# Patient Record
Sex: Male | Born: 1950 | Race: White | Hispanic: No | Marital: Married | Smoking: Former smoker
Health system: Southern US, Community
[De-identification: ages and names within clinical notes are randomized; demographics above are authoritative.]

## PROBLEM LIST (undated history)

## (undated) DIAGNOSIS — N281 Cyst of kidney, acquired: Secondary | ICD-10-CM

## (undated) DIAGNOSIS — G20A1 Parkinson's disease without dyskinesia, without mention of fluctuations: Secondary | ICD-10-CM

## (undated) DIAGNOSIS — D696 Thrombocytopenia, unspecified: Secondary | ICD-10-CM

## (undated) DIAGNOSIS — I499 Cardiac arrhythmia, unspecified: Secondary | ICD-10-CM

## (undated) DIAGNOSIS — I4891 Unspecified atrial fibrillation: Secondary | ICD-10-CM

## (undated) DIAGNOSIS — I82409 Acute embolism and thrombosis of unspecified deep veins of unspecified lower extremity: Secondary | ICD-10-CM

## (undated) DIAGNOSIS — R600 Localized edema: Secondary | ICD-10-CM

## (undated) DIAGNOSIS — I10 Essential (primary) hypertension: Secondary | ICD-10-CM

## (undated) DIAGNOSIS — R0600 Dyspnea, unspecified: Secondary | ICD-10-CM

## (undated) HISTORY — PX: HERNIA REPAIR: SHX51

## (undated) HISTORY — DX: Unspecified atrial fibrillation: I48.91

## (undated) HISTORY — PX: TONSILLECTOMY: SUR1361

## (undated) HISTORY — PX: COLONOSCOPY: SHX174

## (undated) HISTORY — PX: EYE SURGERY: SHX253

---

## 2009-08-03 ENCOUNTER — Ambulatory Visit: Payer: Self-pay | Admitting: Gastroenterology

## 2013-10-17 DIAGNOSIS — Z86718 Personal history of other venous thrombosis and embolism: Secondary | ICD-10-CM | POA: Insufficient documentation

## 2014-12-22 ENCOUNTER — Other Ambulatory Visit: Payer: Self-pay

## 2014-12-22 ENCOUNTER — Encounter: Payer: Self-pay | Admitting: General Surgery

## 2014-12-22 ENCOUNTER — Ambulatory Visit (INDEPENDENT_AMBULATORY_CARE_PROVIDER_SITE_OTHER): Payer: 59 | Admitting: General Surgery

## 2014-12-22 VITALS — BP 136/78 | HR 82 | Resp 14 | Ht 69.0 in | Wt 289.0 lb

## 2014-12-22 DIAGNOSIS — I482 Chronic atrial fibrillation, unspecified: Secondary | ICD-10-CM

## 2014-12-22 DIAGNOSIS — K4090 Unilateral inguinal hernia, without obstruction or gangrene, not specified as recurrent: Secondary | ICD-10-CM

## 2014-12-22 NOTE — Patient Instructions (Signed)

## 2014-12-22 NOTE — Progress Notes (Signed)
Patient ID: Neil Bailey, male   DOB: [birth year], 64 y.o.   MRN: [id number]  Chief Complaint  Patient presents with  . Other    right inguinal hernia    HPI Neil Bailey is a 64 y.o. male for evaluation of possible right inguinal hernia. He first noticed this about 2 months ago. He denies any incidents that may have caused this.  He just noticed that it has felt different in that area. He does report an ache in the area after sitting for a long period of time. He reports no problems with using the bathroom. He has recently gained weight. Last blood work was about 3 months ago.  HPI  Past Medical History  Diagnosis Date  . Atrial fibrillation     Past Surgical History  Procedure Laterality Date  . Eye surgery  05/2014, 06/2014    cataracts  . Colonoscopy  7-8 years ago    [facility]  . Tonsillectomy      History reviewed. No pertinent family history.  Social History History  Substance Use Topics  . Smoking status: Former Smoker -- 28 years    Quit date: 07/11/1986  . Smokeless tobacco: Not on file  . Alcohol Use: 0.0 oz/week    0 Standard drinks or equivalent per week    No Known Allergies  Current Outpatient Prescriptions  Medication Sig Dispense Refill  . aspirin EC 81 MG tablet Take 81 mg by mouth daily.     No current facility-administered medications for this visit.    Review of Systems Review of Systems  Constitutional: Negative.   Respiratory: Negative.   Cardiovascular: Negative.   Gastrointestinal: Negative.     Blood pressure 136/78, pulse 82, resp. rate 14, height 5\' 9"  (1.753 m), weight 289 lb (131.09 kg).  Physical Exam Physical Exam  Constitutional: He is oriented to person, place, and time. He appears well-developed and well-nourished.  Eyes: No scleral icterus.  Neck: Neck supple.  Cardiovascular: Normal rate and normal heart sounds.  An irregularly irregular rhythm present.  Pulmonary/Chest: Effort normal and breath sounds normal.   Abdominal: Soft. Bowel sounds are normal. He exhibits no distension and no mass. There is no hepatomegaly. There is no tenderness. There is no rebound and no guarding.  Can see fullness in right inguinal area, but no hernia palpated  Lymphadenopathy:    He has no cervical adenopathy.  Neurological: He is alert and oriented to person, place, and time.  Skin: Skin is warm and dry.    Data Reviewed Cardiology notes from 48yr ago.  Assessment    History suggestive of a hernia in right inguinal area, but hard to detect on exam, possibly due to patient's body habitus.  Rate controlled atrial fibrillation, not on anticoagulation.      Plan    CT scan of abdomen and pelvis without contrast to assess presence of hernia.   Hernia precautions and incarceration were discussed with the patient. If they develop symptoms of an incarcerated hernia, they were encouraged to seek prompt medical attention.Discussed repair of the hernia using mesh on an outpatient basis depending on CT results. The risk of infection was reviewed. The role of prosthetic mesh to minimize the risk of recurrence was reviewed. Patient instruction given.  Follow up TBA after CT scan    PCP: Dr Ivor Messier 12/22/2014, 2:30 PM

## 2014-12-26 ENCOUNTER — Ambulatory Visit
Admission: RE | Admit: 2014-12-26 | Discharge: 2014-12-26 | Disposition: A | Discharge status: Home / Self Care | Payer: 59 | Source: Ambulatory Visit | Attending: General Surgery | Admitting: General Surgery

## 2014-12-26 DIAGNOSIS — K4090 Unilateral inguinal hernia, without obstruction or gangrene, not specified as recurrent: Secondary | ICD-10-CM | POA: Insufficient documentation

## 2014-12-26 IMAGING — CT CT ABD-PELV W/O CM
2 of 4 series · 17 of 46 positions shown, 19 images · non-contrast
Comparison: None.

CLINICAL DATA: Right groin pain and pelvic pain. Right inguinal
hernia.

EXAM:
CT ABDOMEN AND PELVIS WITHOUT CONTRAST
TECHNIQUE: Multidetector CT imaging of the abdomen and pelvis was performed
following the standard protocol without IV contrast.

[Series 2: routine without · axial · non-contrast · 0.91mm/px · z∈[-1116,-626]mm · 14 of 108 slices shown, 16 images]
[im 5/108  soft-tissue]
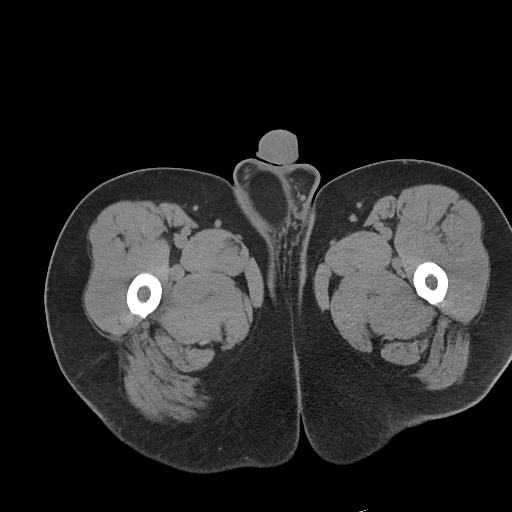
[im 5/108  bone]
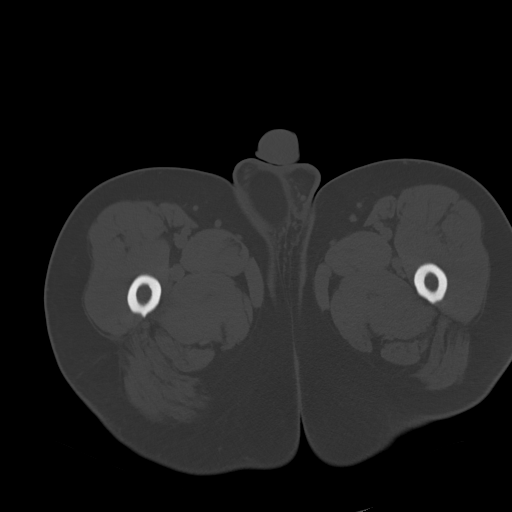
[im 14/108  soft-tissue]
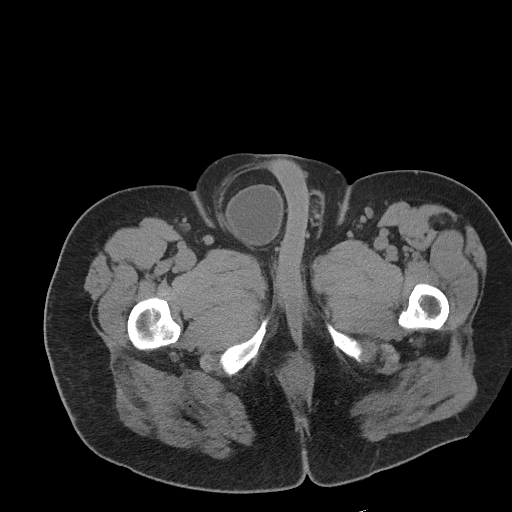
[im 23/108  soft-tissue]
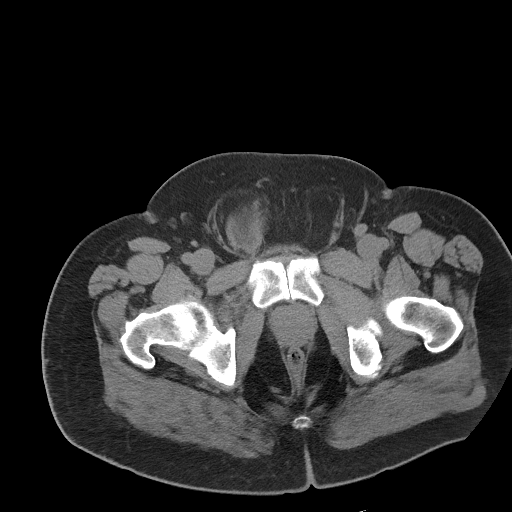
[im 27/108  soft-tissue]
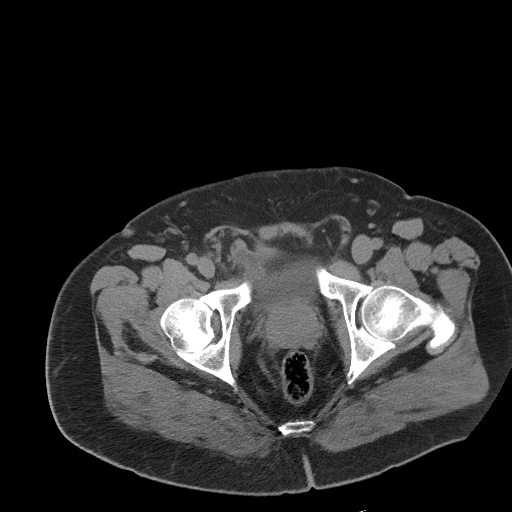
[im 36/108  soft-tissue]
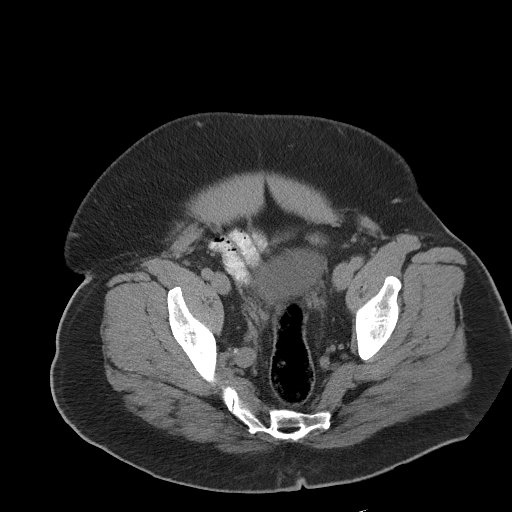
[im 45/108  soft-tissue]
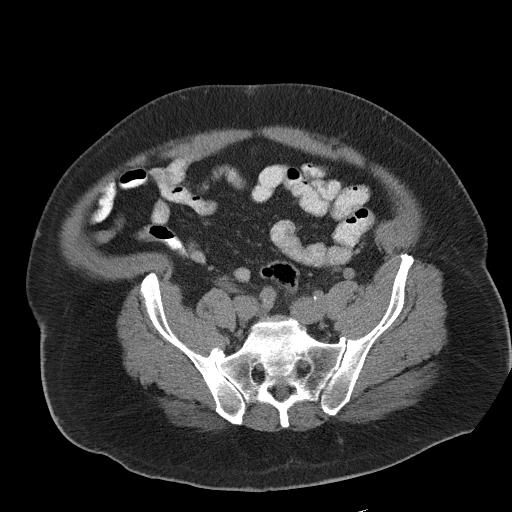
[im 50/108  soft-tissue]
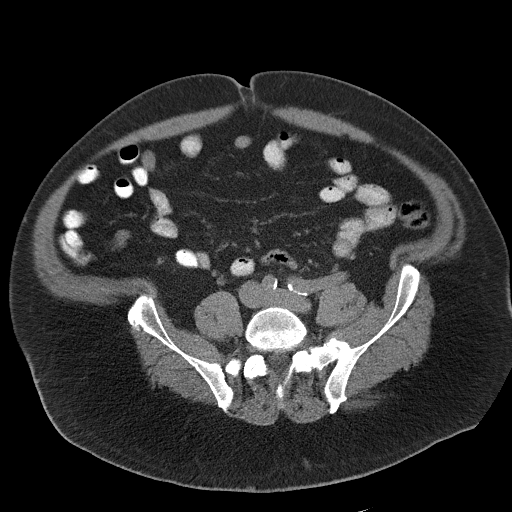
[im 58/108  soft-tissue]
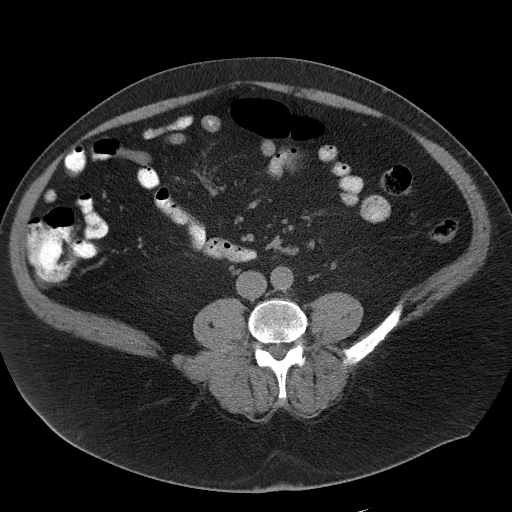
[im 63/108  soft-tissue]
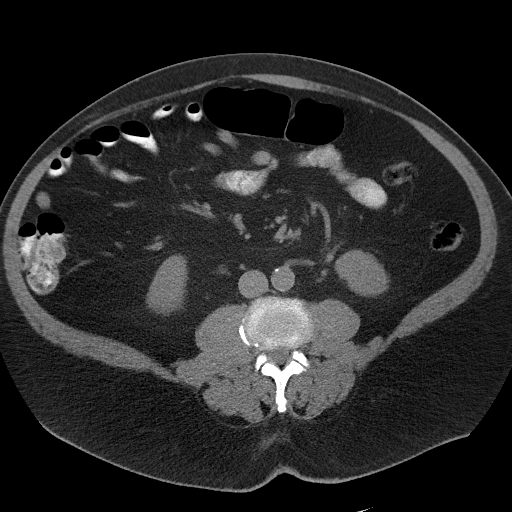
[im 63/108  bone]
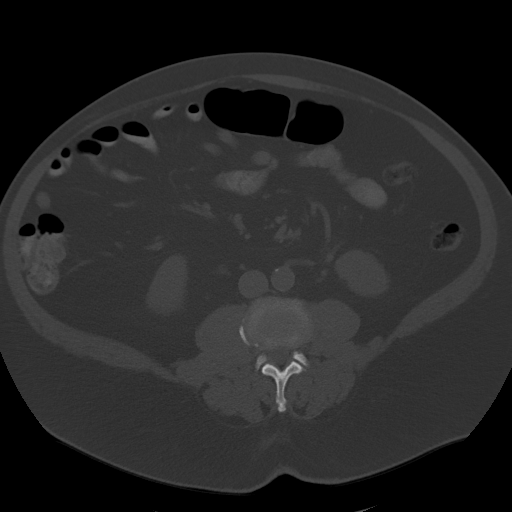
[im 72/108  soft-tissue]
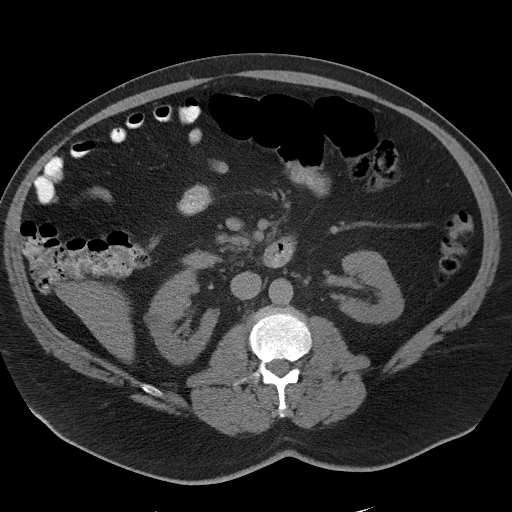
[im 81/108  soft-tissue]
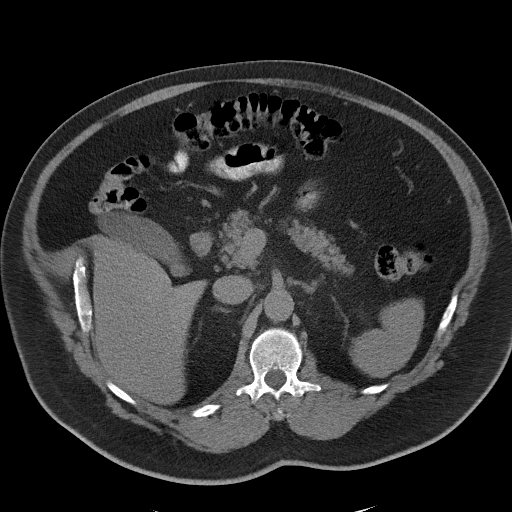
[im 85/108  soft-tissue]
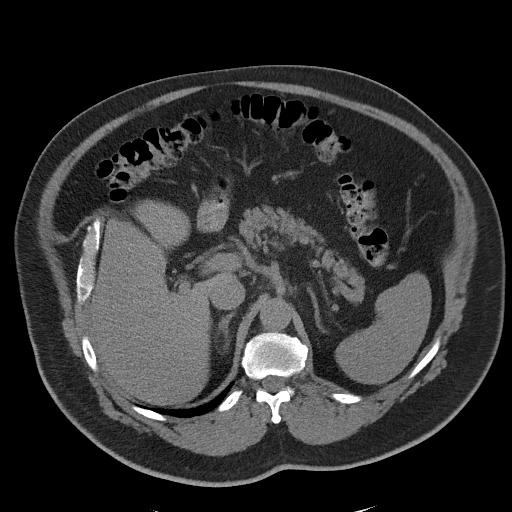
[im 94/108  soft-tissue]
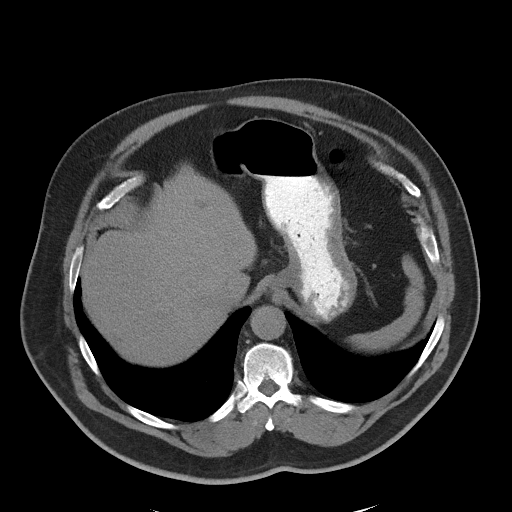
[im 103/108  soft-tissue]
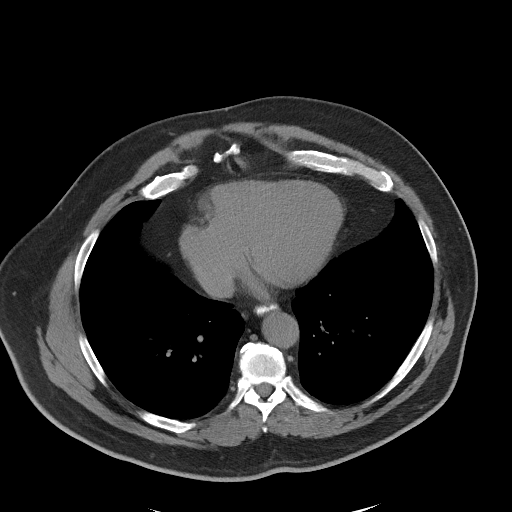

[Series 5: cor routine without · coronal · non-contrast · 0.97mm/px · 3 of 183 slices shown]
[im 61/183  soft-tissue]
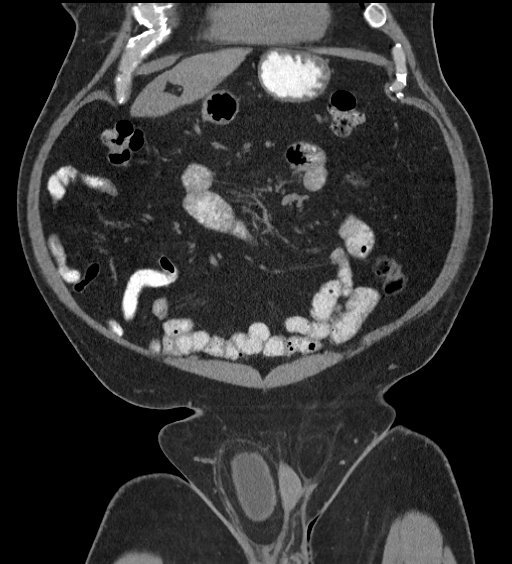
[im 81/183  soft-tissue]
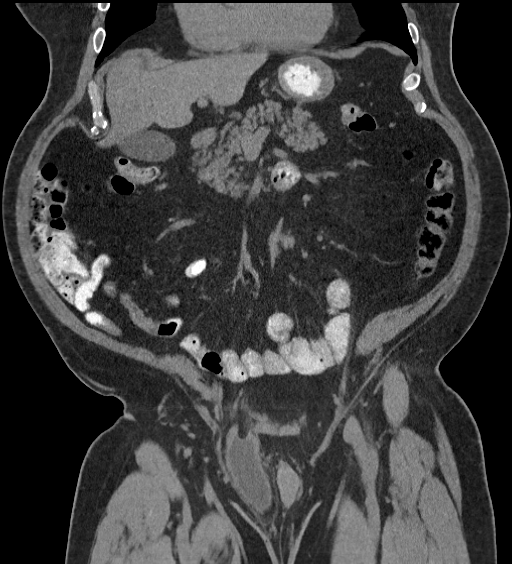
[im 102/183  soft-tissue]
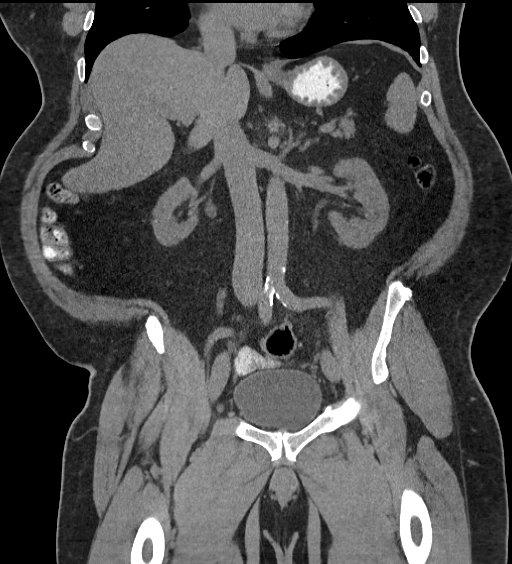

[17 of 46 positions shown; findings below may reference images not displayed]

FINDINGS: Lower chest: Single small blebs at the right lung base. Lung bases
are otherwise clear. Heart size is normal.

Hepatobiliary: Normal.

Pancreas: Normal.

Spleen: Normal.

Adrenals/Urinary Tract: Normal adrenal glands. 15 mm low-density
lesion in the lateral aspect of the mid right kidney consistent with
a cyst. The kidneys are otherwise normal. Ureters are normal. There
is a prominent right inguinal hernia containing the right anterior
lateral aspect of the bladder. The herniated component is 11 cm in
length. There is no bowel in the hernia sac.

Stomach/Bowel: Normal.

Vascular/Lymphatic: Scattered calcifications in the distal abdominal
aorta and iliac arteries.

Reproductive: Benign calcifications in the normal size prostate
gland.

Other: No free air or free fluid.

Musculoskeletal: No osseous abnormality.
IMPRESSION: Prominent right inguinal hernia containing the right anterior
lateral aspect of the bladder.

## 2014-12-30 ENCOUNTER — Telehealth: Payer: Self-pay

## 2014-12-30 NOTE — Telephone Encounter (Signed)
-----   Message from Christene Lye, MD sent at 12/30/2014  8:28 AM EDT ----- CT reviewed showing he has a right inguinal hernia. Need to see pt  to discuss findings and recommendation.

## 2014-12-30 NOTE — Telephone Encounter (Signed)
Notified patient as instructed, patient pleased. Discussed follow-up appointment for 01/04/15 at 3 pm, patient agrees.

## 2015-01-01 ENCOUNTER — Ambulatory Visit (INDEPENDENT_AMBULATORY_CARE_PROVIDER_SITE_OTHER): Payer: 59 | Admitting: General Surgery

## 2015-01-01 ENCOUNTER — Encounter: Payer: Self-pay | Admitting: General Surgery

## 2015-01-01 VITALS — BP 118/78 | HR 68 | Resp 14 | Ht 69.0 in | Wt 289.0 lb

## 2015-01-01 DIAGNOSIS — K4030 Unilateral inguinal hernia, with obstruction, without gangrene, not specified as recurrent: Secondary | ICD-10-CM | POA: Diagnosis not present

## 2015-01-01 NOTE — Progress Notes (Signed)
Patient ID: Neil Bailey, male   DOB: 21-May-1951, 64 y.o.   MRN: [id number] Patient here to discuss the results of his recent CT results showing a right inguinal hernia. He reports the area is unchanged from his last visit.   CT shows half of bladder going into the scrotum. There appears to be some narrowing of the bladder  Patient is experiencing frequent urination, but no other problems related to urination.   Hernia precautions and incarceration were discussed with the patient. If they develop symptoms of an incarcerated hernia, they were encouraged to seek prompt medical attention.  I have recommended repair of the hernia using mesh on an outpatient basis in the near future. The risk of infection was reviewed. The role of prosthetic mesh to minimize the risk of recurrence was reviewed. Patient agreeable.   This patient reports he had labs done through his employment back in March 2016. Patient has a copy of results at home. He was provided with a self addressed stamped envelope to mail copy of these labs to our office.   Cardiac clearance to be obtained from Dr. Saralyn Pilar before we schedule surgery. Patient will be contacted for scheduling once this has been obtained.   PCP:  Emily Filbert, MD

## 2015-01-01 NOTE — Patient Instructions (Addendum)
Open Hernia Repair Open hernia repair is surgery to fix a hernia. A hernia occurs when an internal organ or tissue pushes out through a weak spot in the abdominal wall muscles. Hernias commonly occur in the groin and around the navel. Most hernias tend to get worse over time. Surgery is often done to prevent the hernia from getting bigger, becoming uncomfortable, or becoming an emergency. Emergency surgery may be needed if abdominal contents get stuck in the opening (incarcerated hernia) or the blood supply gets cut off (strangulated hernia). In an open repair, a large cut (incision) is made in the abdomen to perform the surgery. LET [facility] CARE PROVIDER KNOW ABOUT:  Any allergies you have.  All medicines you are taking, including vitamins, herbs, eye drops, creams, and over-the-counter medicines.  Previous problems you or members of your family have had with the use of anesthetics.  Any blood disorders you have.  Previous surgeries you have had.  Medical conditions you have. RISKS AND COMPLICATIONS Generally, this is a safe procedure. However, as with any procedure, complications can occur. Possible complications include:  Infection.  Bleeding.  Nerve injury.  Chronic pain.  The hernia can come back.  Injury to the intestines. BEFORE THE PROCEDURE  Ask your health care provider about changing or stopping any regular medicines. Avoid taking aspirin or blood thinners as directed by your health care provider.  Do noteat or drink anything after midnight the night before surgery.  If you smoke, do not smoke for at least 2 weeks before your surgery.  Do not drink alcohol the day before your surgery.  Let your health care provider know if you develop a cold or any infection before your surgery.  Arrange for someone to drive you home after the procedure or after your hospital stay. Also arrange for someone to help you with activities during recovery. PROCEDURE   Small  monitors will be put on your body. They are used to check your heart, blood pressure, and oxygen level.   An IV access tube will be put into one of your veins. Medicine will be able to flow directly into your body through this IV tube.   You might be given a medicine to help you relax (sedative).   You will be given a medicine to make you sleep (general anesthetic). A breathing tube may be placed into your lungs during the procedure.  A cut (incision) is made over the hernia defect, and the contents are pushed back into the abdomen.  If the hernia is small, stitches may be used to bring the muscle edges back together.  Typically, a surgeon will place a mesh patch made of man-made material (synthetic) to cover the defect. The mesh is sewn to healthy muscle. This reduces the risk of the hernia coming back.  The tissue and skin over the hernia are then closed with stitches or staples.  If the hernia was large, a drain may be left in place to collect excess fluid where the hernia used to be.  Bandages (dressings) are used to cover the incision. AFTER THE PROCEDURE  You will be taken to a recovery area where your progress will be monitored.  If the hernia was small or in the groin (inguinal) region, you will likely be allowed to go home once you are awake, stable, and taking fluids well.  If the hernia was large, you may have to wait for your bowel function to return. You may need to stay in the hospital  for 2-3 days until you can eat and your pain is controlled. A drain may be left in place for 5-7 days. You will be taught how to care for the drain. Document Released: 12/21/2000 Document Revised: 04/17/2013 Document Reviewed: 02/06/2013 [facility] Patient Information 2015 [place], [place]. This information is not intended to replace advice given to you by your health care provider. Make sure you discuss any questions you have with your health care provider.  Please mail lab results back to  the office.

## 2015-01-07 ENCOUNTER — Telehealth: Payer: Self-pay | Admitting: *Deleted

## 2015-01-07 NOTE — Telephone Encounter (Signed)
Spoke with patient today to notify him that we have received cardiac clearance from Dr. Saralyn Pilar' office.   Patient's surgery has been scheduled for 01-19-15 at [facility]. It is okay for patient to continue 81 mg aspirin once daily.

## 2015-01-08 ENCOUNTER — Encounter: Payer: Self-pay | Admitting: General Surgery

## 2015-01-14 ENCOUNTER — Other Ambulatory Visit: Payer: Self-pay | Admitting: General Surgery

## 2015-01-14 ENCOUNTER — Encounter
Admission: RE | Admit: 2015-01-14 | Discharge: 2015-01-14 | Disposition: A | Discharge status: Home / Self Care | Payer: 59 | Source: Ambulatory Visit | Attending: General Surgery | Admitting: General Surgery

## 2015-01-14 DIAGNOSIS — K4030 Unilateral inguinal hernia, with obstruction, without gangrene, not specified as recurrent: Secondary | ICD-10-CM

## 2015-01-14 DIAGNOSIS — I4891 Unspecified atrial fibrillation: Secondary | ICD-10-CM | POA: Diagnosis not present

## 2015-01-14 HISTORY — DX: Acute embolism and thrombosis of unspecified deep veins of unspecified lower extremity: I82.409

## 2015-01-14 NOTE — Patient Instructions (Signed)
  Your procedure is scheduled on: 01-19-15 Report to [place] To find out your arrival time please call [phone] between 1PM - 3PM on 01-16-15 (FRIDAY)  Remember: Instructions that are not followed completely may result in serious medical risk, up to and including death, or upon the discretion of your surgeon and anesthesiologist your surgery may need to be rescheduled.    _X___ 1. Do not eat food or drink liquids after midnight. No gum chewing or hard candies.     _X___ 2. No Alcohol for 24 hours before or after surgery.   ____ 3. Bring all medications with you on the day of surgery if instructed.    __X__ 4. Notify your doctor if there is any change in your medical condition     (cold, fever, infections).     Do not wear jewelry, make-up, hairpins, clips or nail polish.  Do not wear lotions, powders, or perfumes. You may wear deodorant.  Do not shave 48 hours prior to surgery. Men may shave face and neck.  Do not bring valuables to the hospital.    [facility] is not responsible for any belongings or valuables.               Contacts, dentures or bridgework may not be worn into surgery.  Leave your suitcase in the car. After surgery it may be brought to your room.  For patients admitted to the hospital, discharge time is determined by your  treatment team.   Patients discharged the day of surgery will not be allowed to drive home.   Please read over the following fact sheets that you were given:     ____ Take these medicines the morning of surgery with A SIP OF WATER:    1. NONE  2.   3.   4.  5.  6.  ____ Fleet Enema (as directed)   _X___ Use CHG Soap as directed  ____ Use inhalers on the day of surgery  ____ Stop metformin 2 days prior to surgery    ____ Take 1/2 of usual insulin dose the night before surgery and none on the morning of surgery.   ____ Stop Coumadin/Plavix/aspirin-CHECK WITH DR Jamal Collin ABOUT ASPIRIN  ____  Stop Anti-inflammatories-NO NSAIDS OR ASPIRIN PRODUCTS   ____ Stop supplements until after surgery.    ____ Bring C-Pap to the hospital.

## 2015-01-16 DIAGNOSIS — Z6841 Body Mass Index (BMI) 40.0 and over, adult: Secondary | ICD-10-CM | POA: Diagnosis not present

## 2015-01-16 DIAGNOSIS — I4891 Unspecified atrial fibrillation: Secondary | ICD-10-CM | POA: Diagnosis not present

## 2015-01-16 DIAGNOSIS — I252 Old myocardial infarction: Secondary | ICD-10-CM | POA: Diagnosis not present

## 2015-01-16 DIAGNOSIS — Z7982 Long term (current) use of aspirin: Secondary | ICD-10-CM | POA: Diagnosis not present

## 2015-01-16 DIAGNOSIS — Z86718 Personal history of other venous thrombosis and embolism: Secondary | ICD-10-CM | POA: Diagnosis not present

## 2015-01-16 DIAGNOSIS — Z87891 Personal history of nicotine dependence: Secondary | ICD-10-CM | POA: Diagnosis not present

## 2015-01-16 DIAGNOSIS — K4090 Unilateral inguinal hernia, without obstruction or gangrene, not specified as recurrent: Secondary | ICD-10-CM | POA: Diagnosis present

## 2015-01-18 DIAGNOSIS — K4090 Unilateral inguinal hernia, without obstruction or gangrene, not specified as recurrent: Secondary | ICD-10-CM | POA: Diagnosis not present

## 2015-01-19 ENCOUNTER — Ambulatory Visit: Payer: 59 | Admitting: Anesthesiology

## 2015-01-19 ENCOUNTER — Encounter: Payer: Self-pay | Admitting: *Deleted

## 2015-01-19 ENCOUNTER — Ambulatory Visit
Admission: RE | Admit: 2015-01-19 | Discharge: 2015-01-19 | Disposition: A | Discharge status: Home / Self Care | Payer: 59 | Source: Ambulatory Visit | Attending: General Surgery | Admitting: General Surgery

## 2015-01-19 ENCOUNTER — Encounter
Admission: RE | Disposition: A | Discharge status: Home / Self Care | Payer: Self-pay | Source: Ambulatory Visit | Attending: General Surgery

## 2015-01-19 DIAGNOSIS — K4090 Unilateral inguinal hernia, without obstruction or gangrene, not specified as recurrent: Secondary | ICD-10-CM | POA: Insufficient documentation

## 2015-01-19 DIAGNOSIS — Z7982 Long term (current) use of aspirin: Secondary | ICD-10-CM | POA: Insufficient documentation

## 2015-01-19 DIAGNOSIS — I252 Old myocardial infarction: Secondary | ICD-10-CM | POA: Insufficient documentation

## 2015-01-19 DIAGNOSIS — Z6841 Body Mass Index (BMI) 40.0 and over, adult: Secondary | ICD-10-CM | POA: Insufficient documentation

## 2015-01-19 DIAGNOSIS — Z86718 Personal history of other venous thrombosis and embolism: Secondary | ICD-10-CM | POA: Insufficient documentation

## 2015-01-19 DIAGNOSIS — K4030 Unilateral inguinal hernia, with obstruction, without gangrene, not specified as recurrent: Secondary | ICD-10-CM

## 2015-01-19 DIAGNOSIS — I4891 Unspecified atrial fibrillation: Secondary | ICD-10-CM | POA: Insufficient documentation

## 2015-01-19 DIAGNOSIS — Z87891 Personal history of nicotine dependence: Secondary | ICD-10-CM | POA: Insufficient documentation

## 2015-01-19 HISTORY — PX: INGUINAL HERNIA REPAIR: SHX194

## 2015-01-19 SURGERY — REPAIR, HERNIA, INGUINAL, INCARCERATED
Anesthesia: General | Laterality: Right | Wound class: Clean

## 2015-01-19 MED ORDER — FENTANYL CITRATE (PF) 100 MCG/2ML IJ SOLN
25.0000 ug | INTRAMUSCULAR | Status: DC | PRN
  Administered 2015-01-19 (×4): 25 ug via INTRAVENOUS

## 2015-01-19 MED ORDER — LIDOCAINE HCL (CARDIAC) 20 MG/ML IV SOLN
INTRAVENOUS | Status: DC | PRN
  Administered 2015-01-19 (×2): 100 mg via INTRAVENOUS

## 2015-01-19 MED ORDER — OXYCODONE-ACETAMINOPHEN 5-325 MG PO TABS: 1.0000 | ORAL_TABLET | ORAL | Status: DC | PRN

## 2015-01-19 MED ORDER — BUPIVACAINE HCL (PF) 0.5 % IJ SOLN
INTRAMUSCULAR | Status: AC
  Filled 2015-01-19: qty 30

## 2015-01-19 MED ORDER — OXYCODONE HCL 5 MG PO TABS
ORAL_TABLET | ORAL | Status: AC
  Administered 2015-01-19: 5 mg via ORAL
  Filled 2015-01-19: qty 1

## 2015-01-19 MED ORDER — PROPOFOL 10 MG/ML IV BOLUS
INTRAVENOUS | Status: DC | PRN
  Administered 2015-01-19: 200 mg via INTRAVENOUS

## 2015-01-19 MED ORDER — FENTANYL CITRATE (PF) 100 MCG/2ML IJ SOLN
INTRAMUSCULAR | Status: DC | PRN
  Administered 2015-01-19: 250 ug via INTRAVENOUS

## 2015-01-19 MED ORDER — ONDANSETRON HCL 4 MG/2ML IJ SOLN
INTRAMUSCULAR | Status: DC | PRN
  Administered 2015-01-19: 4 mg via INTRAVENOUS

## 2015-01-19 MED ORDER — FAMOTIDINE 20 MG PO TABS
20.0000 mg | ORAL_TABLET | Freq: Once | ORAL | Status: AC
  Administered 2015-01-19: 20 mg via ORAL

## 2015-01-19 MED ORDER — OXYCODONE HCL 5 MG/5ML PO SOLN: 5.0000 mg | Freq: Once | ORAL | Status: AC | PRN

## 2015-01-19 MED ORDER — GLYCOPYRROLATE 0.2 MG/ML IJ SOLN
INTRAMUSCULAR | Status: DC | PRN
  Administered 2015-01-19: .5 mg via INTRAVENOUS

## 2015-01-19 MED ORDER — LIDOCAINE HCL (PF) 1 % IJ SOLN
INTRAMUSCULAR | Status: AC
  Filled 2015-01-19: qty 30

## 2015-01-19 MED ORDER — MIDAZOLAM HCL 2 MG/2ML IJ SOLN
INTRAMUSCULAR | Status: DC | PRN
  Administered 2015-01-19: 1 mg via INTRAVENOUS

## 2015-01-19 MED ORDER — OXYCODONE HCL 5 MG PO TABS
5.0000 mg | ORAL_TABLET | Freq: Once | ORAL | Status: AC | PRN
  Administered 2015-01-19: 5 mg via ORAL

## 2015-01-19 MED ORDER — NEOSTIGMINE METHYLSULFATE 10 MG/10ML IV SOLN
INTRAVENOUS | Status: DC | PRN
  Administered 2015-01-19: 3 mg via INTRAVENOUS

## 2015-01-19 MED ORDER — ROCURONIUM BROMIDE 100 MG/10ML IV SOLN
INTRAVENOUS | Status: DC | PRN
  Administered 2015-01-19: 10 mg via INTRAVENOUS
  Administered 2015-01-19: 40 mg via INTRAVENOUS

## 2015-01-19 MED ORDER — LACTATED RINGERS IV SOLN
INTRAVENOUS | Status: DC | PRN
  Administered 2015-01-19: 14:00:00 via INTRAVENOUS

## 2015-01-19 MED ORDER — CEFAZOLIN SODIUM-DEXTROSE 2-3 GM-% IV SOLR
INTRAVENOUS | Status: AC
  Administered 2015-01-19: 2 g via INTRAVENOUS
  Filled 2015-01-19: qty 50

## 2015-01-19 MED ORDER — CEFAZOLIN SODIUM-DEXTROSE 2-3 GM-% IV SOLR
2.0000 g | INTRAVENOUS | Status: AC
  Administered 2015-01-19: 2 g via INTRAVENOUS

## 2015-01-19 MED ORDER — ACETAMINOPHEN 10 MG/ML IV SOLN
INTRAVENOUS | Status: AC
  Filled 2015-01-19: qty 100

## 2015-01-19 MED ORDER — FAMOTIDINE 20 MG PO TABS
ORAL_TABLET | ORAL | Status: AC
  Administered 2015-01-19: 20 mg via ORAL
  Filled 2015-01-19: qty 1

## 2015-01-19 MED ORDER — ACETAMINOPHEN 10 MG/ML IV SOLN
INTRAVENOUS | Status: DC | PRN
  Administered 2015-01-19: 1000 mg via INTRAVENOUS

## 2015-01-19 MED ORDER — SUCCINYLCHOLINE CHLORIDE 20 MG/ML IJ SOLN
INTRAMUSCULAR | Status: DC | PRN
  Administered 2015-01-19: 120 mg via INTRAVENOUS

## 2015-01-19 MED ORDER — CHLORHEXIDINE GLUCONATE 4 % EX LIQD: 1.0000 "application " | Freq: Once | CUTANEOUS | Status: DC

## 2015-01-19 MED ORDER — FENTANYL CITRATE (PF) 100 MCG/2ML IJ SOLN
INTRAMUSCULAR | Status: AC
  Administered 2015-01-19: 25 ug via INTRAVENOUS
  Filled 2015-01-19: qty 2

## 2015-01-19 MED ORDER — LACTATED RINGERS IV SOLN
Freq: Once | INTRAVENOUS | Status: AC
  Administered 2015-01-19: 12:00:00 via INTRAVENOUS

## 2015-01-19 MED ORDER — LIDOCAINE HCL 1 % IJ SOLN
INTRAMUSCULAR | Status: DC | PRN
  Administered 2015-01-19: 20 mL via INTRAMUSCULAR

## 2015-01-19 MED ORDER — DILTIAZEM HCL 25 MG/5ML IV SOLN
INTRAVENOUS | Status: DC | PRN
  Administered 2015-01-19: 5 mg via INTRAVENOUS

## 2015-01-19 SURGICAL SUPPLY — 28 items
BLADE SURG 15 STRL SS SAFETY (BLADE) ×3 IMPLANT
CANISTER SUCT 1200ML W/VALVE (MISCELLANEOUS) ×3 IMPLANT
CHLORAPREP W/TINT 26ML (MISCELLANEOUS) ×3 IMPLANT
DECANTER SPIKE VIAL GLASS SM (MISCELLANEOUS) ×6 IMPLANT
DRAIN PENROSE 1/4X12 LTX (DRAIN) ×3 IMPLANT
DRAPE LAPAROTOMY 100X77 ABD (DRAPES) ×3 IMPLANT
GLOVE BIO SURGEON STRL SZ7 (GLOVE) ×21 IMPLANT
GOWN STRL REUS W/ TWL LRG LVL3 (GOWN DISPOSABLE) ×4 IMPLANT
GOWN STRL REUS W/TWL LRG LVL3 (GOWN DISPOSABLE) ×8
KIT RM TURNOVER STRD PROC AR (KITS) ×3 IMPLANT
LABEL OR SOLS (LABEL) ×3 IMPLANT
LIQUID BAND (GAUZE/BANDAGES/DRESSINGS) ×3 IMPLANT
MESH PARIETEX PROGRIP RIGHT (Mesh General) ×3 IMPLANT
NDL HPO THNWL 1X22GA REG BVL (NEEDLE) ×1 IMPLANT
NDL SAFETY 25GX1.5 (NEEDLE) IMPLANT
NEEDLE SAFETY 22GX1 (NEEDLE) ×2
NS IRRIG 500ML POUR BTL (IV SOLUTION) ×3 IMPLANT
PACK BASIN MINOR ARMC (MISCELLANEOUS) ×3 IMPLANT
PAD GROUND ADULT SPLIT (MISCELLANEOUS) ×3 IMPLANT
SUT PDS 2-0 27IN (SUTURE) IMPLANT
SUT VIC AB 2-0 SH 27 (SUTURE) ×2
SUT VIC AB 2-0 SH 27XBRD (SUTURE) ×1 IMPLANT
SUT VIC AB 3-0 54X BRD REEL (SUTURE) ×1 IMPLANT
SUT VIC AB 3-0 BRD 54 (SUTURE) ×2
SUT VIC AB 3-0 SH 27 (SUTURE) ×2
SUT VIC AB 3-0 SH 27X BRD (SUTURE) ×1 IMPLANT
SUT VIC AB 4-0 FS2 27 (SUTURE) ×3 IMPLANT
SYR CONTROL 10ML (SYRINGE) ×3 IMPLANT

## 2015-01-19 NOTE — Anesthesia Preprocedure Evaluation (Addendum)
Anesthesia Evaluation  Patient identified by MRN, date of birth, ID band Patient awake    Reviewed: Allergy & Precautions, H&P , NPO status , Patient's Chart, lab work & pertinent test results, reviewed documented beta blocker date and time   Airway Mallampati: III  TM Distance: >3 FB Neck ROM: full    Dental no notable dental hx. (+) Teeth Intact   Pulmonary former smoker,  breath sounds clear to auscultation  Pulmonary exam normal       Cardiovascular - Past MI Normal cardiovascular exam+ dysrhythmias Atrial Fibrillation Rhythm:regular Rate:Normal     Neuro/Psych negative neurological ROS  negative psych ROS   GI/Hepatic negative GI ROS, Neg liver ROS,   Endo/Other  negative endocrine ROS  Renal/GU negative Renal ROS  negative genitourinary   Musculoskeletal   Abdominal   Peds  Hematology negative hematology ROS (+)   Anesthesia Other Findings Past Medical History:   Atrial fibrillation                                          DVT (deep venous thrombosis)                                Morbid Obesity BMI 42  Reproductive/Obstetrics negative OB ROS                            Anesthesia Physical Anesthesia Plan  ASA: III  Anesthesia Plan: General ETT   Post-op Pain Management:    Induction:   Airway Management Planned:   Additional Equipment:   Intra-op Plan:   Post-operative Plan:   Informed Consent:   Plan Discussed with: Anesthesiologist, CRNA and Surgeon  Anesthesia Plan Comments:        Anesthesia Quick Evaluation

## 2015-01-19 NOTE — Op Note (Signed)
Preop diagnosis right inguinal hernia direct   Post op diagnosis: Same  Operation: Repair right inguinal hernia with mesh  Surgeon: S.G.Frida Wahlstrom  Assistant:     Anesthesia: Gen.  Complications: None  EBL: Minimal  Drains: None  Description: Patient was put to sleep in the supine position the operating table. The right groin area was prepped and draped sterile field. Timeout was performed. By prior CT evaluation the patient was noted to have a significant hernia the urinary bladder. An open repair was therefore planned. Skin incision was made along the inguinal canal and the medial portion. This was deepened through the layers down the external oblique and bleeding was controlled with cautery and ligatures of 3-0 Vicryl. The external oblique was opened along the line of its fibers. There was noted the patient had a very large fullness extending from the inguinal canal going through the external ring area. With careful dissection the hernia protrusion which was primarily a direct hernia was reduced from the scrotal area back and subsequently freed from the surrounding cord structures. After this was done the cord structures were inspected lipoma of the cord was identified which was excised and ligated with 3-0 Vicryl. The hernia containing the urinary bladder was then pushed back into the abdominal cavity through the posterior wall of the inguinal canal and this was held in place by reapproximating the transversalis fascia with a running 2-0 PDS stitch. After the hernia was satisfactorily reduced the posterior wall was reinforced with a Progrip mesh. This was placed around the cord and incorporated along the posterior wall. Medial and was tacked to the pubic tubercle with 2-0 PDS stitch. Wound was irrigated , the external oblique was closed with a running 2-0 PDS. The subcutaneous tissue was closed in 2 layers with 3-0 Vicryl 20 mL of Percent Marcaine was instilled for postop analgesia skin was  approximated with subcuticular 4-0 Vicryl reinforced with liqui  Ban. Patient subsequently was extubated and returned recovery room stable condition

## 2015-01-19 NOTE — Transfer of Care (Signed)
Immediate Anesthesia Transfer of Care Note  Patient: Neil Bailey  Procedure(s) Performed: Procedure(s): RIGHT INCARCERATED INGUINAL HERNIA REPAIR (Right)  Patient Location: PACU  Anesthesia Type:General  Level of Consciousness: awake, alert , oriented and patient cooperative  Airway & Oxygen Therapy: Patient Spontanous Breathing and Patient connected to nasal cannula oxygen  Post-op Assessment: Report given to RN and Post -op Vital signs reviewed and stable  Post vital signs: Reviewed and stable  Last Vitals:  Filed Vitals:   01/19/15 1541  BP: 135/86  Pulse: 96  Temp: 36.7 C  Resp:     Complications: No apparent anesthesia complications

## 2015-01-19 NOTE — Interval H&P Note (Signed)
History and Physical Interval Note:  01/19/2015 1:53 PM  Neil Bailey  has presented today for surgery, with the diagnosis of RIH WITH OBSTRUCTIONS  The various methods of treatment have been discussed with the patient and family. After consideration of risks, benefits and other options for treatment, the patient has consented to  Procedure(s): HERNIA REPAIR INGUINAL INCARCERATED (Right) as a surgical intervention .  The patient's history has been reviewed, patient examined, no change in status, stable for surgery.  I have reviewed the patient's chart and labs.  Questions were answered to the patient's satisfaction.     SANKAR,SEEPLAPUTHUR G

## 2015-01-19 NOTE — Discharge Instructions (Signed)
Inguinal Hernia, Adult  Care After Refer to this sheet in the next few weeks. These discharge instructions provide you with general information on caring for yourself after you leave the hospital. Your caregiver may also give you specific instructions. Your treatment has been planned according to the most current medical practices available, but unavoidable complications sometimes occur. If you have any problems or questions after discharge, please call your caregiver. HOME CARE INSTRUCTIONS  Put ice on the operative site.  Put ice in a plastic bag.  Place a towel between your skin and the bag.  Leave the ice on for 15-20 minutes at a time, 03-04 times a day while awake.  Change bandages (dressings) as directed.  Keep the wound dry and clean. The wound may be washed gently with soap and water. Gently blot or dab the wound dry. It is okay to take showers 24 to 48 hours after surgery. Do not take baths, use swimming pools, or use hot tubs for 10 days, or as directed by your caregiver.  Only take over-the-counter or prescription medicines for pain, discomfort, or fever as directed by your caregiver.  Continue your normal diet as directed.  Do not lift anything more than 10 pounds or play contact sports for 3 weeks, or as directed. SEEK MEDICAL CARE IF:  There is redness, swelling, or increasing pain in the wound.  There is fluid (pus) coming from the wound.  There is drainage from a wound lasting longer than 1 day.  You have an oral temperature above 102 F (38.9 C).  You notice a bad smell coming from the wound or dressing.  The wound breaks open after the stitches (sutures) have been removed.  You notice increasing pain in the shoulders (shoulder strap areas).  You develop dizzy episodes or fainting while standing.  You feel sick to your stomach (nauseous) or throw up (vomit). SEEK IMMEDIATE MEDICAL CARE IF:  You develop a rash.  You have difficulty breathing.  You  develop a reaction or have side effects to medicines you were given. MAKE SURE YOU:   Understand these instructions.  Will watch your condition.  Will get help right away if you are not doing well or get worse. Document Released: 07/28/2006 Document Revised: 09/19/2011 Document Reviewed: 05/27/2009 [facility] Patient Information 2015 [place], [place]. This information is not intended to replace advice given to you by your health care provider. Make sure you discuss any questions you have with your health care provider. AMBULATORY SURGERY  DISCHARGE INSTRUCTIONS   1) The drugs that you were given will stay in your system until tomorrow so for the next 24 hours you should not:  A) Drive an automobile B) Make any legal decisions C) Drink any alcoholic beverage   2) You may resume regular meals tomorrow.  Today it is better to start with liquids and gradually work up to solid foods.  You may eat anything you prefer, but it is better to start with liquids, then soup and crackers, and gradually work up to solid foods.   3) Please notify your doctor immediately if you have any unusual bleeding, trouble breathing, redness and pain at the surgery site, drainage, fever, or pain not relieved by medication.    4) Additional Instructions:    Please contact your physician with any problems or Same Day Surgery at [phone], Monday through Friday 6 am to 4 pm, or [place] [facility] number at [phone].

## 2015-01-19 NOTE — Anesthesia Postprocedure Evaluation (Signed)
  Anesthesia Post-op Note  Patient: Neil Bailey  Procedure(s) Performed: Procedure(s): RIGHT INCARCERATED INGUINAL HERNIA REPAIR (Right)  Anesthesia type:General ETT  Patient location: PACU  Post pain: Pain level controlled  Post assessment: Post-op Vital signs reviewed, Patient's Cardiovascular Status Stable, Respiratory Function Stable, Patent Airway and No signs of Nausea or vomiting  Post vital signs: Reviewed and stable  Last Vitals:  Filed Vitals:   01/19/15 1625  BP: 112/92  Pulse: 52  Temp:   Resp: 18    Level of consciousness: awake, alert  and patient cooperative  Complications: No apparent anesthesia complications

## 2015-01-19 NOTE — H&P (View-Only) (Signed)
Patient ID: Neil Bailey, male   DOB: [birth year], 64 y.o.   MRN: [id number]  Chief Complaint  Patient presents with  . Other    right inguinal hernia    HPI Neil Bailey is a 64 y.o. male for evaluation of possible right inguinal hernia. He first noticed this about 2 months ago. He denies any incidents that may have caused this.  He just noticed that it has felt different in that area. He does report an ache in the area after sitting for a long period of time. He reports no problems with using the bathroom. He has recently gained weight. Last blood work was about 3 months ago.  HPI  Past Medical History  Diagnosis Date  . Atrial fibrillation     Past Surgical History  Procedure Laterality Date  . Eye surgery  05/2014, 06/2014    cataracts  . Colonoscopy  7-8 years ago    [facility]  . Tonsillectomy      History reviewed. No pertinent family history.  Social History History  Substance Use Topics  . Smoking status: Former Smoker -- 28 years    Quit date: 07/11/1986  . Smokeless tobacco: Not on file  . Alcohol Use: 0.0 oz/week    0 Standard drinks or equivalent per week    No Known Allergies  Current Outpatient Prescriptions  Medication Sig Dispense Refill  . aspirin EC 81 MG tablet Take 81 mg by mouth daily.     No current facility-administered medications for this visit.    Review of Systems Review of Systems  Constitutional: Negative.   Respiratory: Negative.   Cardiovascular: Negative.   Gastrointestinal: Negative.     Blood pressure 136/78, pulse 82, resp. rate 14, height 5\' 9"  (1.753 m), weight 289 lb (131.09 kg).  Physical Exam Physical Exam  Constitutional: He is oriented to person, place, and time. He appears well-developed and well-nourished.  Eyes: No scleral icterus.  Neck: Neck supple.  Cardiovascular: Normal rate and normal heart sounds.  An irregularly irregular rhythm present.  Pulmonary/Chest: Effort normal and breath sounds normal.   Abdominal: Soft. Bowel sounds are normal. He exhibits no distension and no mass. There is no hepatomegaly. There is no tenderness. There is no rebound and no guarding.  Can see fullness in right inguinal area, but no hernia palpated  Lymphadenopathy:    He has no cervical adenopathy.  Neurological: He is alert and oriented to person, place, and time.  Skin: Skin is warm and dry.    Data Reviewed Cardiology notes from 66yr ago.  Assessment    History suggestive of a hernia in right inguinal area, but hard to detect on exam, possibly due to patient's body habitus.  Rate controlled atrial fibrillation, not on anticoagulation.      Plan    CT scan of abdomen and pelvis without contrast to assess presence of hernia.   Hernia precautions and incarceration were discussed with the patient. If they develop symptoms of an incarcerated hernia, they were encouraged to seek prompt medical attention.Discussed repair of the hernia using mesh on an outpatient basis depending on CT results. The risk of infection was reviewed. The role of prosthetic mesh to minimize the risk of recurrence was reviewed. Patient instruction given.  Follow up TBA after CT scan    PCP: Dr Ivor Messier 12/22/2014, 2:30 PM

## 2015-01-20 ENCOUNTER — Encounter: Payer: Self-pay | Admitting: General Surgery

## 2015-01-26 ENCOUNTER — Ambulatory Visit (INDEPENDENT_AMBULATORY_CARE_PROVIDER_SITE_OTHER): Payer: 59 | Admitting: General Surgery

## 2015-01-26 ENCOUNTER — Encounter: Payer: Self-pay | Admitting: General Surgery

## 2015-01-26 VITALS — BP 132/74 | HR 68 | Resp 14 | Ht 69.0 in | Wt 284.0 lb

## 2015-01-26 DIAGNOSIS — K4030 Unilateral inguinal hernia, with obstruction, without gangrene, not specified as recurrent: Secondary | ICD-10-CM

## 2015-01-26 NOTE — Progress Notes (Signed)
Patient ID: Neil Bailey, male   DOB: 06-19-1951, 64 y.o.   MRN: [id number] Here for a post op from a right inguinal hernia repair done on . He reports that he started having swelling of the scrotum on 01/22/15. He states that this has progressed and is now very swollen making it difficult to walk and very painful. He has been using ice to the area. He states that the incision area is only a little tender. He is using the bathroom without difficulty.  Moderate swelling extending into the scrotum likely from fluid in residual space after hernia was reduced. No sign of infection, incision looks clean and intact.  Pt advised. Asked that he elevate the scrotum on  towels when sitting or lying down. Also use of an athletic support may help     PCP: Dr Emily Filbert

## 2015-01-26 NOTE — Patient Instructions (Signed)
Wear an athletic support for comfort. Elevate the area continue using cold compresses.

## 2015-01-28 ENCOUNTER — Ambulatory Visit: Payer: 59 | Admitting: General Surgery

## 2015-02-03 ENCOUNTER — Encounter: Payer: Self-pay | Admitting: General Surgery

## 2015-02-03 ENCOUNTER — Ambulatory Visit (INDEPENDENT_AMBULATORY_CARE_PROVIDER_SITE_OTHER): Payer: 59 | Admitting: General Surgery

## 2015-02-03 VITALS — BP 118/72 | HR 64 | Resp 16 | Ht 69.0 in | Wt 287.0 lb

## 2015-02-03 DIAGNOSIS — K4030 Unilateral inguinal hernia, with obstruction, without gangrene, not specified as recurrent: Secondary | ICD-10-CM

## 2015-02-03 NOTE — Progress Notes (Signed)
This is a 64 year old male here today for his post op right inguinal hernia repair done on 01/19/15. Patient states he is doing well, he is just still swollen.  Abdomen soft, hernia repair intact. Moderate swelling in scrotum, no sign of infection. Pt reassured about the scrotal swelling. Hopefully will resolve on it's own  Follow up in 3 weeks. The patient is aware to call back for any questions or concerns.   PCP:  Emily Filbert

## 2015-02-03 NOTE — Patient Instructions (Addendum)
The patient is aware to call back for any questions or concerns. Follow up in 3 weeks. 

## 2015-02-25 ENCOUNTER — Encounter: Payer: Self-pay | Admitting: General Surgery

## 2015-02-25 ENCOUNTER — Ambulatory Visit (INDEPENDENT_AMBULATORY_CARE_PROVIDER_SITE_OTHER): Payer: 59 | Admitting: General Surgery

## 2015-02-25 VITALS — BP 120/80 | HR 77 | Resp 13 | Ht 69.0 in | Wt 283.0 lb

## 2015-02-25 DIAGNOSIS — K4030 Unilateral inguinal hernia, with obstruction, without gangrene, not specified as recurrent: Secondary | ICD-10-CM

## 2015-02-25 NOTE — Patient Instructions (Signed)
Call office in one month if swelling in the scrotum has not improved or sooner if symptoms worsen.  Follow up as needed.

## 2015-02-25 NOTE — Progress Notes (Signed)
Patient ID: Neil Bailey, male   DOB: January 26, 1951, 65 y.o.   MRN: [id number] This is a 64 year old male here today for his post op right inguinal hernia repair done on 01/19/15. Patient states he is doing well. He has returned to work and has not had any problems. Pain is controlled. He reports that he still has swelling in his scrotum.   Incision right inguinal site is clean and healed. Hernia repair is fully intact. Mild swelling is noted in the scrotum bilaterally, mostly anterior in the form of edema. In general, this is better than it was 4 weeks ago.  If symptoms do not improve in 4 weeks, recommend to see urologist.   PCP: Emily Filbert

## 2015-12-29 DIAGNOSIS — N509 Disorder of male genital organs, unspecified: Secondary | ICD-10-CM | POA: Diagnosis not present

## 2015-12-29 DIAGNOSIS — Z0000 Encounter for general adult medical examination without abnormal findings: Secondary | ICD-10-CM | POA: Diagnosis not present

## 2015-12-30 ENCOUNTER — Other Ambulatory Visit: Payer: Self-pay | Admitting: Internal Medicine

## 2015-12-30 DIAGNOSIS — N5089 Other specified disorders of the male genital organs: Secondary | ICD-10-CM

## 2016-01-28 ENCOUNTER — Ambulatory Visit
Admission: RE | Admit: 2016-01-28 | Discharge: 2016-01-28 | Disposition: A | Discharge status: Home / Self Care | Payer: Commercial Managed Care - HMO | Source: Ambulatory Visit | Attending: Internal Medicine | Admitting: Internal Medicine

## 2016-01-28 DIAGNOSIS — N509 Disorder of male genital organs, unspecified: Secondary | ICD-10-CM | POA: Diagnosis not present

## 2016-01-28 DIAGNOSIS — N433 Hydrocele, unspecified: Secondary | ICD-10-CM | POA: Insufficient documentation

## 2016-01-28 DIAGNOSIS — N5089 Other specified disorders of the male genital organs: Secondary | ICD-10-CM

## 2016-02-15 DIAGNOSIS — I480 Paroxysmal atrial fibrillation: Secondary | ICD-10-CM | POA: Diagnosis not present

## 2016-02-15 DIAGNOSIS — Z86718 Personal history of other venous thrombosis and embolism: Secondary | ICD-10-CM | POA: Diagnosis not present

## 2016-02-25 DIAGNOSIS — H26493 Other secondary cataract, bilateral: Secondary | ICD-10-CM | POA: Diagnosis not present

## 2016-02-25 DIAGNOSIS — Z961 Presence of intraocular lens: Secondary | ICD-10-CM | POA: Diagnosis not present

## 2016-09-01 DIAGNOSIS — I480 Paroxysmal atrial fibrillation: Secondary | ICD-10-CM | POA: Diagnosis not present

## 2016-09-01 DIAGNOSIS — Z86718 Personal history of other venous thrombosis and embolism: Secondary | ICD-10-CM | POA: Diagnosis not present

## 2016-10-18 DIAGNOSIS — S8011XA Contusion of right lower leg, initial encounter: Secondary | ICD-10-CM | POA: Diagnosis not present

## 2016-10-18 DIAGNOSIS — L0390 Cellulitis, unspecified: Secondary | ICD-10-CM | POA: Diagnosis not present

## 2016-11-23 DIAGNOSIS — I480 Paroxysmal atrial fibrillation: Secondary | ICD-10-CM | POA: Diagnosis not present

## 2016-11-30 DIAGNOSIS — Z5181 Encounter for therapeutic drug level monitoring: Secondary | ICD-10-CM | POA: Diagnosis not present

## 2016-11-30 DIAGNOSIS — Z86718 Personal history of other venous thrombosis and embolism: Secondary | ICD-10-CM | POA: Diagnosis not present

## 2016-11-30 DIAGNOSIS — Z7901 Long term (current) use of anticoagulants: Secondary | ICD-10-CM | POA: Diagnosis not present

## 2016-11-30 DIAGNOSIS — I480 Paroxysmal atrial fibrillation: Secondary | ICD-10-CM | POA: Diagnosis not present

## 2016-12-22 DIAGNOSIS — Z5181 Encounter for therapeutic drug level monitoring: Secondary | ICD-10-CM | POA: Diagnosis not present

## 2016-12-22 DIAGNOSIS — R739 Hyperglycemia, unspecified: Secondary | ICD-10-CM | POA: Diagnosis not present

## 2016-12-22 DIAGNOSIS — Z125 Encounter for screening for malignant neoplasm of prostate: Secondary | ICD-10-CM | POA: Diagnosis not present

## 2016-12-22 DIAGNOSIS — Z7901 Long term (current) use of anticoagulants: Secondary | ICD-10-CM | POA: Diagnosis not present

## 2016-12-22 DIAGNOSIS — Z0000 Encounter for general adult medical examination without abnormal findings: Secondary | ICD-10-CM | POA: Diagnosis not present

## 2016-12-22 DIAGNOSIS — Z79899 Other long term (current) drug therapy: Secondary | ICD-10-CM | POA: Diagnosis not present

## 2016-12-29 DIAGNOSIS — Z6841 Body Mass Index (BMI) 40.0 and over, adult: Secondary | ICD-10-CM | POA: Diagnosis not present

## 2016-12-29 DIAGNOSIS — R739 Hyperglycemia, unspecified: Secondary | ICD-10-CM | POA: Diagnosis not present

## 2016-12-29 DIAGNOSIS — Z0000 Encounter for general adult medical examination without abnormal findings: Secondary | ICD-10-CM | POA: Diagnosis not present

## 2017-01-18 DIAGNOSIS — Z7901 Long term (current) use of anticoagulants: Secondary | ICD-10-CM | POA: Diagnosis not present

## 2017-01-18 DIAGNOSIS — Z5181 Encounter for therapeutic drug level monitoring: Secondary | ICD-10-CM | POA: Diagnosis not present

## 2017-02-15 DIAGNOSIS — Z7901 Long term (current) use of anticoagulants: Secondary | ICD-10-CM | POA: Diagnosis not present

## 2017-02-15 DIAGNOSIS — Z5181 Encounter for therapeutic drug level monitoring: Secondary | ICD-10-CM | POA: Diagnosis not present

## 2017-03-01 DIAGNOSIS — Z961 Presence of intraocular lens: Secondary | ICD-10-CM | POA: Diagnosis not present

## 2017-03-01 DIAGNOSIS — H26493 Other secondary cataract, bilateral: Secondary | ICD-10-CM | POA: Diagnosis not present

## 2017-03-20 DIAGNOSIS — Z7901 Long term (current) use of anticoagulants: Secondary | ICD-10-CM | POA: Diagnosis not present

## 2017-03-20 DIAGNOSIS — Z5181 Encounter for therapeutic drug level monitoring: Secondary | ICD-10-CM | POA: Diagnosis not present

## 2017-03-20 DIAGNOSIS — Z0000 Encounter for general adult medical examination without abnormal findings: Secondary | ICD-10-CM | POA: Insufficient documentation

## 2017-03-22 IMAGING — US US ART/VEN ABD/PELV/SCROTUM DOPPLER LTD
1 series · 14 of 25 positions shown · non-contrast
Comparison: None.

CLINICAL DATA: Bilateral testicular swelling since right inguinal
hernia repair 6 months ago.

EXAM:
SCROTAL ULTRASOUND
DOPPLER ULTRASOUND OF THE TESTICLES
TECHNIQUE: Complete ultrasound examination of the testicles, epididymis, and
other scrotal structures was performed. Color and spectral Doppler
ultrasound were also utilized to evaluate blood flow to the
testicles.

[Series 1: us art/ven abd/pelv/scrotum doppler ltd · 0.09mm/px · 14 of 87 slices shown]
[im 1/87]
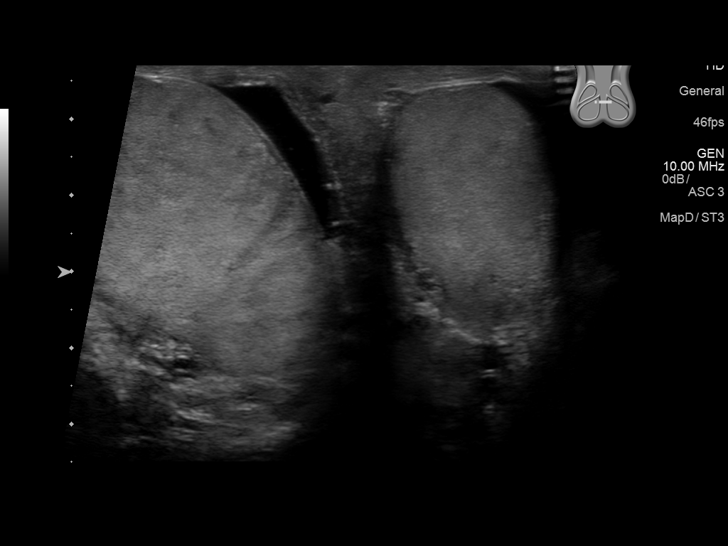
[im 8/87]
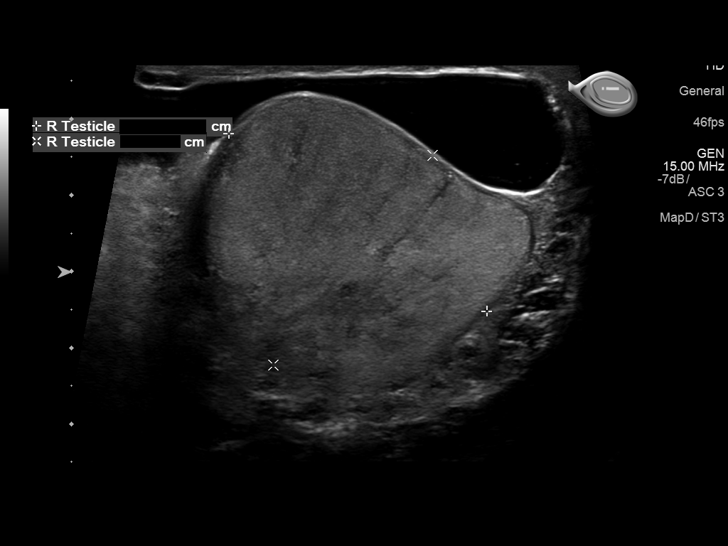
[im 15/87]
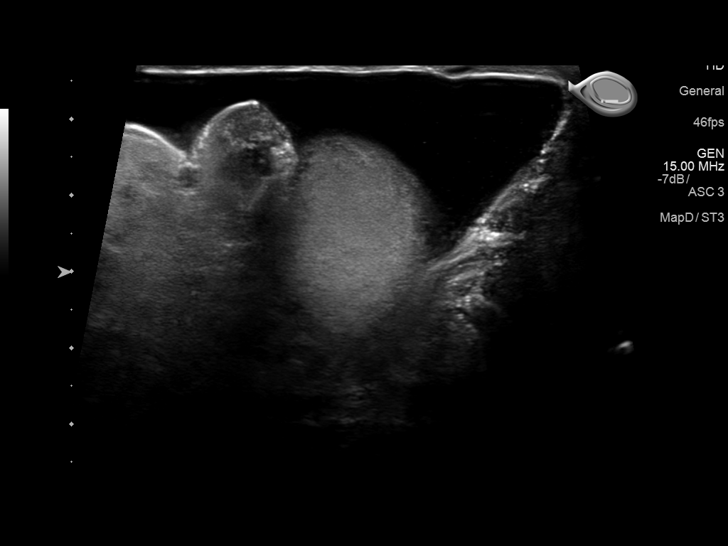
[im 22/87]
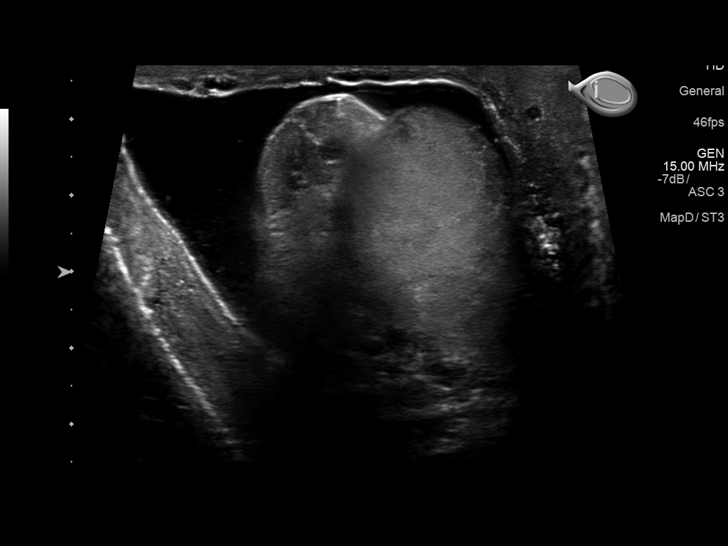
[im 29/87]
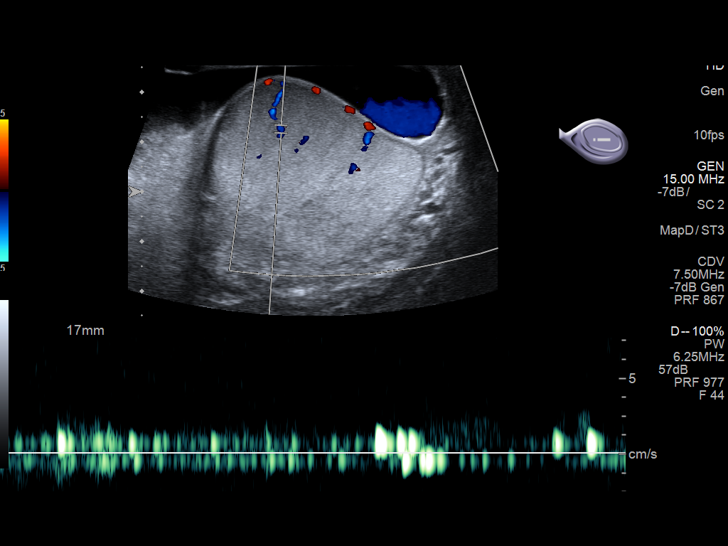
[im 33/87]
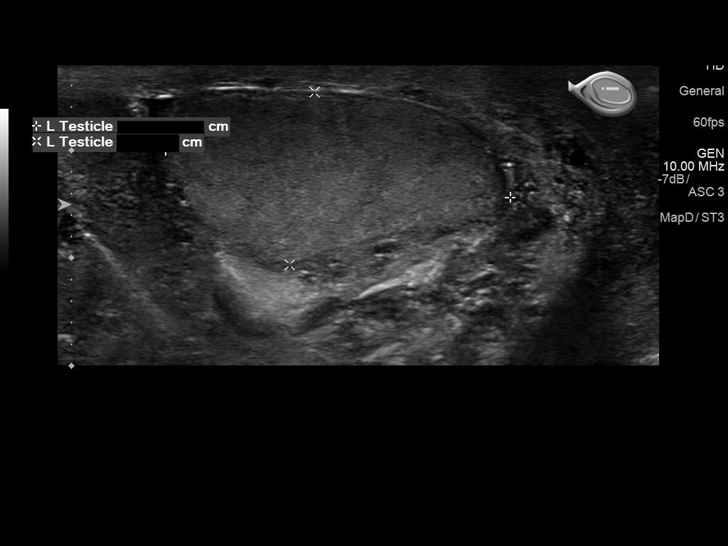
[im 40/87]
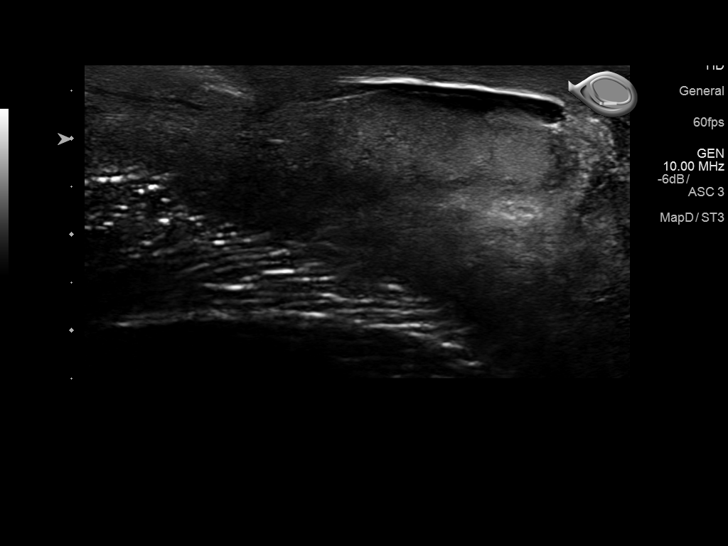
[im 47/87]
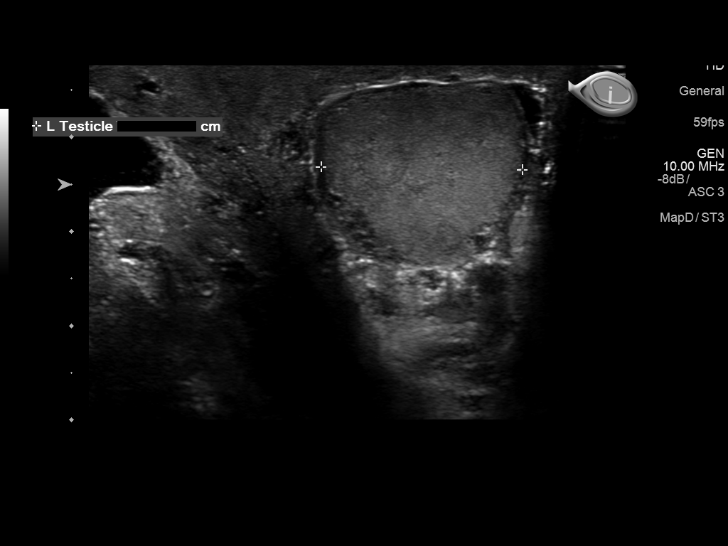
[im 54/87]
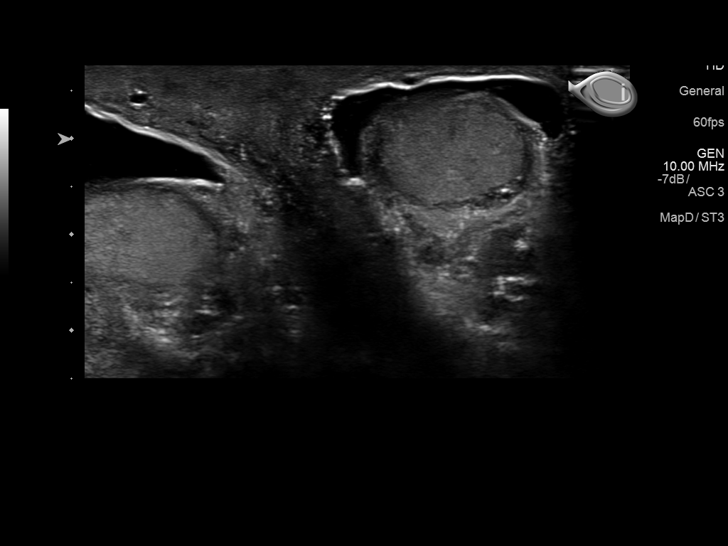
[im 58/87]
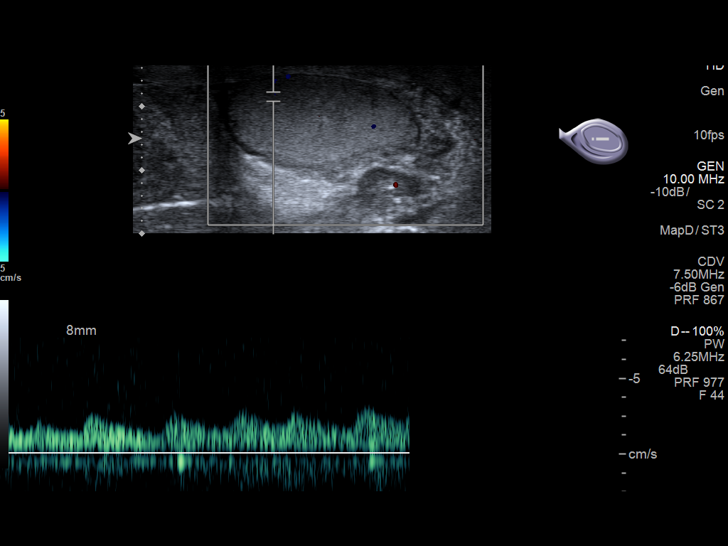
[im 65/87]
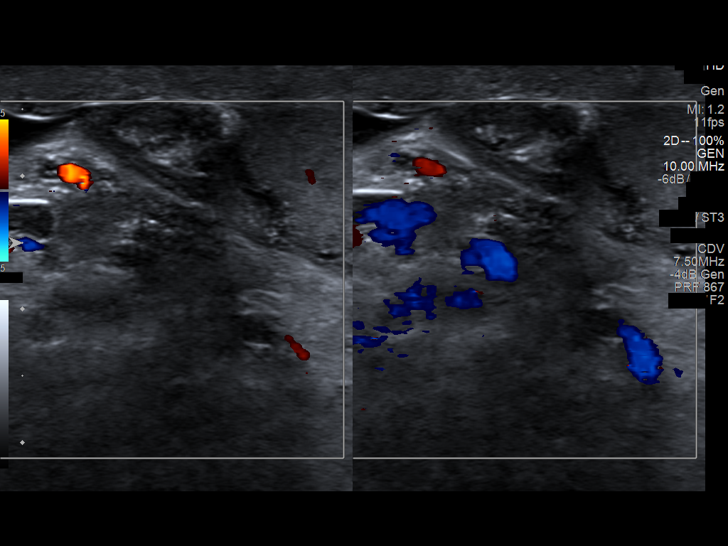
[im 72/87]
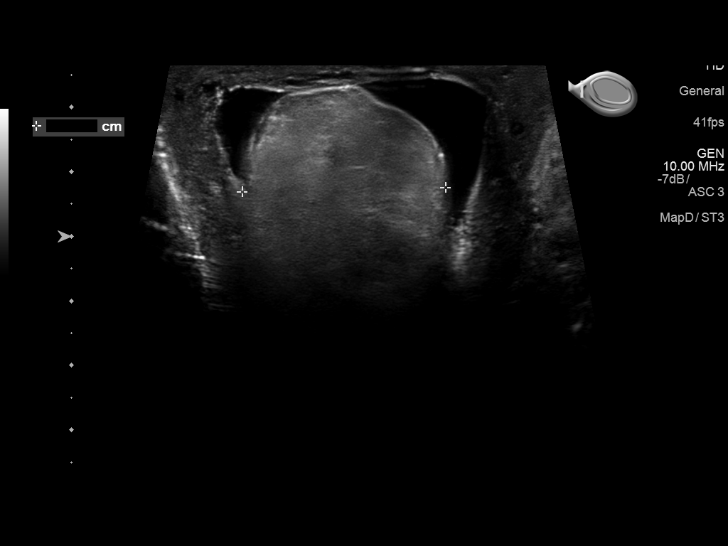
[im 79/87]
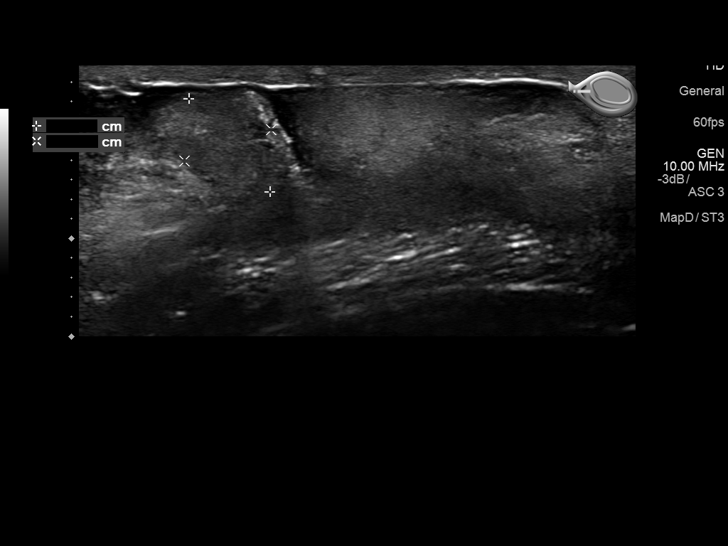
[im 87/87]
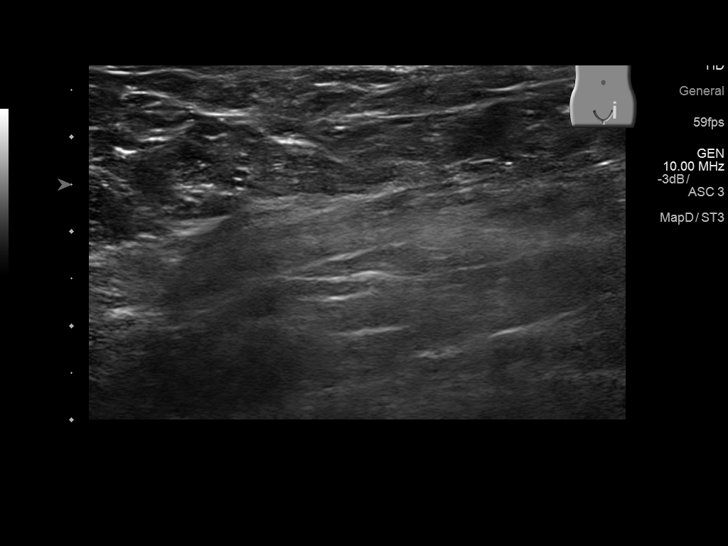

[14 of 25 positions shown; findings below may reference images not displayed]

FINDINGS: Right testicle

Measurements: 3.5 x 2.3 x 4.1 cm. No mass or microlithiasis
visualized.

Left testicle

Measurements: 1.6 x 2.1 x 3.2 cm. No mass or microlithiasis
visualized.

Right epididymis: Mildly enlarged without focal mass or
hypervascularity.

Left epididymis:  Normal in size and appearance.

Hydrocele: Moderate size simple right hydrocele and trace amount of
fluid within the left hemiscrotum.

Varicocele:  None visualized.

Pulsed Doppler interrogation of both testes demonstrates normal low
resistance arterial and venous waveforms bilaterally.
IMPRESSION: Normal testicles without evidence of torsion.

Moderate size simple right hydrocele and trace left hydrocele.

## 2017-04-17 DIAGNOSIS — Z5181 Encounter for therapeutic drug level monitoring: Secondary | ICD-10-CM | POA: Diagnosis not present

## 2017-04-17 DIAGNOSIS — Z7901 Long term (current) use of anticoagulants: Secondary | ICD-10-CM | POA: Diagnosis not present

## 2017-05-15 DIAGNOSIS — Z7901 Long term (current) use of anticoagulants: Secondary | ICD-10-CM | POA: Diagnosis not present

## 2017-05-15 DIAGNOSIS — Z5181 Encounter for therapeutic drug level monitoring: Secondary | ICD-10-CM | POA: Diagnosis not present

## 2017-05-30 DIAGNOSIS — I480 Paroxysmal atrial fibrillation: Secondary | ICD-10-CM | POA: Diagnosis not present

## 2017-05-30 DIAGNOSIS — Z86718 Personal history of other venous thrombosis and embolism: Secondary | ICD-10-CM | POA: Diagnosis not present

## 2017-06-12 DIAGNOSIS — Z5181 Encounter for therapeutic drug level monitoring: Secondary | ICD-10-CM | POA: Diagnosis not present

## 2017-06-12 DIAGNOSIS — Z7901 Long term (current) use of anticoagulants: Secondary | ICD-10-CM | POA: Diagnosis not present

## 2017-07-10 DIAGNOSIS — Z5181 Encounter for therapeutic drug level monitoring: Secondary | ICD-10-CM | POA: Diagnosis not present

## 2017-07-10 DIAGNOSIS — Z7901 Long term (current) use of anticoagulants: Secondary | ICD-10-CM | POA: Diagnosis not present

## 2019-06-11 DIAGNOSIS — U071 COVID-19: Secondary | ICD-10-CM

## 2019-06-11 HISTORY — DX: COVID-19: U07.1

## 2019-06-30 ENCOUNTER — Emergency Department: Payer: Medicare HMO

## 2019-06-30 ENCOUNTER — Encounter: Payer: Self-pay | Admitting: Emergency Medicine

## 2019-06-30 ENCOUNTER — Other Ambulatory Visit: Payer: Self-pay

## 2019-06-30 ENCOUNTER — Inpatient Hospital Stay
Admission: EM | Admit: 2019-06-30 | Discharge: 2019-07-04 | DRG: 177 | Disposition: A | Discharge status: Other Acute Inpatient Hospital | Payer: Medicare HMO | Attending: Hospitalist | Admitting: Hospitalist

## 2019-06-30 DIAGNOSIS — Z7901 Long term (current) use of anticoagulants: Secondary | ICD-10-CM | POA: Diagnosis not present

## 2019-06-30 DIAGNOSIS — Z87891 Personal history of nicotine dependence: Secondary | ICD-10-CM | POA: Diagnosis not present

## 2019-06-30 DIAGNOSIS — U071 COVID-19: Secondary | ICD-10-CM

## 2019-06-30 DIAGNOSIS — G92 Toxic encephalopathy: Secondary | ICD-10-CM | POA: Diagnosis present

## 2019-06-30 DIAGNOSIS — R000 Tachycardia, unspecified: Secondary | ICD-10-CM | POA: Diagnosis not present

## 2019-06-30 DIAGNOSIS — Z79899 Other long term (current) drug therapy: Secondary | ICD-10-CM | POA: Diagnosis not present

## 2019-06-30 DIAGNOSIS — E878 Other disorders of electrolyte and fluid balance, not elsewhere classified: Secondary | ICD-10-CM | POA: Diagnosis present

## 2019-06-30 DIAGNOSIS — E871 Hypo-osmolality and hyponatremia: Secondary | ICD-10-CM | POA: Diagnosis present

## 2019-06-30 DIAGNOSIS — J189 Pneumonia, unspecified organism: Secondary | ICD-10-CM | POA: Diagnosis present

## 2019-06-30 DIAGNOSIS — R4182 Altered mental status, unspecified: Secondary | ICD-10-CM | POA: Diagnosis not present

## 2019-06-30 DIAGNOSIS — J1289 Other viral pneumonia: Secondary | ICD-10-CM | POA: Diagnosis present

## 2019-06-30 DIAGNOSIS — Z86718 Personal history of other venous thrombosis and embolism: Secondary | ICD-10-CM | POA: Diagnosis not present

## 2019-06-30 DIAGNOSIS — W19XXXA Unspecified fall, initial encounter: Secondary | ICD-10-CM | POA: Diagnosis present

## 2019-06-30 DIAGNOSIS — J9601 Acute respiratory failure with hypoxia: Secondary | ICD-10-CM | POA: Diagnosis present

## 2019-06-30 DIAGNOSIS — R55 Syncope and collapse: Secondary | ICD-10-CM | POA: Diagnosis not present

## 2019-06-30 DIAGNOSIS — I4891 Unspecified atrial fibrillation: Secondary | ICD-10-CM

## 2019-06-30 DIAGNOSIS — N179 Acute kidney failure, unspecified: Secondary | ICD-10-CM | POA: Diagnosis present

## 2019-06-30 DIAGNOSIS — J159 Unspecified bacterial pneumonia: Secondary | ICD-10-CM | POA: Diagnosis present

## 2019-06-30 DIAGNOSIS — G9349 Other encephalopathy: Secondary | ICD-10-CM | POA: Diagnosis not present

## 2019-06-30 DIAGNOSIS — E860 Dehydration: Secondary | ICD-10-CM | POA: Diagnosis present

## 2019-06-30 DIAGNOSIS — D72819 Decreased white blood cell count, unspecified: Secondary | ICD-10-CM | POA: Diagnosis present

## 2019-06-30 DIAGNOSIS — R791 Abnormal coagulation profile: Secondary | ICD-10-CM | POA: Diagnosis not present

## 2019-06-30 DIAGNOSIS — A419 Sepsis, unspecified organism: Secondary | ICD-10-CM | POA: Diagnosis not present

## 2019-06-30 DIAGNOSIS — I824Y9 Acute embolism and thrombosis of unspecified deep veins of unspecified proximal lower extremity: Secondary | ICD-10-CM | POA: Diagnosis not present

## 2019-06-30 DIAGNOSIS — D6959 Other secondary thrombocytopenia: Secondary | ICD-10-CM | POA: Diagnosis present

## 2019-06-30 DIAGNOSIS — I482 Chronic atrial fibrillation, unspecified: Secondary | ICD-10-CM | POA: Diagnosis present

## 2019-06-30 HISTORY — DX: COVID-19: U07.1

## 2019-06-30 LAB — FIBRIN DERIVATIVES D-DIMER (ARMC ONLY): Fibrin derivatives D-dimer (ARMC): 668.6 ng/mL (FEU) — ABNORMAL HIGH (ref 0.00–499.00)

## 2019-06-30 LAB — CBC
HCT: 44.4 % (ref 39.0–52.0)
Hemoglobin: 14.7 g/dL (ref 13.0–17.0)
MCH: 28.8 pg (ref 26.0–34.0)
MCHC: 33.1 g/dL (ref 30.0–36.0)
MCV: 86.9 fL (ref 80.0–100.0)
Platelets: 50 10*3/uL — ABNORMAL LOW (ref 150–400)
RBC: 5.11 MIL/uL (ref 4.22–5.81)
RDW: 13.9 % (ref 11.5–15.5)
WBC: 5 10*3/uL (ref 4.0–10.5)
nRBC: 0 % (ref 0.0–0.2)

## 2019-06-30 LAB — MAGNESIUM: Magnesium: 2 mg/dL (ref 1.7–2.4)

## 2019-06-30 LAB — URINALYSIS, COMPLETE (UACMP) WITH MICROSCOPIC
Bacteria, UA: NONE SEEN
Bilirubin Urine: NEGATIVE
Glucose, UA: NEGATIVE mg/dL
Ketones, ur: 5 mg/dL — AB
Leukocytes,Ua: NEGATIVE
Nitrite: NEGATIVE
Protein, ur: 100 mg/dL — AB
Specific Gravity, Urine: 1.021 (ref 1.005–1.030)
pH: 5 (ref 5.0–8.0)

## 2019-06-30 LAB — BASIC METABOLIC PANEL
Anion gap: 10 (ref 5–15)
BUN: 22 mg/dL (ref 8–23)
CO2: 29 mmol/L (ref 22–32)
Calcium: 8.1 mg/dL — ABNORMAL LOW (ref 8.9–10.3)
Chloride: 95 mmol/L — ABNORMAL LOW (ref 98–111)
Creatinine, Ser: 1.4 mg/dL — ABNORMAL HIGH (ref 0.61–1.24)
GFR calc Af Amer: 59 mL/min — ABNORMAL LOW (ref >60)
GFR calc non Af Amer: 51 mL/min — ABNORMAL LOW (ref >60)
Glucose, Bld: 108 mg/dL — ABNORMAL HIGH (ref 70–99)
Potassium: 3.5 mmol/L (ref 3.5–5.1)
Sodium: 134 mmol/L — ABNORMAL LOW (ref 135–145)

## 2019-06-30 LAB — TROPONIN I (HIGH SENSITIVITY)
Troponin I (High Sensitivity): 13 ng/L (ref <18)
Troponin I (High Sensitivity): 16 ng/L (ref <18)

## 2019-06-30 LAB — FERRITIN: Ferritin: 202 ng/mL (ref 24–336)

## 2019-06-30 LAB — GLUCOSE, CAPILLARY: Glucose-Capillary: 95 mg/dL (ref 70–99)

## 2019-06-30 LAB — PROTIME-INR
INR: 1.4 — ABNORMAL HIGH (ref 0.8–1.2)
Prothrombin Time: 17.3 seconds — ABNORMAL HIGH (ref 11.4–15.2)

## 2019-06-30 LAB — LACTIC ACID, PLASMA
Lactic Acid, Venous: 1.6 mmol/L (ref 0.5–1.9)
Lactic Acid, Venous: 1.1 mmol/L (ref 0.5–1.9)

## 2019-06-30 LAB — POC SARS CORONAVIRUS 2 AG: SARS Coronavirus 2 Ag: NEGATIVE

## 2019-06-30 LAB — LACTATE DEHYDROGENASE: LDH: 237 U/L — ABNORMAL HIGH (ref 98–192)

## 2019-06-30 IMAGING — DX DG CHEST 1V PORT
1 series · 1 of 1 positions shown · non-contrast
Comparison: Abdominal CT [DATE]. No previous chest radiographs
available.

CLINICAL DATA: Near syncopal episode. On antibiotics for cough.

EXAM:
PORTABLE CHEST 1 VIEW

[chest ap]
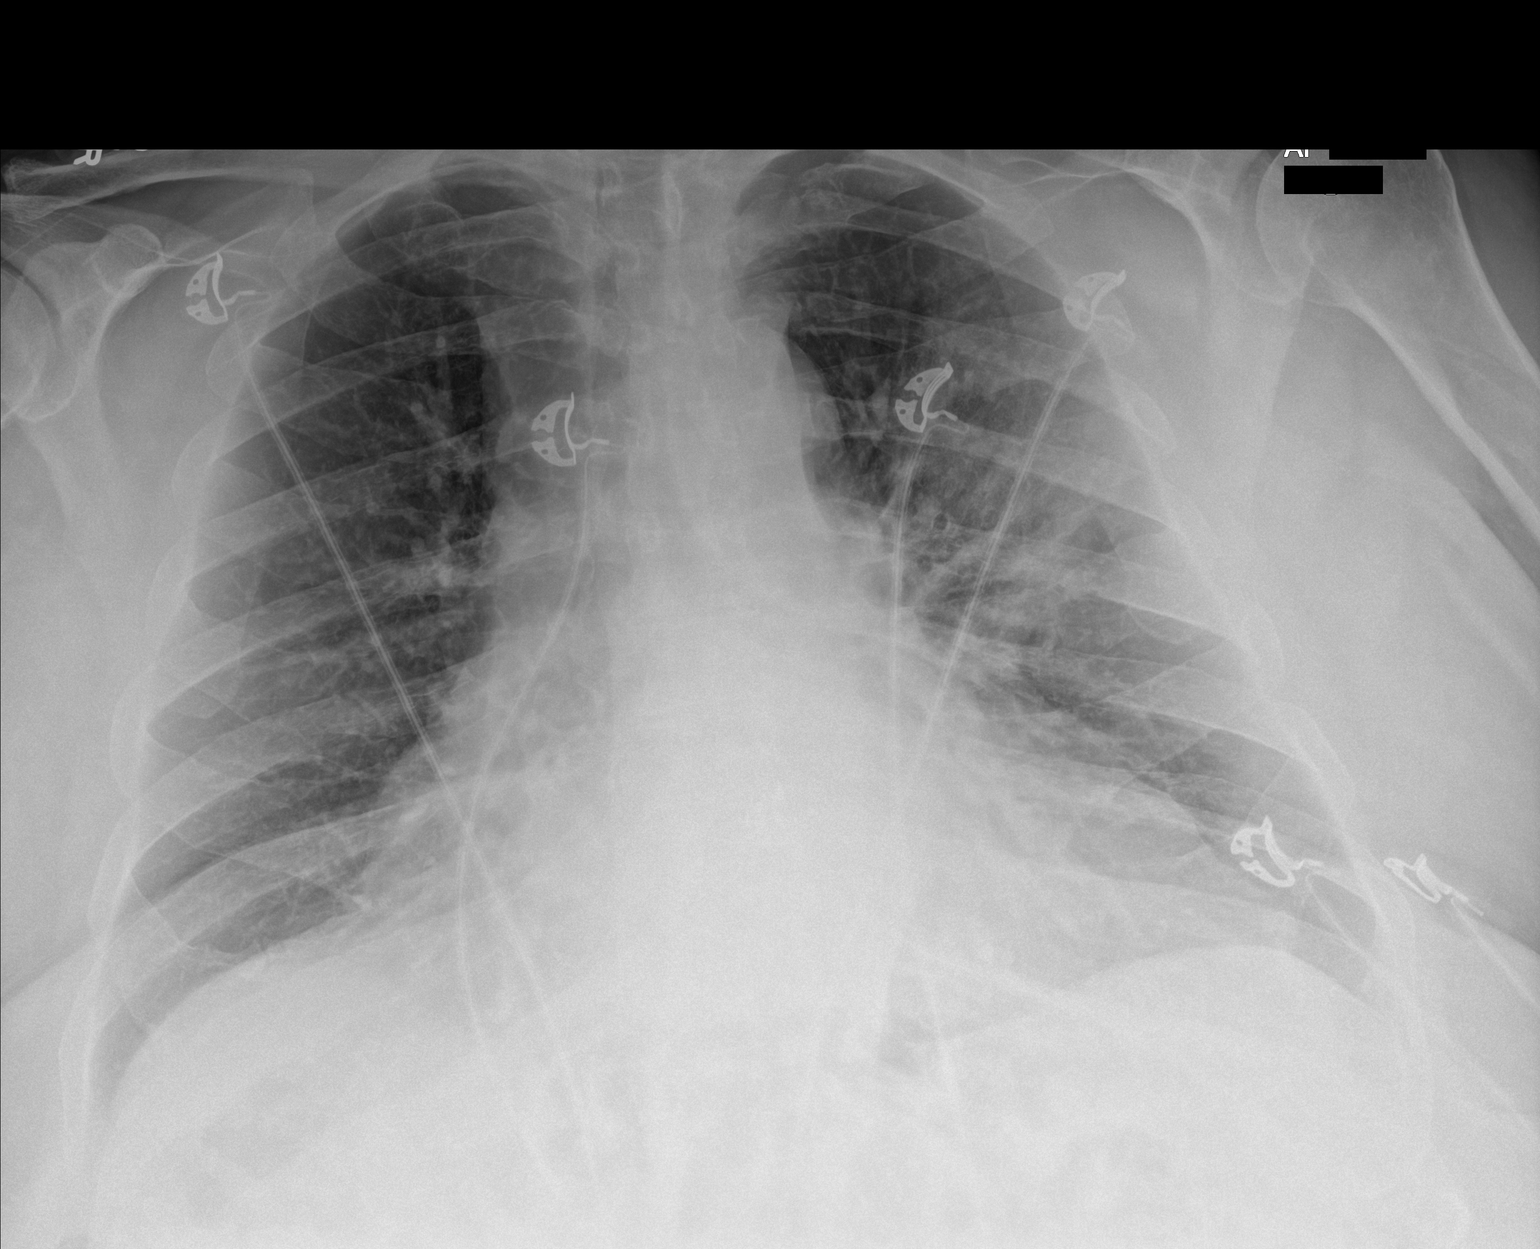

[1 of 1 positions shown; findings below may reference images not displayed]

FINDINGS: [EK] hours. Stable mild cardiomegaly exaggerated by prominent
epicardial fat as correlated with previous CT. There is patchy
airspace disease in the left perihilar region which could reflect
early pneumonia. The right lung appears clear. There is no edema,
pneumothorax or significant pleural effusion. The bones appear
unremarkable. Telemetry leads overlie the chest.
IMPRESSION: 1. Patchy airspace disease in the left perihilar region suspicious
for early pneumonia. Followup PA and lateral chest X-ray is
recommended in 3-4 weeks following completion of antibiotic therapy
to ensure resolution and exclude underlying malignancy.
2. Stable mild cardiomegaly exaggerated by prominent epicardial fat.

## 2019-06-30 IMAGING — CT CT HEAD W/O CM
4 series · 16 of 47 positions shown, 18 images · non-contrast
Comparison: None.

CLINICAL DATA: Near syncope.

EXAM:
CT HEAD WITHOUT CONTRAST
TECHNIQUE: Contiguous axial images were obtained from the base of the skull
through the vertex without intravenous contrast.

[Series 2: head bone · axial · 0.47mm/px · z∈[+342,+392]mm · 4 of 76 slices shown]
[im 8/76  bone]
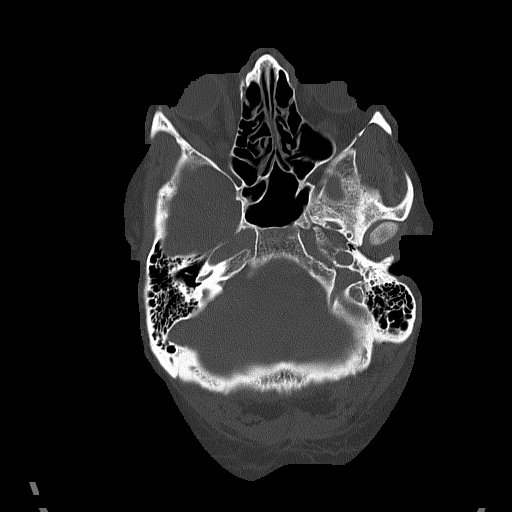
[im 15/76  bone]
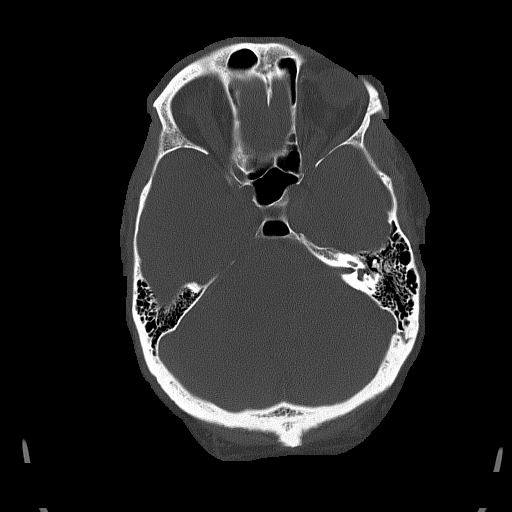
[im 26/76  bone]
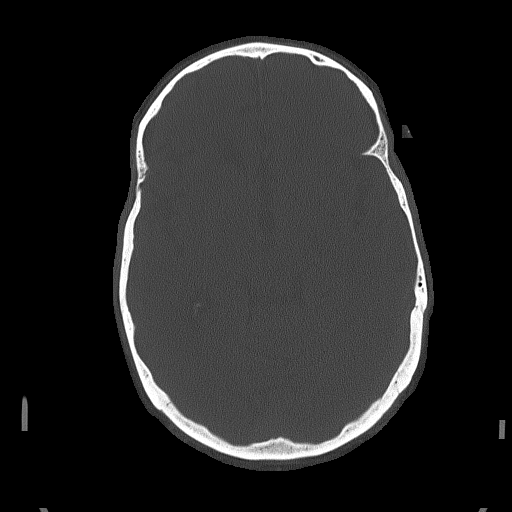
[im 33/76  bone]
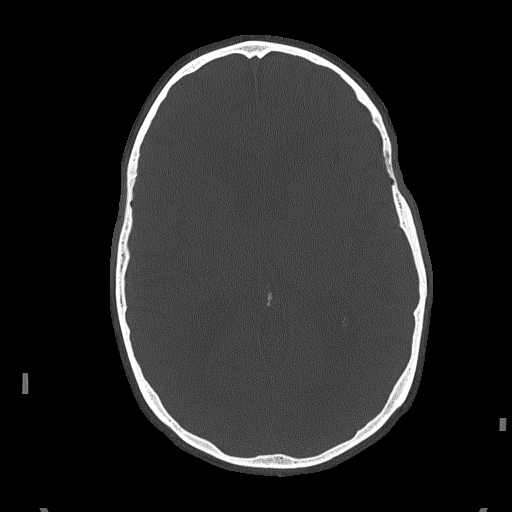

[Series 3: head wo · axial · 0.47mm/px · z∈[+348,+448]mm · 6 of 30 slices shown, 8 images]
[im 5/30  brain]
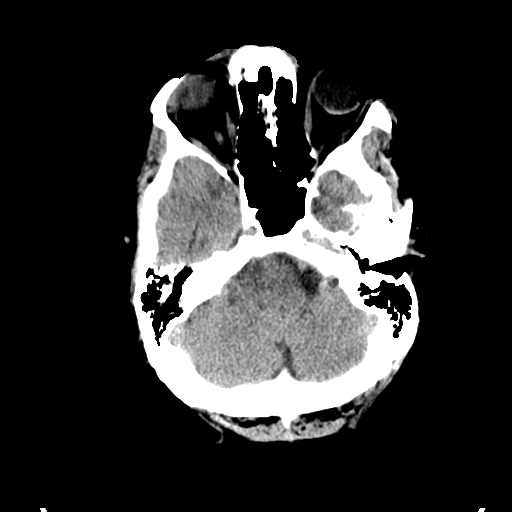
[im 5/30  bone]
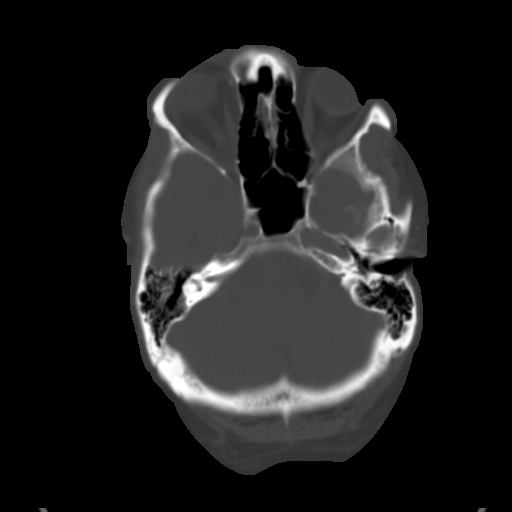
[im 9/30  brain]
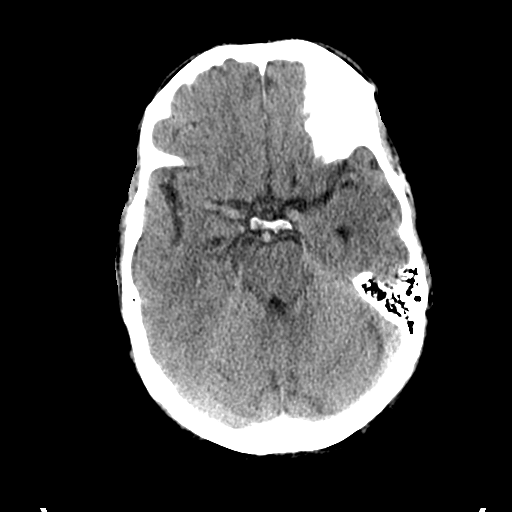
[im 13/30  brain]
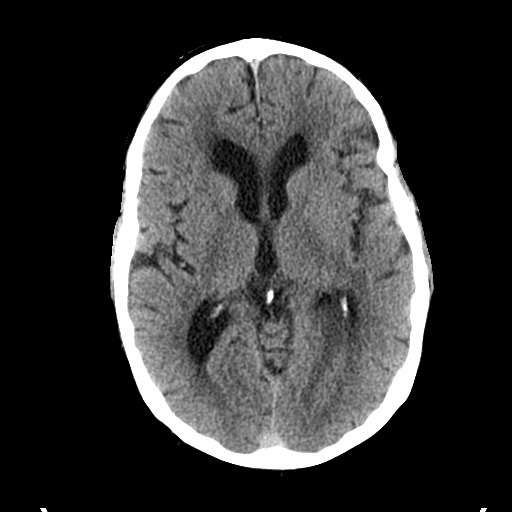
[im 17/30  brain]
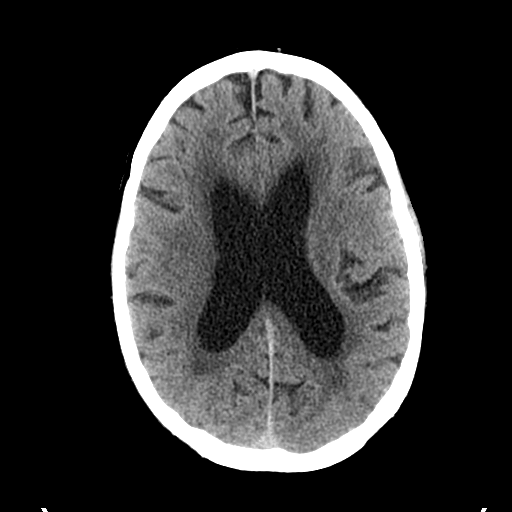
[im 21/30  brain]
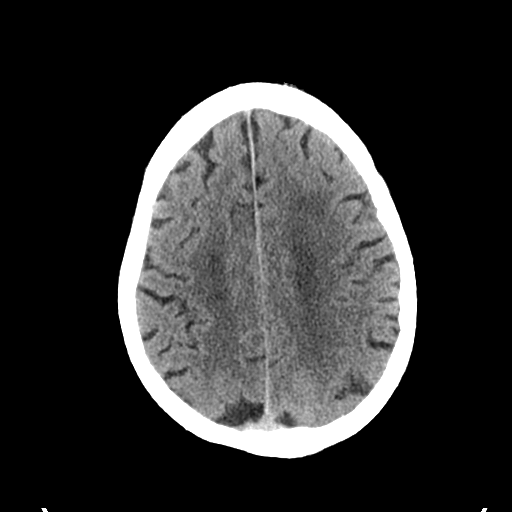
[im 21/30  bone]
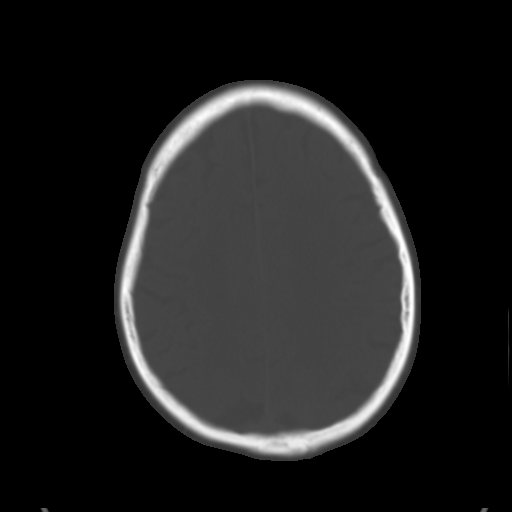
[im 25/30  brain]
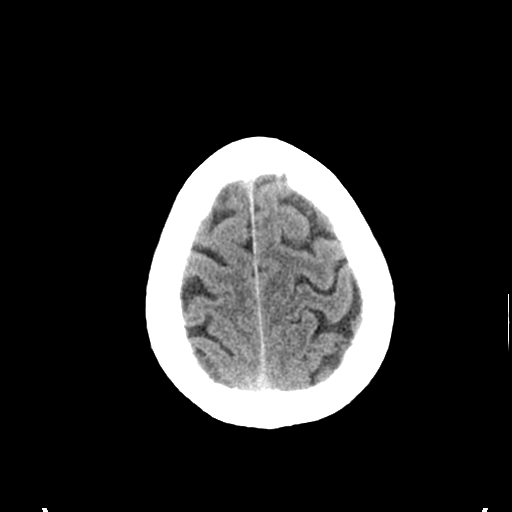

[Series 4: coronal soft tissue · coronal · 0.29mm/px · 3 of 70 slices shown]
[im 25/70  brain]
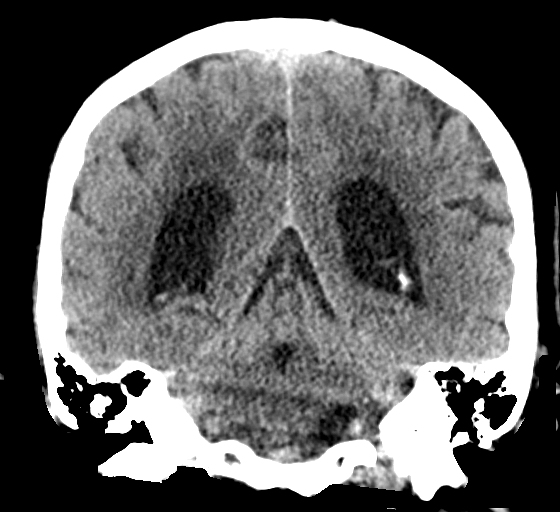
[im 32/70  brain]
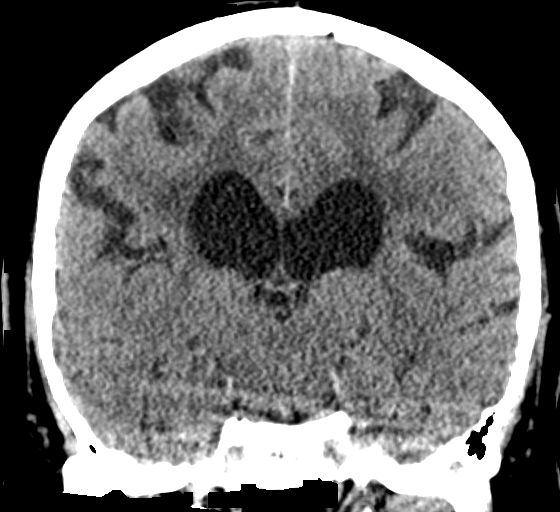
[im 39/70  brain]
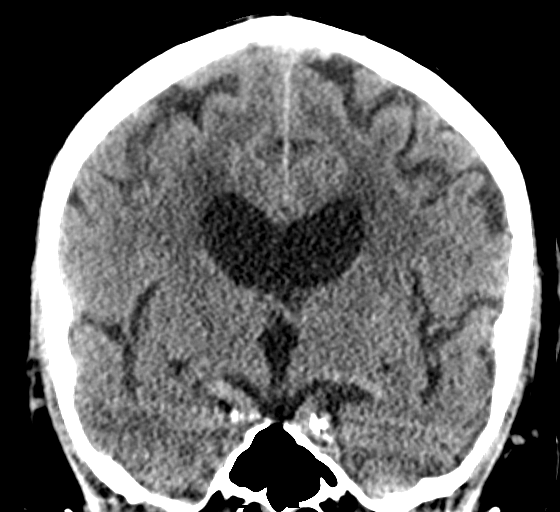

[Series 5: sagittal soft tissue · sagittal · 0.29mm/px · 3 of 55 slices shown]
[im 19/55  brain]
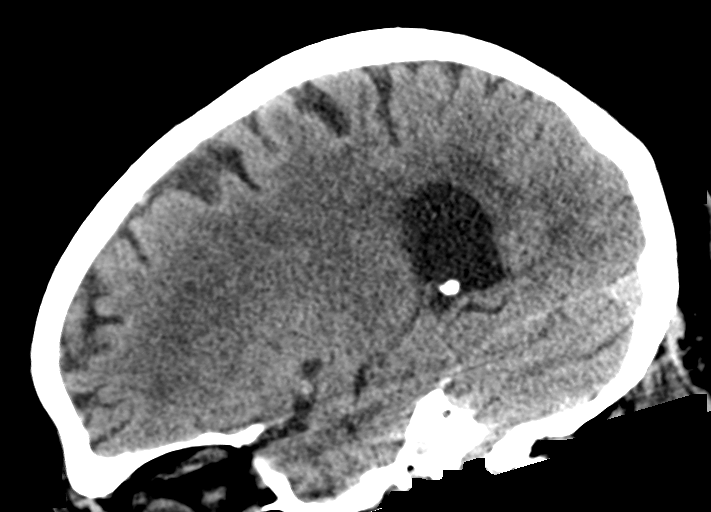
[im 28/55  brain]
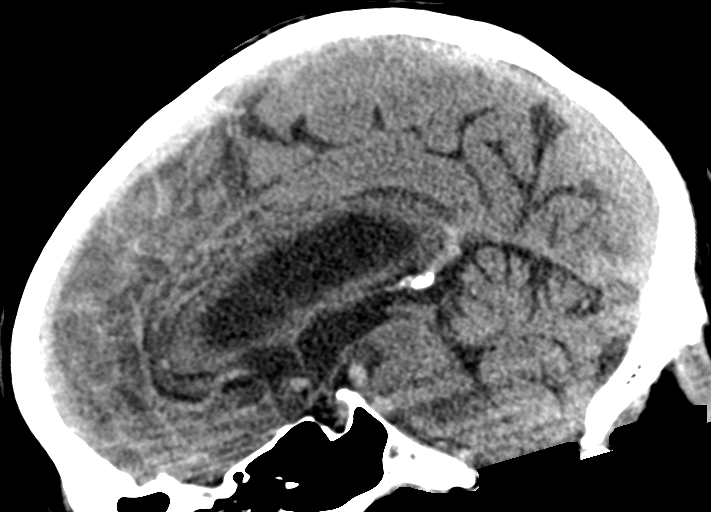
[im 37/55  brain]
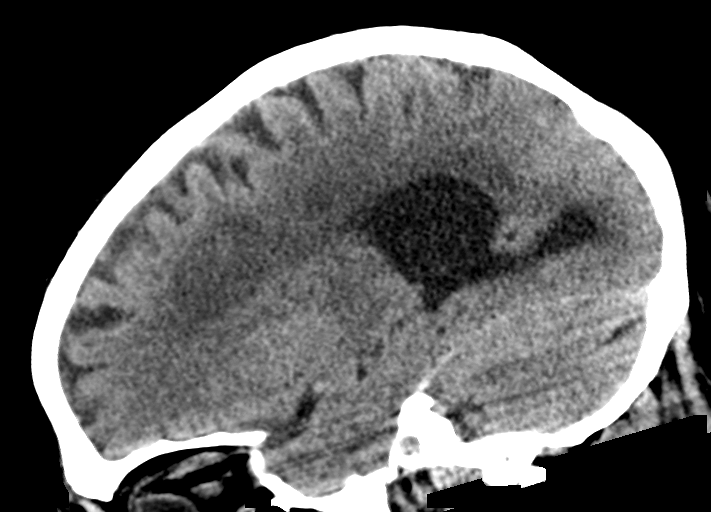

[16 of 47 positions shown; findings below may reference images not displayed]

FINDINGS: Brain: No subdural, epidural, or subarachnoid hemorrhage. Ventricles
and sulci are prominent but otherwise unremarkable. Cerebellum,
brainstem, and basal cisterns are normal. White matter changes are
noted. No acute cortical ischemia or infarct identified. No mass
effect or midline shift identified.

Vascular: No hyperdense vessel or unexpected calcification.

Skull: Normal. Negative for fracture or focal lesion.

Sinuses/Orbits: No acute finding.

Other: None.
IMPRESSION: Chronic white matter changes.  No acute intracranial abnormalities.

## 2019-06-30 MED ORDER — SODIUM CHLORIDE 0.9% FLUSH: 3.0000 mL | Freq: Once | INTRAVENOUS | Status: DC

## 2019-06-30 MED ORDER — ALBUTEROL SULFATE (2.5 MG/3ML) 0.083% IN NEBU: 2.5000 mg | INHALATION_SOLUTION | Freq: Four times a day (QID) | RESPIRATORY_TRACT | Status: DC

## 2019-06-30 MED ORDER — HYDROCOD POLST-CPM POLST ER 10-8 MG/5ML PO SUER: 5.0000 mL | Freq: Two times a day (BID) | ORAL | Status: DC | PRN

## 2019-06-30 MED ORDER — MAGNESIUM HYDROXIDE 400 MG/5ML PO SUSP: 30.0000 mL | Freq: Every day | ORAL | Status: DC | PRN

## 2019-06-30 MED ORDER — ONDANSETRON HCL 4 MG/2ML IJ SOLN: 4.0000 mg | Freq: Four times a day (QID) | INTRAMUSCULAR | Status: DC | PRN

## 2019-06-30 MED ORDER — GUAIFENESIN ER 600 MG PO TB12
600.0000 mg | ORAL_TABLET | Freq: Two times a day (BID) | ORAL | Status: DC
  Administered 2019-06-30 – 2019-07-04 (×8): 600 mg via ORAL
  Filled 2019-06-30 (×10): qty 1

## 2019-06-30 MED ORDER — SODIUM CHLORIDE 0.9 % IV SOLN: INTRAVENOUS | Status: DC

## 2019-06-30 MED ORDER — SODIUM CHLORIDE 0.9 % IV SOLN
1.0000 g | Freq: Once | INTRAVENOUS | Status: AC
  Administered 2019-06-30: 1 g via INTRAVENOUS
  Filled 2019-06-30: qty 10

## 2019-06-30 MED ORDER — METHYLPREDNISOLONE SODIUM SUCC 125 MG IJ SOLR
125.0000 mg | Freq: Once | INTRAMUSCULAR | Status: AC
  Administered 2019-06-30: 125 mg via INTRAVENOUS
  Filled 2019-06-30: qty 2

## 2019-06-30 MED ORDER — WARFARIN SODIUM 2.5 MG PO TABS
2.5000 mg | ORAL_TABLET | Freq: Once | ORAL | Status: AC
  Administered 2019-07-01: 2.5 mg via ORAL
  Filled 2019-06-30: qty 1

## 2019-06-30 MED ORDER — TRAZODONE HCL 50 MG PO TABS
25.0000 mg | ORAL_TABLET | Freq: Every evening | ORAL | Status: DC | PRN
  Filled 2019-06-30: qty 0.5

## 2019-06-30 MED ORDER — ONDANSETRON HCL 4 MG PO TABS
4.0000 mg | ORAL_TABLET | Freq: Four times a day (QID) | ORAL | Status: DC | PRN
  Filled 2019-06-30: qty 1

## 2019-06-30 MED ORDER — WARFARIN - PHARMACIST DOSING INPATIENT
Freq: Every day | Status: DC
  Filled 2019-06-30: qty 1

## 2019-06-30 MED ORDER — WARFARIN SODIUM 7.5 MG PO TABS: 7.5000 mg | ORAL_TABLET | Freq: Every day | ORAL | Status: DC

## 2019-06-30 MED ORDER — ACETAMINOPHEN 325 MG PO TABS
650.0000 mg | ORAL_TABLET | Freq: Once | ORAL | Status: AC
  Administered 2019-06-30: 650 mg via ORAL
  Filled 2019-06-30: qty 2

## 2019-06-30 MED ORDER — ACETAMINOPHEN 650 MG RE SUPP: 650.0000 mg | Freq: Four times a day (QID) | RECTAL | Status: DC | PRN

## 2019-06-30 MED ORDER — BENZONATATE 100 MG PO CAPS: 200.0000 mg | ORAL_CAPSULE | ORAL | Status: DC | PRN

## 2019-06-30 MED ORDER — POTASSIUM CHLORIDE 20 MEQ PO PACK
40.0000 meq | PACK | Freq: Once | ORAL | Status: AC
  Administered 2019-06-30: 40 meq via ORAL
  Filled 2019-06-30: qty 2

## 2019-06-30 MED ORDER — ACETAMINOPHEN 325 MG PO TABS
650.0000 mg | ORAL_TABLET | Freq: Four times a day (QID) | ORAL | Status: DC | PRN
  Administered 2019-07-01 – 2019-07-03 (×5): 650 mg via ORAL
  Filled 2019-06-30 (×4): qty 2

## 2019-06-30 NOTE — H&P (Signed)
[facility] NAME: Neil Bailey    MR#:  [id number]  DATE OF BIRTH:  September 03, 1950  DATE OF ADMISSION:  06/30/2019  PRIMARY CARE PHYSICIAN: [name], MD   REQUESTING/REFERRING PHYSICIAN: Sherrie George, FNP CHIEF COMPLAINT:   Chief Complaint  Patient presents with  . Near Syncope    HISTORY OF PRESENT ILLNESS:  Neil Bailey  is a 68 y.o. Caucasian male with a known history of atrial fibrillation and DVT on anticoagulation with Coumadin, presented to the emergency room with acute onset of presyncope while shopping with his wife.  He did not fall as he used to contact to support him.  He denies any chest pain or palpitations then.  He has been having altered mental status with mild confusion and lethargy per his wife today.  He was not able to answer questions and in the ER he was not able to briefly use his cell phone.  He admits to a cough over the last couple weeks which has been dry without significant dyspnea or wheezing.  He was initially afebrile in the ER and then developed a fever of 102.8.  He denied any nausea or vomiting or diarrhea.  No loss of taste or smell.  No recent COVID-19 exposure.  He denies any dysuria, oliguria or hematuria or flank pain.  No neck pain or stiffness.  He has been taking his Coumadin for atrial fibrillation and history of DVT.  No bleeding diathesis.  He was seen in a walk-in clinic on Friday and for suspected sinusitis he was given p.o. doxycycline that he should started today.  Upon presentation to the emergency room, temperature was 99.3 and later 102.8, blood pressure 106/92 and later 85/53, heart rate 108 and later 119, respiratory rate of 16 and later 40 and pulse oximetry 94 to 96% on room air.  Labs revealed thrombocytopenia with otherwise normal CBC.  Lactic acid 1.6 and troponin I of 13 and later 16.  BMP was remarkable for creatinine 1.4, potassium of 3.5 and mild hyponatremia and hypochloremia.   His COVID-19 rapid antigen test came back negative.  His portable chest x-ray showed patchy airspace disease in the left perihilar region suspicious for early pneumonia with recommendation for PA and lateral as well as follow-up in 3 to 4 weeks to ensure resolution.  It showed mild stable cardiomegaly.  Noncontrasted head CT scan revealed chronic white matter changes with no acute intracranial abnormalities.  EKG showed atrial fibrillation with rapid response of 110 with right axis deviation, Q waves anteroseptally into the inversion inferiorly.  The patient was given a gram of IV Rocephin and 650 mg p.o. Tylenol and 125 mg IV Solu-Medrol.  He will be admitted to a medical monitored bed for further evaluation and management.  PAST MEDICAL HISTORY:   Past Medical History:  Diagnosis Date  . Atrial fibrillation ([place])   . DVT (deep venous thrombosis) ([place])     PAST SURGICAL HISTORY:   Past Surgical History:  Procedure Laterality Date  . COLONOSCOPY  7-8 years ago   [facility]  . EYE SURGERY  05/2014, 06/2014   cataracts  . INGUINAL HERNIA REPAIR Right 01/19/2015   Procedure: RIGHT INCARCERATED INGUINAL HERNIA REPAIR;  Surgeon: Christene Lye, MD;  Location: [facility] ORS;  Service: General;  Laterality: Right;  . TONSILLECTOMY      SOCIAL HISTORY:   Social History   Tobacco Use  . Smoking status: Former Smoker    Years:  28.00    Quit date: 07/11/1986    Years since quitting: 32.9  . Smokeless tobacco: Never Used  Substance Use Topics  . Alcohol use: Yes    Alcohol/week: 0.0 standard drinks    Comment: occ    FAMILY HISTORY:  No family history on file.  DRUG ALLERGIES:  No Known Allergies  REVIEW OF SYSTEMS:   ROS As per history of present illness. All pertinent systems were reviewed above. Constitutional,  HEENT, cardiovascular, respiratory, GI, GU, musculoskeletal, neuro, psychiatric, endocrine,  integumentary and hematologic systems were reviewed and are otherwise    negative/unremarkable except for positive findings mentioned above in the HPI.   MEDICATIONS AT HOME:   Prior to Admission medications   Medication Sig Start Date End Date Taking? Authorizing Provider  benzonatate (TESSALON) 200 MG capsule Take 200 mg by mouth 3 (three) times daily as needed. 06/29/19 07/06/19 Yes [provider]  doxycycline (VIBRAMYCIN) 100 MG capsule Take 100 mg by mouth 2 (two) times daily. 06/29/19  Yes [provider]  hydrochlorothiazide (HYDRODIURIL) 25 MG tablet Take 25 mg by mouth daily. 06/25/19  Yes [provider]  warfarin (COUMADIN) 5 MG tablet Take 7.5 mg by mouth daily. 05/19/19  Yes [provider]      VITAL SIGNS:  Blood pressure (!) 85/53, pulse (!) 115, temperature (!) 102.8 F (39.3 C), temperature source Oral, resp. rate 15, height 5\' 9"  (1.753 m), weight 122.5 kg, SpO2 95 %.  PHYSICAL EXAMINATION:  Physical Exam  GENERAL:  68 y.o.-year-old ill-looking diaphoretic Caucasian male patient lying in the bed in mild respiratory stress with conversational dyspnea. EYES: Pupils equal, round, reactive to light and accommodation. No scleral icterus. Extraocular muscles intact.  HEENT: Head atraumatic, normocephalic. Oropharynx and nasopharynx clear.  NECK:  Supple, no jugular venous distention. No thyroid enlargement, no tenderness.  LUNGS: Minimally diminished left basal breath sounds.  He had wheezing.  The ER provider earlier that are currently not audible. CARDIOVASCULAR: Regular rate and rhythm, S1, S2 normal. No murmurs, rubs, or gallops.  ABDOMEN: Soft, nondistended, nontender. Bowel sounds present. No organomegaly or mass.  EXTREMITIES: No pedal edema, cyanosis, or clubbing.  NEUROLOGIC: Cranial nerves II through XII are intact. Muscle strength 5/5 in all extremities. Sensation intact. Gait not checked.  PSYCHIATRIC: The patient is currently alert and oriented x 3.  Normal affect and good eye contact. SKIN: No  obvious rash, lesion, or ulcer.   LABORATORY PANEL:   CBC Recent Labs  Lab 06/30/19 1754  WBC 5.0  HGB 14.7  HCT 44.4  PLT 50*   ------------------------------------------------------------------------------------------------------------------  Chemistries  Recent Labs  Lab 06/30/19 1754  NA 134*  K 3.5  CL 95*  CO2 29  GLUCOSE 108*  BUN 22  CREATININE 1.40*  CALCIUM 8.1*   ------------------------------------------------------------------------------------------------------------------  Cardiac Enzymes No results for input(s): TROPONINI in the last 168 hours. ------------------------------------------------------------------------------------------------------------------  RADIOLOGY:  CT Head Wo Contrast  Result Date: 06/30/2019 CLINICAL DATA:  Near syncope. EXAM: CT HEAD WITHOUT CONTRAST TECHNIQUE: Contiguous axial images were obtained from the base of the skull through the vertex without intravenous contrast. COMPARISON:  None. FINDINGS: Brain: No subdural, epidural, or subarachnoid hemorrhage. Ventricles and sulci are prominent but otherwise unremarkable. Cerebellum, brainstem, and basal cisterns are normal. White matter changes are noted. No acute cortical ischemia or infarct identified. No mass effect or midline shift identified. Vascular: No hyperdense vessel or unexpected calcification. Skull: Normal. Negative for fracture or focal lesion. Sinuses/Orbits: No acute finding. Other: None.  IMPRESSION: Chronic white matter changes.  No acute intracranial abnormalities. Electronically Signed   By: Dorise Bullion III M.D   On: 06/30/2019 17:38   DG Chest Portable 1 View  Result Date: 06/30/2019 CLINICAL DATA:  Near syncopal episode. On antibiotics for cough. EXAM: PORTABLE CHEST 1 VIEW COMPARISON:  Abdominal CT 12/26/2014. No previous chest radiographs available. FINDINGS: 1643 hours. Stable mild cardiomegaly exaggerated by prominent epicardial fat as correlated with  previous CT. There is patchy airspace disease in the left perihilar region which could reflect early pneumonia. The right lung appears clear. There is no edema, pneumothorax or significant pleural effusion. The bones appear unremarkable. Telemetry leads overlie the chest. IMPRESSION: 1. Patchy airspace disease in the left perihilar region suspicious for early pneumonia. Followup PA and lateral chest X-ray is recommended in 3-4 weeks following completion of antibiotic therapy to ensure resolution and exclude underlying malignancy. 2. Stable mild cardiomegaly exaggerated by prominent epicardial fat. Electronically Signed   By: Richardean Sale M.D.   On: 06/30/2019 17:13      IMPRESSION AND PLAN:   1.  Community-acquired pneumonia with subsequent sepsis without severe sepsis or septic shock and altered mental status.  Sepsis is manifested by tachypnea, tachycardia and hypotension.  The patient will be admitted to a medically monitored bed for community-acquired pneumonia and will be placed on IV Rocephin and Zithromax.  Mucolytic therapy be provided as well as albuterol MDI q.i.d.Marland Kitchen  Sputum Gram stain culture and sensitivity will be obtained.  His COVID-19 COVID-19 test came back negative.  We will follow COVID-19 PCR and obtain inflammatory markers and D-dimer.  2.  Atrial fibrillation with rapid ventricular response.  This is likely related to sepsis and fever.  We will utilize IV digoxin if needed especially with borderline blood pressure and hypotension.  We will continue Coumadin and follow INR.  3.  Presyncope.  This likely secondary to #1 and #2.  Will monitor for arrhythmias.  Will check orthostatics as orthostatic hypotension is certainly in the differential diagnosis.  He had no evidence for hypoglycemia.  Will obtain a 2D echo and carotid Doppler for further assessment.  4.  Acute kidney injury.  This is likely prerenal secondary to volume depletion.  His HCTZ will be held off and will place him  on IV hydration with normal saline.  Will follow BMP.  5.  DVT prophylaxis.  He will be continued on Coumadin and we will follow INR.   All the records are reviewed and case discussed with ED provider. The plan of care was discussed in details with the patient (and family). I answered all questions. The patient agreed to proceed with the above mentioned plan. Further management will depend upon hospital course.   CODE STATUS: Full code  TOTAL TIME TAKING CARE OF THIS PATIENT: 60 minutes.    Christel Mormon M.D on 06/30/2019 at 9:33 PM  [facility]   From 7 PM-7 AM, contact night-coverage [email or web address]  CC: Primary care physician; [name], MD   Note: This dictation was prepared with Dragon dictation along with smaller phrase technology. Any transcriptional errors that result from this process are unintentional.

## 2019-06-30 NOTE — ED Provider Notes (Signed)
[facility] Emergency Department Provider Note ____________________________________________   None    (approximate)  I have reviewed the triage vital signs and the nursing notes.   HISTORY  Chief Complaint Near Syncope  HPI Neil Bailey is a 68 y.o. male presents to the emergency department for treatment and evaluation after a near syncopal episode this afternoon while leaving a store.  Patient states that he suddenly felt that the room was spinning and tried to walk out to his car but got very dizzy.  He attempted to grab onto a shopping cart and both he and the shopping cart fell to the ground.  His wife witnessed the fall and states that he did not hit his head.  He did not completely lose consciousness.  He was recently treated at the walk-in clinic for a "sinus infection."  He is currently taking antibiotics, but is unsure if they are really helping.  He denies fever, cough, vomiting, diarrhea, shortness of breath, loss of taste or smell.  No known COVID-19 exposure.  Past Medical History:  Diagnosis Date  . Atrial fibrillation ([place])   . DVT (deep venous thrombosis) ([place])     There are no problems to display for this patient.   Past Surgical History:  Procedure Laterality Date  . COLONOSCOPY  7-8 years ago   [facility]  . EYE SURGERY  05/2014, 06/2014   cataracts  . INGUINAL HERNIA REPAIR Right 01/19/2015   Procedure: RIGHT INCARCERATED INGUINAL HERNIA REPAIR;  Surgeon: Christene Lye, MD;  Location: [facility] ORS;  Service: General;  Laterality: Right;  . TONSILLECTOMY      Prior to Admission medications   Medication Sig Start Date End Date Taking? Authorizing Provider  benzonatate (TESSALON) 200 MG capsule Take 200 mg by mouth 3 (three) times daily as needed. 06/29/19 07/06/19 Yes [provider]  doxycycline (VIBRAMYCIN) 100 MG capsule Take 100 mg by mouth 2 (two) times daily. 06/29/19  Yes [provider]    hydrochlorothiazide (HYDRODIURIL) 25 MG tablet Take 25 mg by mouth daily. 06/25/19  Yes [provider]  warfarin (COUMADIN) 5 MG tablet Take 7.5 mg by mouth daily. 05/19/19  Yes [provider]    Allergies Patient has no known allergies.  No family history on file.  Social History Social History   Tobacco Use  . Smoking status: Former Smoker    Years: 28.00    Quit date: 07/11/1986    Years since quitting: 32.9  . Smokeless tobacco: Never Used  Substance Use Topics  . Alcohol use: Yes    Alcohol/week: 0.0 standard drinks    Comment: occ  . Drug use: No    Review of Systems  Constitutional: No fever/chills Eyes: No visual changes. ENT: No sore throat. Cardiovascular: Denies chest pain. Respiratory: Denies shortness of breath. Gastrointestinal: No abdominal pain.  No nausea, no vomiting.  No diarrhea.  No constipation. Genitourinary: Negative for dysuria. Musculoskeletal: Negative for back pain. Skin: Negative for rash. Neurological: Negative for headaches, focal weakness or numbness. ____________________________________________   PHYSICAL EXAM:  VITAL SIGNS: ED Triage Vitals  Enc Vitals Group     BP 06/30/19 1559 (!) 106/92     Pulse Rate 06/30/19 1600 (!) 108     Resp 06/30/19 1557 16     Temp 06/30/19 1557 99.3 F (37.4 C)     Temp Source 06/30/19 1557 Oral     SpO2 06/30/19 1600 94 %     Weight 06/30/19 1558 270 lb (  122.5 kg)     Height 06/30/19 1558 5\' 9"  (1.753 m)     Head Circumference --      Peak Flow --      Pain Score 06/30/19 1558 0     Pain Loc --      Pain Edu? --      Excl. in [place]? --     Constitutional: Alert and oriented. Well appearing and in no acute distress. Eyes: Conjunctivae are normal. PERRL. Head: Atraumatic. Nose: No congestion/rhinnorhea. Mouth/Throat: Mucous membranes are moist.  Oropharynx non-erythematous. Neck: No stridor.   Hematological/Lymphatic/Immunilogical: No cervical  lymphadenopathy. Cardiovascular: Normal rate, regular rhythm. Grossly normal heart sounds.  Good peripheral circulation. Respiratory: Normal respiratory effort.  No retractions. Lungs CTAB. Gastrointestinal: Soft and nontender. No distention. No abdominal bruits. Genitourinary:  Musculoskeletal: No lower extremity tenderness nor pitting edema.  No joint effusions. Neurologic:  Normal speech and language. No gross focal neurologic deficits are appreciated. No gait instability. Skin:  Skin is warm, dry and intact. No rash noted. Psychiatric: Mood and affect are normal. Speech and behavior are normal.  ____________________________________________   LABS (all labs ordered are listed, but only abnormal results are displayed)  Labs Reviewed  BASIC METABOLIC PANEL - Abnormal; Notable for the following components:      Result Value   Sodium 134 (*)    Chloride 95 (*)    Glucose, Bld 108 (*)    Creatinine, Ser 1.40 (*)    Calcium 8.1 (*)    GFR calc non Af Amer 51 (*)    GFR calc Af Amer 59 (*)    All other components within normal limits  CBC - Abnormal; Notable for the following components:   Platelets 50 (*)    All other components within normal limits  LACTIC ACID, PLASMA  GLUCOSE, CAPILLARY  URINALYSIS, COMPLETE (UACMP) WITH MICROSCOPIC  LACTIC ACID, PLASMA  CBG MONITORING, ED  POC SARS CORONAVIRUS 2 AG -  ED  POC SARS CORONAVIRUS 2 AG  TROPONIN I (HIGH SENSITIVITY)  TROPONIN I (HIGH SENSITIVITY)   ____________________________________________  EKG  ED ECG REPORT I, Jere Vanburen, FNP-BC personally viewed and interpreted this ECG.   Date: 06/30/2019  EKG Time: 1605  Rate: 110  Rhythm: atrial fibrillation  Axis: right  Intervals:none  ST&T Change: no ST elevation  ____________________________________________  RADIOLOGY  ED MD interpretation:    CT head is negative for acute findings.  Patchy airspace disease in the left perihilar region.  Official radiology  report(s): CT Head Wo Contrast  Result Date: 06/30/2019 CLINICAL DATA:  Near syncope. EXAM: CT HEAD WITHOUT CONTRAST TECHNIQUE: Contiguous axial images were obtained from the base of the skull through the vertex without intravenous contrast. COMPARISON:  None. FINDINGS: Brain: No subdural, epidural, or subarachnoid hemorrhage. Ventricles and sulci are prominent but otherwise unremarkable. Cerebellum, brainstem, and basal cisterns are normal. White matter changes are noted. No acute cortical ischemia or infarct identified. No mass effect or midline shift identified. Vascular: No hyperdense vessel or unexpected calcification. Skull: Normal. Negative for fracture or focal lesion. Sinuses/Orbits: No acute finding. Other: None. IMPRESSION: Chronic white matter changes.  No acute intracranial abnormalities. Electronically Signed   By: Dorise Bullion III M.D   On: 06/30/2019 17:38   DG Chest Portable 1 View  Result Date: 06/30/2019 CLINICAL DATA:  Near syncopal episode. On antibiotics for cough. EXAM: PORTABLE CHEST 1 VIEW COMPARISON:  Abdominal CT 12/26/2014. No previous chest radiographs available. FINDINGS: 1643 hours. Stable mild cardiomegaly exaggerated  by prominent epicardial fat as correlated with previous CT. There is patchy airspace disease in the left perihilar region which could reflect early pneumonia. The right lung appears clear. There is no edema, pneumothorax or significant pleural effusion. The bones appear unremarkable. Telemetry leads overlie the chest. IMPRESSION: 1. Patchy airspace disease in the left perihilar region suspicious for early pneumonia. Followup PA and lateral chest X-ray is recommended in 3-4 weeks following completion of antibiotic therapy to ensure resolution and exclude underlying malignancy. 2. Stable mild cardiomegaly exaggerated by prominent epicardial fat. Electronically Signed   By: Richardean Sale M.D.   On: 06/30/2019 17:13     ____________________________________________   PROCEDURES  Procedure(s) performed (including Critical Care):  Procedures  ____________________________________________   INITIAL IMPRESSION / ASSESSMENT AND PLAN     68 year old male presenting to the emergency department after a near syncopal episode.  See HPI for further details.  Patient states that he does not feel dizzy at this time but just "does not feel right."  He is unable to further explain how he feels.  He denies chest pain or shortness of breath.  DIFFERENTIAL DIAGNOSIS  Vertigo, COVID-19, cardiac related event  ED COURSE  Pneumonia identified on chest x-ray. Due to COVID-19, will order PCR test to ensure this is not related. He states that he had a negative test yesterday at the walk in clinic. He is unsure which antibiotic he was prescribed.   ----------------------------------------- 7:30 PM on 06/30/2019 -----------------------------------------  Respiratory rate according to the pulse ox is in the 40's, however patient has a significant tremor. Counted respirations are about 26. Patients seems altered. He is currently unable to scoot himself up in bed and has pulled all his ECG leads off. Tremors seem worse than upon arrival. He has given me permission to call his wife and update her and ask any questions about his health.  Wife, Santiago Glad, was contacted. She states that for the last few days he has seemed "off." She has been unable to really identify what was different, but has noticed that he has been "lethargic" and wobbly/stumbling. She states that the tremors are normal, but have seemed worse. She denies confusion or incontinence. Nursing staff state that he was incontinent of urine.   ----------------------------------------- 8:41 PM on 06/30/2019 -----------------------------------------  Lactic acid is normal. Plan will be to admit him for altered mental status and pneumonia. Nursing staff again advise  that he has pulled off ECG leads and seems more confused. Hospitalist consulted for admission.   ----------------------------------------- 9:04 PM on 06/30/2019 -----------------------------------------  Dr. Sidney Ace consulted and will admit for further evaluation.  Casimiro Blum was evaluated in Emergency Department on 06/30/2019 for the symptoms described in the history of present illness. He was evaluated in the context of the global COVID-19 pandemic, which necessitated consideration that the patient might be at risk for infection with the SARS-CoV-2 virus that causes COVID-19. Institutional protocols and algorithms that pertain to the evaluation of patients at risk for COVID-19 are in a state of rapid change based on information released by regulatory bodies including the CDC and federal and state organizations. These policies and algorithms were followed during the patient's care in the ED.  ____________________________________________   FINAL CLINICAL IMPRESSION(S) / ED DIAGNOSES  Final diagnoses:  Altered mental status, unspecified altered mental status type  Near syncope  Community acquired pneumonia of left lung, unspecified part of lung     ED Discharge Orders    None  Note:  This document was prepared using Dragon voice recognition software and may include unintentional dictation errors.   Victorino Dike, FNP 06/30/19 2105    Nena Polio, MD 07/01/19 [phone]

## 2019-06-30 NOTE — ED Notes (Signed)
Pt to ED after near syncopal episode. Pt was out shopping with his wife and got very weak and felt like he was going to pass out. Pt states that he fell into the shopping cart. Pt denies any injury. Pt has hx/o a fib.

## 2019-06-30 NOTE — ED Triage Notes (Signed)
Pt to ED via [facility] from [employer] for a near syncopal episode. Pt was coming out of the store and got really weak. Pt denies LOC, states that he did not hit his head. Pt was seen at [facility] yesterday and given antibiotics and cough medication. Pt is in NAD at this time. A & O on EMS arrival to ED.

## 2019-06-30 NOTE — ED Notes (Signed)
Pt very confused. Pt asked for cell phone to call wife and has been holding phone and seems to not be able to figure out how to use it. Pt also asked where phone was when he was holding phone.   Lm edt

## 2019-07-01 ENCOUNTER — Encounter: Payer: Self-pay | Admitting: Family Medicine

## 2019-07-01 DIAGNOSIS — I482 Chronic atrial fibrillation, unspecified: Secondary | ICD-10-CM

## 2019-07-01 DIAGNOSIS — R000 Tachycardia, unspecified: Secondary | ICD-10-CM

## 2019-07-01 DIAGNOSIS — U071 COVID-19: Secondary | ICD-10-CM | POA: Diagnosis present

## 2019-07-01 LAB — BASIC METABOLIC PANEL
Anion gap: 13 (ref 5–15)
BUN: 19 mg/dL (ref 8–23)
CO2: 25 mmol/L (ref 22–32)
Calcium: 8 mg/dL — ABNORMAL LOW (ref 8.9–10.3)
Chloride: 100 mmol/L (ref 98–111)
Creatinine, Ser: 1.08 mg/dL (ref 0.61–1.24)
GFR calc Af Amer: >60 mL/min (ref >60)
GFR calc non Af Amer: >60 mL/min (ref >60)
Glucose, Bld: 154 mg/dL — ABNORMAL HIGH (ref 70–99)
Potassium: 3.6 mmol/L (ref 3.5–5.1)
Sodium: 138 mmol/L (ref 135–145)

## 2019-07-01 LAB — PROTIME-INR
INR: 1.5 — ABNORMAL HIGH (ref 0.8–1.2)
Prothrombin Time: 18.3 seconds — ABNORMAL HIGH (ref 11.4–15.2)

## 2019-07-01 LAB — SARS CORONAVIRUS 2 (TAT 6-24 HRS): SARS Coronavirus 2: POSITIVE — AB

## 2019-07-01 LAB — CBC
HCT: 42 % (ref 39.0–52.0)
Hemoglobin: 14.4 g/dL (ref 13.0–17.0)
MCH: 28.7 pg (ref 26.0–34.0)
MCHC: 34.3 g/dL (ref 30.0–36.0)
MCV: 83.7 fL (ref 80.0–100.0)
Platelets: 48 10*3/uL — ABNORMAL LOW (ref 150–400)
RBC: 5.02 MIL/uL (ref 4.22–5.81)
RDW: 13.9 % (ref 11.5–15.5)
WBC: 2.1 10*3/uL — ABNORMAL LOW (ref 4.0–10.5)
nRBC: 0 % (ref 0.0–0.2)

## 2019-07-01 LAB — GLUCOSE, CAPILLARY: Glucose-Capillary: 110 mg/dL — ABNORMAL HIGH (ref 70–99)

## 2019-07-01 LAB — C-REACTIVE PROTEIN: CRP: 6 mg/dL — ABNORMAL HIGH (ref <1.0)

## 2019-07-01 LAB — HIV ANTIBODY (ROUTINE TESTING W REFLEX): HIV Screen 4th Generation wRfx: NONREACTIVE

## 2019-07-01 LAB — PROCALCITONIN: Procalcitonin: 0.16 ng/mL

## 2019-07-01 MED ORDER — SODIUM CHLORIDE 0.9 % IV SOLN
500.0000 mg | INTRAVENOUS | Status: DC
  Administered 2019-07-01 – 2019-07-04 (×4): 500 mg via INTRAVENOUS
  Filled 2019-07-01 (×5): qty 500

## 2019-07-01 MED ORDER — SODIUM CHLORIDE 0.9 % IV SOLN
100.0000 mg | Freq: Every day | INTRAVENOUS | Status: DC
  Administered 2019-07-02 – 2019-07-04 (×3): 100 mg via INTRAVENOUS
  Filled 2019-07-01: qty 100
  Filled 2019-07-01: qty 20
  Filled 2019-07-01 (×2): qty 100

## 2019-07-01 MED ORDER — DEXAMETHASONE SODIUM PHOSPHATE 10 MG/ML IJ SOLN
6.0000 mg | Freq: Every day | INTRAMUSCULAR | Status: DC
  Administered 2019-07-01 – 2019-07-04 (×4): 6 mg via INTRAVENOUS
  Filled 2019-07-01 (×4): qty 0.6

## 2019-07-01 MED ORDER — WARFARIN SODIUM 7.5 MG PO TABS
7.5000 mg | ORAL_TABLET | Freq: Once | ORAL | Status: AC
  Administered 2019-07-01: 7.5 mg via ORAL
  Filled 2019-07-01 (×2): qty 1

## 2019-07-01 MED ORDER — ALBUTEROL SULFATE HFA 108 (90 BASE) MCG/ACT IN AERS
2.0000 | INHALATION_SPRAY | Freq: Four times a day (QID) | RESPIRATORY_TRACT | Status: DC | PRN
  Administered 2019-07-02 – 2019-07-04 (×3): 2 via RESPIRATORY_TRACT
  Filled 2019-07-01: qty 6.7

## 2019-07-01 MED ORDER — SODIUM CHLORIDE 0.9 % IV SOLN
2.0000 g | INTRAVENOUS | Status: DC
  Administered 2019-07-01: 20:00:00 2 g via INTRAVENOUS
  Filled 2019-07-01: qty 2
  Filled 2019-07-01: qty 20

## 2019-07-01 MED ORDER — SODIUM CHLORIDE 0.9 % IV SOLN
200.0000 mg | Freq: Once | INTRAVENOUS | Status: AC
  Administered 2019-07-01: 200 mg via INTRAVENOUS
  Filled 2019-07-01: qty 200

## 2019-07-01 MED ORDER — METOPROLOL TARTRATE 5 MG/5ML IV SOLN
5.0000 mg | Freq: Once | INTRAVENOUS | Status: AC
  Administered 2019-07-01: 18:00:00 5 mg via INTRAVENOUS
  Filled 2019-07-01: qty 5

## 2019-07-01 NOTE — Progress Notes (Signed)
Notified Hassan Rowan, NP, The patient's temp is at 103.2. tylenol was given at 1631 for a temp of 103.3 it came down some but is now back up. Hassan Rowan ordered a cooling blanket for the patient.

## 2019-07-01 NOTE — ED Notes (Signed)
Pt given a cup of coffee. 47mL of urine emptied from urinal. Pt has no further needs at this time.

## 2019-07-01 NOTE — Consult Note (Signed)
Remdesivir - Pharmacy Brief Note   O:  ALT: N/A CXR: Patchy airspace disease in the left perihilar region suspicious for early pneumonia. SpO2: 96% on 2L nasal cannula   A/P:  Remdesivir 200 mg IVPB once followed by 100 mg IVPB daily x 4 days.   Pearla Dubonnet, PharmD Clinical Pharmacist 07/01/2019 1:06 PM

## 2019-07-01 NOTE — Progress Notes (Signed)
ANTICOAGULATION CONSULT NOTE - Initial Consult  Pharmacy Consult for warfarin Indication: atrial fibrillation & h/o DVT  No Known Allergies  Patient Measurements: Height: 5\' 9"  (175.3 cm) Weight: 270 lb (122.5 kg) IBW/kg (Calculated) : 70.7  Vital Signs: Temp: 99.5 F (37.5 C) (12/20 2243) Temp Source: Oral (12/20 2243) BP: 104/69 (12/20 2245) Pulse Rate: 118 (12/20 2243)  Labs: Recent Labs    06/30/19 1754 06/30/19 2001 06/30/19 2141  HGB 14.7  --   --   HCT 44.4  --   --   PLT 50*  --   --   LABPROT  --   --  17.3*  INR  --   --  1.4*  CREATININE 1.40*  --   --   TROPONINIHS 13 16  --     Estimated Creatinine Clearance: 65.3 mL/min (A) (by C-G formula based on SCr of 1.4 mg/dL (H)).   Medical History: Past Medical History:  Diagnosis Date  . Atrial fibrillation ([place])   . DVT (deep venous thrombosis) ([facility])     Medications:  Scheduled:  . albuterol  2 puff Inhalation QID  . guaiFENesin  600 mg Oral BID  . sodium chloride flush  3 mL Intravenous Once  . warfarin  2.5 mg Oral Once  . Warfarin - Pharmacist Dosing Inpatient   Does not apply q1800    Assessment: Patient arrives for near syncope w/ h/o afib (currently in active afib on EKG w/ HR of 110), DVT on warfarin and presents w/ AMS to ED w/ qSOFA 2/3 and 3/4 SIRS s/t early pneumonia development on CXR. Patient also w/ plts of 50 on arrival (no h/o of baseline). Current INR tonight was 1.4 subtherapeutic. Patient was previously on eliquis, but stopped d/t financial issues and placed on warfarin per clinic note 12/9.  Patient takes warfarin 7.5 mg daily PTA.  Goal of Therapy:  INR 2-3 Monitor platelets by anticoagulation protocol: Yes   Plan:  Since patient's INR is subtherapeutic tonight will give a supplemental dose of warfarin 2.5 mg PO x 1 and will check daily INRs (patient states he took 7.5 mg dose tonight 12/20 prior to arriving). Will adjust dose per INR trends and monitor daily CBC's. No daily  dose ordered as of yet, will determine need for scheduled dosing once INR trend can be established. Also spoke to hospitalist about the need for bridging since patient is in active afib and has h/o DVT, and discussed holding bridge therapy for now, since patient's plts are 50 (possibly s/t sepsis) and bridging may add additional risk of bleeding and continued thrombocytopenia. Will continue to monitor.  Tobie Lords, PharmD, BCPS Clinical Pharmacist 07/01/2019,12:05 AM

## 2019-07-01 NOTE — Progress Notes (Addendum)
Neil CONSULT NOTE - Initial Consult  Pharmacy Consult for warfarin Indication: atrial fibrillation & h/o DVT  No Known Allergies  Patient Measurements: Height: 5\' 9"  (175.3 cm) Weight: 270 lb (122.5 kg) IBW/kg (Calculated) : 70.7  Vital Signs: Temp: 98.7 F (37.1 C) (12/21 0429) Temp Source: Oral (12/21 0429) BP: 117/75 (12/21 0429) Pulse Rate: 82 (12/21 0429)  Labs: Recent Labs    06/30/19 1754 06/30/19 2001 06/30/19 2141 07/01/19 0455  HGB 14.7  --   --  14.4  HCT 44.4  --   --  42.0  PLT 50*  --   --  48*  LABPROT  --   --  17.3* 18.3*  INR  --   --  1.4* 1.5*  CREATININE 1.40*  --   --  1.08  TROPONINIHS 13 16  --   --     Estimated Creatinine Clearance: 84.6 mL/min (by C-G formula based on SCr of 1.08 mg/dL).   Medical History: Past Medical History:  Diagnosis Date  . Atrial fibrillation ([place])   . DVT (deep venous thrombosis) ([facility])     Medications:  Scheduled:  . albuterol  2 puff Inhalation QID  . guaiFENesin  600 mg Oral BID  . sodium chloride flush  3 mL Intravenous Once  . Warfarin - Pharmacist Dosing Inpatient   Does not apply q1800    Assessment: Patient arrives for near syncope w/ h/o afib (currently in active afib on EKG w/ HR of 110), DVT on warfarin and presents w/ AMS to ED w/ qSOFA 2/3 and 3/4 SIRS s/t early pneumonia development on CXR. Patient also w/ plts of 50 on arrival (no h/o of baseline). Current INR tonight was 1.4 subtherapeutic. Patient was previously on Bailey, Neil Bailey stopped d/t financial issues and placed on warfarin per clinic note 12/9.  Patient takes warfarin 7.5 mg daily PTA.  INR is subtherapeutic evening 12/20 -gave a supplemental dose of warfarin 2.5 mg PO x 1 and will check daily INRs (patient states he took 7.5 mg dose 12/20 prior to arriving).  Also spoke to hospitalist about the need for bridging since patient is in active afib and has h/o DVT, and discussed holding bridge therapy for now, since patient's  plts are 50 (possibly s/t sepsis) and bridging may add additional risk of bleeding and continued thrombocytopenia  12/20 2141 INR 1.4 - (pta 7.5mg  + 2.5mg  inpatient) 12/21 0455 INR 1.5  Goal of Therapy:  INR 2-3 Monitor platelets by Neil protocol: Yes   Plan:  Will dose Warfarin 7.5mg  this evening  Will adjust dose per INR trends and monitor daily CBC's.  Will monitor Azithromycin/Warfarin DDI per possible unexpected jump in INR.  No daily dose ordered as of yet, will determine need for scheduled dosing once INR trend can be established.  Will continue to monitor.  Lu Duffel, PharmD, BCPS Clinical Pharmacist 07/01/2019 7:23 AM

## 2019-07-01 NOTE — Progress Notes (Signed)
Called to bedside due to tachypnea, tachycardia.  ECG obtained stat and showed atrial fibrillation, rates in 120s, patient confused/delirious.  Patient temp taken, had risen to 103F.    Acetaminophen ordered. Patient already on antibiotics for CAP, as well as COVID treatment.  IV fluid bolus and IV metoprolol ordered.  Restarted telemetry and continuous pulse ox.  Will continue to monitor.

## 2019-07-01 NOTE — Progress Notes (Addendum)
Patient's monitor showed an apneic episode and then respirations in the 30-45 range. I went into the room and notified Dr. Loleta Books about this and got patient's respirations, 41. Full set of vitals were asked for documented in patient's chart. Mentation was fine and then when Dr. Loleta Books came up patient couldn't tell us where he was and temp was 103.3, gave tylenol PO. Will continue to monitor.

## 2019-07-01 NOTE — Progress Notes (Addendum)
PROGRESS NOTE    Neil Bailey  [id number] DOB: 11/17/1950 DOA: 06/30/2019 PCP: [name], MD      Brief Narrative:  Mr. Neil Bailey is a 68 y.o. M with hx DVT and Afib on warfarin who presented with presyncope.  Patient was in usual state of health until last few days, new cough.  Then on day of admission, was "mildly confused and lethargic" per spouse, was out shopping and had episode of lightheadedness and near collapse in store.    In the ER, febrile to 102.32F, HR 110, RR 16-40.  Cr 1.4.  COVID negative.  CXR showed pneumonia left mid lung.  CT head unremarkable.  ECG showed AFib, no ischemia.       Assessment & Plan:  Coronavirus pneumonitis with acute hypoxic respiratory failure Patient presented with fever, tachycardia, hypoxia, and pneumonia on chest x-ray in the setting of the ongoing 2020 COVID-19 pandemic.  Covid testing positive.  Inflammatory markers mild elevation. -Start remdesivir, day 1 of 5 -Start dexamethasone, day 1  Cannot yet rule out bacterial pneumonia -Continue ceftriaxone and azithromycin for now -Check procal and de-escalate as able -Follow blood cultures   Dehydration Cr 1.4 on admission, with fluids now down to 1.1  Atrial fibrillation, chronic  Rate 90s and 100s. INR subtherapeutic -Continue warfarin  History DVT -Continue warfarin  Leukopenia Thrombocytopenia Acute on chronic thrombocytopenia (baseline 120s) From infection. Platelets <60K here, no clinical bleeding.          MDM and disposition: The below labs and imaging reports were reviewed and summarized above.  Medication management as above including anticoagulation decisions.  The patient was admitted with COVID-19 and acute hypoxic respiratory failure.         DVT prophylaxis: Warfarin Code Status: Full code Family Communication: Wife by phone    Consultants:     Procedures:   12/20 CT head --   Antimicrobials:   CTX/azithro 12/20 >>     Subjective: Feeling well.  No chest pain, palpitations, dizziness, dyspnea, orthopnea, leg swelling, sputum, hemoptysis.  Fever overnight, with some associated hypotension.  Now feeling much better.  Energy and appetite good.  Objective: Vitals:   06/30/19 2245 07/01/19 0246 07/01/19 0429 07/01/19 0732  BP: 104/69 100/80 117/75 125/81  Pulse:  90 82 95  Resp:  18 17 (!) 33  Temp:   98.7 F (37.1 C) 98.7 F (37.1 C)  TempSrc:   Oral Oral  SpO2:  96% 96% 95%  Weight:      Height:        Intake/Output Summary (Last 24 hours) at 07/01/2019 0913 Last data filed at 07/01/2019 0845 Gross per 24 hour  Intake --  Output 450 ml  Net -450 ml   Filed Weights   06/30/19 1558  Weight: 122.5 kg    Examination: General appearance: Elderly adult male, alert and in no acute distress.   HEENT: Anicteric, conjunctiva pink, lids and lashes normal. No nasal deformity, discharge, epistaxis.  Lips moist, dentition normal, oropharynx moist, no oral lesions, no gums bleeding.  Hearing normal.   Skin: Warm and dry.  No jaundice.  No suspicious rashes or lesions. Cardiac: Irregularly irregular, normal rate, nl S1-S2, no murmurs appreciated.  Capillary refill is brisk.  JVP not visible.  No LE edema.  Radial pulses 2+ and symmetric. Respiratory: Normal respiratory rate and rhythm.  CTAB without rales or wheezes. Abdomen: Abdomen soft.  No TTP or guarding. No ascites, distension, hepatosplenomegaly.   MSK: No deformities or  effusions. Neuro: Awake and alert.  EOMI, moves all extremities. Speech fluent.    Psych: Sensorium intact and responding to questions, attention normal. Affect normal.  Judgment and insight appear normal.    Data Reviewed: I have personally reviewed following labs and imaging studies:  CBC: Recent Labs  Lab 06/30/19 1754 07/01/19 0455  WBC 5.0 2.1*  HGB 14.7 14.4  HCT 44.4 42.0  MCV 86.9 83.7  PLT 50* 48*   Basic Metabolic Panel: Recent Labs  Lab 06/30/19 1754  06/30/19 2141 07/01/19 0455  NA 134*  --  138  K 3.5  --  3.6  CL 95*  --  100  CO2 29  --  25  GLUCOSE 108*  --  154*  BUN 22  --  19  CREATININE 1.40*  --  1.08  CALCIUM 8.1*  --  8.0*  MG  --  2.0  --    GFR: Estimated Creatinine Clearance: 84.6 mL/min (by C-G formula based on SCr of 1.08 mg/dL). Liver Function Tests: No results for input(s): AST, ALT, ALKPHOS, BILITOT, PROT, ALBUMIN in the last 168 hours. No results for input(s): LIPASE, AMYLASE in the last 168 hours. No results for input(s): AMMONIA in the last 168 hours. Coagulation Profile: Recent Labs  Lab 06/30/19 2141 07/01/19 0455  INR 1.4* 1.5*   Cardiac Enzymes: No results for input(s): CKTOTAL, CKMB, CKMBINDEX, TROPONINI in the last 168 hours. BNP (last 3 results) No results for input(s): PROBNP in the last 8760 hours. HbA1C: No results for input(s): HGBA1C in the last 72 hours. CBG: Recent Labs  Lab 06/30/19 1951  GLUCAP 95   Lipid Profile: No results for input(s): CHOL, HDL, LDLCALC, TRIG, CHOLHDL, LDLDIRECT in the last 72 hours. Thyroid Function Tests: No results for input(s): TSH, T4TOTAL, FREET4, T3FREE, THYROIDAB in the last 72 hours. Anemia Panel: Recent Labs    06/30/19 2141  FERRITIN 202   Urine analysis:    Component Value Date/Time   COLORURINE YELLOW (A) 06/30/2019 1612   APPEARANCEUR CLEAR (A) 06/30/2019 1612   LABSPEC 1.021 06/30/2019 1612   PHURINE 5.0 06/30/2019 1612   GLUCOSEU NEGATIVE 06/30/2019 1612   HGBUR SMALL (A) 06/30/2019 1612   BILIRUBINUR NEGATIVE 06/30/2019 1612   KETONESUR 5 (A) 06/30/2019 1612   PROTEINUR 100 (A) 06/30/2019 1612   NITRITE NEGATIVE 06/30/2019 1612   LEUKOCYTESUR NEGATIVE 06/30/2019 1612   Sepsis Labs: @LABRCNTIP (procalcitonin:4,lacticacidven:4)  ) Recent Results (from the past 240 hour(s))  CULTURE, BLOOD (ROUTINE X 2) w Reflex to ID Panel     Status: None (Preliminary result)   Collection Time: 06/30/19  9:41 PM   Specimen: BLOOD    Result Value Ref Range Status   Specimen Description BLOOD RIGHT ANTECUBITAL  Final   Special Requests   Final    BOTTLES DRAWN AEROBIC AND ANAEROBIC Blood Culture results may not be optimal due to an excessive volume of blood received in culture bottles   Culture   Final    NO GROWTH < 12 HOURS Performed at [facility], [address] [place], [place] [address]    Report Status PENDING  Incomplete  CULTURE, BLOOD (ROUTINE X 2) w Reflex to ID Panel     Status: None (Preliminary result)   Collection Time: 06/30/19  9:41 PM   Specimen: BLOOD  Result Value Ref Range Status   Specimen Description BLOOD LEFT ANTECUBITAL  Final   Special Requests   Final    BOTTLES DRAWN AEROBIC AND ANAEROBIC Blood Culture  adequate volume   Culture   Final    NO GROWTH < 12 HOURS Performed at [facility], [address], [place], [place] [address]    Report Status PENDING  Incomplete         Radiology Studies: CT Head Wo Contrast  Result Date: 06/30/2019 CLINICAL DATA:  Near syncope. EXAM: CT HEAD WITHOUT CONTRAST TECHNIQUE: Contiguous axial images were obtained from the base of the skull through the vertex without intravenous contrast. COMPARISON:  None. FINDINGS: Brain: No subdural, epidural, or subarachnoid hemorrhage. Ventricles and sulci are prominent but otherwise unremarkable. Cerebellum, brainstem, and basal cisterns are normal. White matter changes are noted. No acute cortical ischemia or infarct identified. No mass effect or midline shift identified. Vascular: No hyperdense vessel or unexpected calcification. Skull: Normal. Negative for fracture or focal lesion. Sinuses/Orbits: No acute finding. Other: None. IMPRESSION: Chronic white matter changes.  No acute intracranial abnormalities. Electronically Signed   By: Dorise Bullion III M.D   On: 06/30/2019 17:38   DG Chest Portable 1 View  Result Date: 06/30/2019 CLINICAL DATA:  Near syncopal episode. On antibiotics for  cough. EXAM: PORTABLE CHEST 1 VIEW COMPARISON:  Abdominal CT 12/26/2014. No previous chest radiographs available. FINDINGS: 1643 hours. Stable mild cardiomegaly exaggerated by prominent epicardial fat as correlated with previous CT. There is patchy airspace disease in the left perihilar region which could reflect early pneumonia. The right lung appears clear. There is no edema, pneumothorax or significant pleural effusion. The bones appear unremarkable. Telemetry leads overlie the chest. IMPRESSION: 1. Patchy airspace disease in the left perihilar region suspicious for early pneumonia. Followup PA and lateral chest X-ray is recommended in 3-4 weeks following completion of antibiotic therapy to ensure resolution and exclude underlying malignancy. 2. Stable mild cardiomegaly exaggerated by prominent epicardial fat. Electronically Signed   By: Richardean Sale M.D.   On: 06/30/2019 17:13        Scheduled Meds: . albuterol  2 puff Inhalation QID  . guaiFENesin  600 mg Oral BID  . sodium chloride flush  3 mL Intravenous Once  . warfarin  7.5 mg Oral ONCE-1800  . Warfarin - Pharmacist Dosing Inpatient   Does not apply q1800   Continuous Infusions: . sodium chloride 100 mL/hr at 07/01/19 0611  . azithromycin Stopped (07/01/19 0354)  . cefTRIAXone (ROCEPHIN)  IV       LOS: 1 day    Time spent: 35 minutes    Edwin Dada, MD [facility] 07/01/2019, 9:13 AM     Please page though [place] or Epic secure chat:  For [employer], Adult nurse

## 2019-07-01 NOTE — Significant Event (Signed)
Rapid Response Event Note  Overview:  Bedside RN stated that patient (who is COVID positive-) has a MEWS of 8 initially- temperature (103)/ increased HR and AMS.     Initial Focused Assessment: Patient alert and oriented- tylenol was given- temp down to [id number]- BP [id number] systolic- HR- low [id number].  Interventions: I started the ramdesiver and steriod- bedside RN gave metoprolol-     Plan of Care (if not transferred): After medications given - patient's HR came down to 90's- I told the bedside RN to give the next dose of tylenol as soon as it is due.  Since patient AMS improved and vitals more stable- I told the RN to notify me if there are any changes. Event Summary:   at      at          [facility]

## 2019-07-01 NOTE — Progress Notes (Signed)
Patient's MEWS was an 8 and his mentation was off. I called for a rapid response for this patient. RR nurse came and helped give patients medications and checked his vitals again. Patient's MEWS came down to a 6 now. All vital signs are documented in chart. Will continue to monitor patient.

## 2019-07-02 DIAGNOSIS — I824Y9 Acute embolism and thrombosis of unspecified deep veins of unspecified proximal lower extremity: Secondary | ICD-10-CM

## 2019-07-02 DIAGNOSIS — A419 Sepsis, unspecified organism: Secondary | ICD-10-CM

## 2019-07-02 LAB — URINALYSIS, COMPLETE (UACMP) WITH MICROSCOPIC
Bacteria, UA: NONE SEEN
Bilirubin Urine: NEGATIVE
Glucose, UA: NEGATIVE mg/dL
Hgb urine dipstick: NEGATIVE
Ketones, ur: 5 mg/dL — AB
Leukocytes,Ua: NEGATIVE
Nitrite: NEGATIVE
Protein, ur: 30 mg/dL — AB
Specific Gravity, Urine: 1.033 — ABNORMAL HIGH (ref 1.005–1.030)
Squamous Epithelial / HPF: NONE SEEN (ref 0–5)
pH: 5 (ref 5.0–8.0)

## 2019-07-02 LAB — CBC
HCT: 44.4 % (ref 39.0–52.0)
Hemoglobin: 14.5 g/dL (ref 13.0–17.0)
MCH: 28.5 pg (ref 26.0–34.0)
MCHC: 32.7 g/dL (ref 30.0–36.0)
MCV: 87.4 fL (ref 80.0–100.0)
Platelets: 48 10*3/uL — ABNORMAL LOW (ref 150–400)
RBC: 5.08 MIL/uL (ref 4.22–5.81)
RDW: 14 % (ref 11.5–15.5)
WBC: 4.2 10*3/uL (ref 4.0–10.5)
nRBC: 0 % (ref 0.0–0.2)

## 2019-07-02 LAB — BASIC METABOLIC PANEL
Anion gap: 10 (ref 5–15)
BUN: 20 mg/dL (ref 8–23)
CO2: 24 mmol/L (ref 22–32)
Calcium: 7.7 mg/dL — ABNORMAL LOW (ref 8.9–10.3)
Chloride: 100 mmol/L (ref 98–111)
Creatinine, Ser: 0.93 mg/dL (ref 0.61–1.24)
GFR calc Af Amer: >60 mL/min (ref >60)
GFR calc non Af Amer: >60 mL/min (ref >60)
Glucose, Bld: 123 mg/dL — ABNORMAL HIGH (ref 70–99)
Potassium: 3.4 mmol/L — ABNORMAL LOW (ref 3.5–5.1)
Sodium: 134 mmol/L — ABNORMAL LOW (ref 135–145)

## 2019-07-02 LAB — LACTIC ACID, PLASMA
Lactic Acid, Venous: 2 mmol/L (ref 0.5–1.9)
Lactic Acid, Venous: 1 mmol/L (ref 0.5–1.9)

## 2019-07-02 LAB — GLUCOSE, CAPILLARY: Glucose-Capillary: 100 mg/dL — ABNORMAL HIGH (ref 70–99)

## 2019-07-02 LAB — PROTIME-INR
INR: 1.9 — ABNORMAL HIGH (ref 0.8–1.2)
Prothrombin Time: 21.7 seconds — ABNORMAL HIGH (ref 11.4–15.2)

## 2019-07-02 LAB — PROCALCITONIN: Procalcitonin: 0.64 ng/mL

## 2019-07-02 LAB — ALT: ALT: 19 U/L (ref 0–44)

## 2019-07-02 MED ORDER — SODIUM CHLORIDE 0.9 % IV BOLUS
1000.0000 mL | Freq: Once | INTRAVENOUS | Status: AC
  Administered 2019-07-02: 1000 mL via INTRAVENOUS

## 2019-07-02 MED ORDER — WARFARIN SODIUM 7.5 MG PO TABS
7.5000 mg | ORAL_TABLET | Freq: Once | ORAL | Status: AC
  Administered 2019-07-02: 7.5 mg via ORAL
  Filled 2019-07-02: qty 1

## 2019-07-02 MED ORDER — IBUPROFEN 400 MG PO TABS
400.0000 mg | ORAL_TABLET | Freq: Four times a day (QID) | ORAL | Status: DC | PRN
  Administered 2019-07-02: 400 mg via ORAL
  Filled 2019-07-02: qty 1

## 2019-07-02 MED ORDER — SODIUM CHLORIDE 0.9 % IV SOLN
2.0000 g | Freq: Three times a day (TID) | INTRAVENOUS | Status: DC
  Administered 2019-07-02 – 2019-07-04 (×6): 2 g via INTRAVENOUS
  Filled 2019-07-02 (×11): qty 2

## 2019-07-02 NOTE — Progress Notes (Signed)
Reassessed patient. At this time patient was alert and oriented. No pain or shortness of breath reported. Patient's respiratory rate counted by this RN and [place] NT separately and was between 23 and 41 respectively but work of breathing not elevated. Patient able to do 1750 mL for three breaths with incentive spriometer for this RN. Fever broken, with temperature at 98.9 orally. HR 95-105 and irregular. Oxygen saturations 96% on 2 L nasal cannula. Touched base with Tia RN via phone and Amy RT in person.

## 2019-07-02 NOTE — Progress Notes (Addendum)
ANTICOAGULATION CONSULT NOTE  Pharmacy Consult for warfarin Indication: atrial fibrillation & h/o DVT  No Known Allergies  Patient Measurements: Height: 5\' 9"  (175.3 cm) Weight: 270 lb (122.5 kg) IBW/kg (Calculated) : 70.7  Vital Signs: Temp: 98.7 F (37.1 C) (12/22 0507) Temp Source: Oral (12/22 0507) BP: 116/76 (12/22 0510) Pulse Rate: 105 (12/22 0510)  Labs: Recent Labs    06/30/19 1754 06/30/19 2001 06/30/19 2141 07/01/19 0455  HGB 14.7  --   --  14.4  HCT 44.4  --   --  42.0  PLT 50*  --   --  48*  LABPROT  --   --  17.3* 18.3*  INR  --   --  1.4* 1.5*  CREATININE 1.40*  --   --  1.08  TROPONINIHS 13 16  --   --     Estimated Creatinine Clearance: 84.6 mL/min (by C-G formula based on SCr of 1.08 mg/dL).   Medical History: Past Medical History:  Diagnosis Date  . Atrial fibrillation ([facility])   . DVT (deep venous thrombosis) ([facility])     Medications:  Scheduled:  . dexamethasone (DECADRON) injection  6 mg Intravenous Daily  . guaiFENesin  600 mg Oral BID  . sodium chloride flush  3 mL Intravenous Once  . Warfarin - Pharmacist Dosing Inpatient   Does not apply q1800    Assessment: Patient arrived for near syncope w/ h/o afib, DVT on warfarin and presents w/ AMS to ED w/ qSOFA 2/3 and 3/4 SIRS s/t early pneumonia development on CXR. Patient also w/ plts of 50 on arrival (no h/o of baseline).  Patient was previously on eliquis, but stopped d/t financial issues and placed on warfarin per clinic note 12/9.  Patient takes warfarin 7.5 mg daily PTA.  The previous pharmacist spoke to the hospitalist about the need for bridging since patient is in active afib and has h/o DVT, and discussed holding bridge therapy for now, since patient's plts were 50 (possibly s/t sepsis) and bridging may add additional risk of bleeding and continued thrombocytopenia  DDIs: azithromycin, ceftriaxone  Date   INR   Dose 12/20   1.4   (pta 7.5mg  + 2.5mg  inpatient) 12/21   1.5   7.5  mg 12/22   1.9   7.5 mg    Goal of Therapy:  INR 2-3 Monitor platelets by anticoagulation protocol: Yes   Plan:   INR is trending up and just below therapeutic level: repeat home dose of warfarin 7.5mg    adjust dose per INR trends and monitor daily CBCs.   Dallie Piles, PharmD, BCPS Clinical Pharmacist 07/02/2019 7:29 AM

## 2019-07-02 NOTE — Progress Notes (Signed)
Reassessed patient again. Patient's temperature had spiked since last assessment but had broken to 99 F orally. Heart rate still fluctuating from 95-105 and irregular. Patient at this time alert and oriented and no complaints of pain or shortness of breath. Patient still tachypneic but like earlier no increased work of breathing. Patient again performed 3 breaths of IS up to 1750 mL with this RN. Noted in chart that pharmacy broadening antibiotics and new labs to be drawn. Touched base again with Tia RN.

## 2019-07-02 NOTE — Progress Notes (Signed)
PROGRESS NOTE    Neil Bailey  [id number] DOB: April 19, 1951 DOA: 06/30/2019 PCP: [name], MD      Brief Narrative:  Neil Bailey is a 68 y.o. M with hx DVT and Afib on warfarin who presented with presyncope.  Patient was in usual state of health until last few days, new cough.  Then on day of admission, was "mildly confused and lethargic" per spouse, was out shopping and had episode of lightheadedness and near collapse in store.    In the ER, febrile to 102.90F, HR 110, RR 16-40.  Cr 1.4.  COVID negative.  CXR showed pneumonia left mid lung.  CT head unremarkable.  ECG showed AFib, no ischemia.      Assessment & Plan:  Coronavirus pneumonitis with acute hypoxic respiratory failure Patient presented with fever, tachycardia, hypoxia, and pneumonia on chest x-ray in the setting of the ongoing 2020 COVID-19 pandemic.  Inflammatory markers mild elevation, COVID+ Stable on 1-2L O2 today but persistently febrile. -Continue remdesivir day 2 of 5 -Continue dexamethasone day 2    Possible sepsis On admission, no evidence of end organ damage or sepsis, but antibiotics were started for possible concomitant bacterial pneumonia.  Blood cultures so far negative.    Procalcitonin doubled in the last 24 hours, now up to 0.64, patient persistently febrile, and at time delirious/encephalopathic with tachycardia, tachypnea, fever and thrombocytopenia.   Suspect sepsis, unclear source, will broaden antibiotics and check urine.  Abdomen benign, doubt intracranial ifnection.    -Check urine culture and UA -Stop CTX -Continue azithromycin -Start ceftazidime -Follow blood cultures  Acute metabolic encephalopathy Patient denies alcohol use, doubt withdrawal.  Suspect this is delirium from fever/sepsis. Delirium precautions:   -Lights and TV off, minimize interruptions at night  -Blinds open and lights on during day  -Glasses/hearing aid with patient  -Frequent reorientation  -PT/OT when able  -Avoid sedation medications/Beers list medications   Dehydration Cr 1.4 on admission, normalized with fluids  Atrial fibrillation, chronic  HRs 120s yesterday.   INR still slgihtly subtherapeutic -Continue warfarin  History DVT -Continue warfarin  Leukopenia Thrombocytopenia Acute on chronic thrombocytopenia (baseline 120s) From infection. Platelets <60K here, no change today, needs close monitoring for bleeding due to warfarin  No clinical bleeding today         MDM and disposition: The below labs and imaging reports reviewed and summarized above.  Medication management as above. Including anticoagulation  The patient was admitted with COVID-19 and acute hypoxic respiratory failure.    Now with persistent fever, delirium.  Will escalate antibiotics, continue antivirals and steroids.       DVT prophylaxis: Warfarin Code Status: Full code Family Communication: Wife by phone    Consultants:     Procedures:   12/20 CT head -- no acute disease  Antimicrobials:   CTX 12/20 >> 12/22  Azithromycin 12/20 >>  Ceftazidime 12/22 >>   Subjective: Nurses report intermittent confusion/disorientation overnight, tachypnea without respiratory distress.  Tremor noted.  Patient reports he is feeling well, no dyspnea, chest pain, orthopnea, leg swelling, sputum, hemoptysis.  No epistaxis, hematuria, melena, hematochezia, gums bleeding.        Objective: Vitals:   07/02/19 1118 07/02/19 1146 07/02/19 1147 07/02/19 1242  BP: 118/72 124/73 122/79 111/65  Pulse: (!) 107 (!) 109 (!) 102 (!) 113  Resp:   (!) 34   Temp: 98.9 F (37.2 C) 99.7 F (37.6 C) 99.7 F (37.6 C) (!) 101.8 F (38.8 C)  TempSrc: Oral Oral  Oral Oral  SpO2: 95% 98% 96% 94%  Weight:      Height:        Intake/Output Summary (Last 24 hours) at 07/02/2019 1400 Last data filed at 07/02/2019 0800 Gross per 24 hour  Intake -  Output 575 ml  Net -575 ml   Filed Weights    06/30/19 1558  Weight: 122.5 kg    Examination:  General appearance: Elderly adult male, lying in bed, no acute distress, appears uncomfortable. HEENT: Anicteric, conjunctival pink, lids and lashes normal.  No nasal deformity, discharge, or epistaxis.  Lips moist, no gums bleeding, dentition normal, tongue moist, no oral lesions, hearing normal. Skin: Slightly diaphoretic, no jaundice, no suspicious rashes or lesions on the face, neck, chest, abdomen, belly, legs. Cardiac: Tachycardic, irregularly irregular, no murmurs appreciated, cap refill brisk, skin normal, JVP not visible, no lower extremity edema, distal pulses normal. Respiratory: Respirations shallow, tachypneic, no accessory muscle use, no grunting, no labored breathing, lung sounds clear without crackles, wheezes. Abdomen: Abdomen soft, no tenderness palpation in all 4 quadrants, no guarding, no ascites or distention. MSK: Normal muscle bulk and tone. Neuro: Awake and alert, oriented to person, place, and situation.  Extraocular movements intact, moves all extremities with normal strength and coordination, perhaps generalized weakness is mild, moderate tremor, speech fluent     Psych: Oriented to person, place, time, attention slightly diminished, affect blunted, judgment insight appear normal.    Data Reviewed: I have personally reviewed following labs and imaging studies:  CBC: Recent Labs  Lab 06/30/19 1754 07/01/19 0455 07/02/19 0645  WBC 5.0 2.1* 4.2  HGB 14.7 14.4 14.5  HCT 44.4 42.0 44.4  MCV 86.9 83.7 87.4  PLT 50* 48* 48*   Basic Metabolic Panel: Recent Labs  Lab 06/30/19 1754 06/30/19 2141 07/01/19 0455  NA 134*  --  138  K 3.5  --  3.6  CL 95*  --  100  CO2 29  --  25  GLUCOSE 108*  --  154*  BUN 22  --  19  CREATININE 1.40*  --  1.08  CALCIUM 8.1*  --  8.0*  MG  --  2.0  --    GFR: Estimated Creatinine Clearance: 84.6 mL/min (by C-G formula based on SCr of 1.08 mg/dL). Liver Function Tests:  Recent Labs  Lab 07/02/19 0645  ALT 19   No results for input(s): LIPASE, AMYLASE in the last 168 hours. No results for input(s): AMMONIA in the last 168 hours. Coagulation Profile: Recent Labs  Lab 06/30/19 2141 07/01/19 0455 07/02/19 0645  INR 1.4* 1.5* 1.9*   Cardiac Enzymes: No results for input(s): CKTOTAL, CKMB, CKMBINDEX, TROPONINI in the last 168 hours. BNP (last 3 results) No results for input(s): PROBNP in the last 8760 hours. HbA1C: No results for input(s): HGBA1C in the last 72 hours. CBG: Recent Labs  Lab 06/30/19 1951 07/01/19 1731 07/02/19 0857  GLUCAP 95 110* 100*   Lipid Profile: No results for input(s): CHOL, HDL, LDLCALC, TRIG, CHOLHDL, LDLDIRECT in the last 72 hours. Thyroid Function Tests: No results for input(s): TSH, T4TOTAL, FREET4, T3FREE, THYROIDAB in the last 72 hours. Anemia Panel: Recent Labs    06/30/19 2141  FERRITIN 202   Urine analysis:    Component Value Date/Time   COLORURINE YELLOW (A) 06/30/2019 1612   APPEARANCEUR CLEAR (A) 06/30/2019 1612   LABSPEC 1.021 06/30/2019 1612   PHURINE 5.0 06/30/2019 1612   GLUCOSEU NEGATIVE 06/30/2019 1612   HGBUR SMALL (A) 06/30/2019 1612  BILIRUBINUR NEGATIVE 06/30/2019 1612   KETONESUR 5 (A) 06/30/2019 1612   PROTEINUR 100 (A) 06/30/2019 1612   NITRITE NEGATIVE 06/30/2019 1612   LEUKOCYTESUR NEGATIVE 06/30/2019 1612   Sepsis Labs: @LABRCNTIP (procalcitonin:4,lacticacidven:4)  ) Recent Results (from the past 240 hour(s))  CULTURE, BLOOD (ROUTINE X 2) w Reflex to ID Panel     Status: None (Preliminary result)   Collection Time: 06/30/19  9:41 PM   Specimen: BLOOD  Result Value Ref Range Status   Specimen Description BLOOD RIGHT ANTECUBITAL  Final   Special Requests   Final    BOTTLES DRAWN AEROBIC AND ANAEROBIC Blood Culture results may not be optimal due to an excessive volume of blood received in culture bottles   Culture   Final    NO GROWTH 2 DAYS Performed at [facility], [address], [place], [place] [address]    Report Status PENDING  Incomplete  CULTURE, BLOOD (ROUTINE X 2) w Reflex to ID Panel     Status: None (Preliminary result)   Collection Time: 06/30/19  9:41 PM   Specimen: BLOOD  Result Value Ref Range Status   Specimen Description BLOOD LEFT ANTECUBITAL  Final   Special Requests   Final    BOTTLES DRAWN AEROBIC AND ANAEROBIC Blood Culture adequate volume   Culture   Final    NO GROWTH 2 DAYS Performed at [facility], [address] [place], [place] [address]    Report Status PENDING  Incomplete  SARS CORONAVIRUS 2 (TAT 6-24 HRS) Nasopharyngeal Nasopharyngeal Swab     Status: Abnormal   Collection Time: 06/30/19  9:55 PM   Specimen: Nasopharyngeal Swab  Result Value Ref Range Status   SARS Coronavirus 2 POSITIVE (A) NEGATIVE Final    Comment: RESULT CALLED TO, READ BACK BY AND VERIFIED WITH: Pryor Montes RN 10:00 07/01/19 ([email or web address]) (NOTE) SARS-CoV-2 target nucleic acids are DETECTED. The SARS-CoV-2 RNA is generally detectable in upper and lower respiratory specimens during the acute phase of infection. Positive results are indicative of the presence of SARS-CoV-2 RNA. Clinical correlation with patient history and other diagnostic information is  necessary to determine patient infection status. Positive results do not rule out bacterial infection or co-infection with other viruses.  The expected result is Negative. Fact Sheet for Patients: SugarRoll.be Fact Sheet for Healthcare Providers: https://www.woods-mathews.com/ This test is not yet approved or cleared by the [place] FDA and  has been authorized for detection and/or diagnosis of SARS-CoV-2 by FDA under an Emergency Use Authorization (EUA). This EUA will remain  in effect (meaning this test can be used) for  the duration of the COVID-19 declaration under Section 564(b)(1) of the Act, 21 U.S.C. section  360bbb-3(b)(1), unless the authorization is terminated or revoked sooner. Performed at [facility], [place] [address] [place],  [address]          Radiology Studies: CT Head Wo Contrast  Result Date: 06/30/2019 CLINICAL DATA:  Near syncope. EXAM: CT HEAD WITHOUT CONTRAST TECHNIQUE: Contiguous axial images were obtained from the base of the skull through the vertex without intravenous contrast. COMPARISON:  None. FINDINGS: Brain: No subdural, epidural, or subarachnoid hemorrhage. Ventricles and sulci are prominent but otherwise unremarkable. Cerebellum, brainstem, and basal cisterns are normal. White matter changes are noted. No acute cortical ischemia or infarct identified. No mass effect or midline shift identified. Vascular: No hyperdense vessel or unexpected calcification. Skull: Normal. Negative for fracture or focal lesion. Sinuses/Orbits: No acute finding. Other: None. IMPRESSION: Chronic  white matter changes.  No acute intracranial abnormalities. Electronically Signed   By: Dorise Bullion III M.D   On: 06/30/2019 17:38   DG Chest Portable 1 View  Result Date: 06/30/2019 CLINICAL DATA:  Near syncopal episode. On antibiotics for cough. EXAM: PORTABLE CHEST 1 VIEW COMPARISON:  Abdominal CT 12/26/2014. No previous chest radiographs available. FINDINGS: 1643 hours. Stable mild cardiomegaly exaggerated by prominent epicardial fat as correlated with previous CT. There is patchy airspace disease in the left perihilar region which could reflect early pneumonia. The right lung appears clear. There is no edema, pneumothorax or significant pleural effusion. The bones appear unremarkable. Telemetry leads overlie the chest. IMPRESSION: 1. Patchy airspace disease in the left perihilar region suspicious for early pneumonia. Followup PA and lateral chest X-ray is recommended in 3-4 weeks following completion of antibiotic therapy to ensure resolution and exclude underlying malignancy. 2.  Stable mild cardiomegaly exaggerated by prominent epicardial fat. Electronically Signed   By: Richardean Sale M.D.   On: 06/30/2019 17:13        Scheduled Meds: . dexamethasone (DECADRON) injection  6 mg Intravenous Daily  . guaiFENesin  600 mg Oral BID  . sodium chloride flush  3 mL Intravenous Once  . warfarin  7.5 mg Oral ONCE-1800  . Warfarin - Pharmacist Dosing Inpatient   Does not apply q1800   Continuous Infusions: . azithromycin 500 mg (07/02/19 0236)  . cefTRIAXone (ROCEPHIN)  IV 2 g (07/01/19 1954)  . remdesivir 100 mg in NS 100 mL 100 mg (07/02/19 0922)     LOS: 2 days    Time spent: 35 minutes    Edwin Dada, MD [facility] 07/02/2019, 2:00 PM     Please page though [place] or Epic secure chat:  For [employer], Adult nurse

## 2019-07-02 NOTE — Significant Event (Signed)
Rapid Response Event Note  Overview: Time Called: 0853 Arrival Time: 0854(had to go back to ICU for n95 so in room closer to 0900) Event Type: Respiratory, MEWS  Initial Focused Assessment: Rapid response RN arrived in patient's room and patient sitting up in bed with patient's nurse and NT at bedside. Patient COVID positive. Per patient's nurse patient had fever above 103F and that was treated with tylenol which brought fever down to 102sF. Upon this RN's assessment patient was alert and oriented but per patient's nurse patient's orientation has fluctuated throughout day (but has remained alert). Tremors present in bilateral upper extremities but team already aware of these. Patient's BP was 114/70 at 0842. HR between 105-115 on monitor, afib vs ST. Oxygen saturation 94% on 2L nasal cannula. Patient is tachypneic with RR 34 upon this RN's arrival. No elevated work of breathing or reported shortness of breath, patient's breaths just very small and shallow. Lungs sounds clear in upper lobes and very diminished at bases.   Interventions: Assisted primary RN in giving scheduled medications (remdesivir, IV decadron, and mucinex PO) as well as prn albuterol inhaler. Obtained incentive spirometer and instructed patient in use, patient able to do 3 breaths at 1750 mL with no distress. RT brought flutter valve for patient and instructed patient in use. Dr. Loleta Books arrived in person and personally assessed patient. MD did not want chest x-ray but would evaluate I/Os for need for lasix and stated would add additional medications to treat fever. Agreed with IS and flutter valve use for patient.  Plan of Care (if not transferred): MD, RT, primary RN, and rapid response RN reviewed patient's case at bedside with patient and then outside of room. Patient to remain in room 229 for now. MD reviewing medications. Rapid response RN will check back with patient and primary nurse around 11:00 to revaluate patient.  Event  Summary: Name of Physician Notified: Dr. Loleta Books at      at    Outcome: Stayed in room and stabalized  Event End Time: [place], [place]

## 2019-07-02 NOTE — Progress Notes (Signed)
Pt respiration  is 34. Pt denies SOB and difficulty breathing. Does not seem to be in distress. NP on call was notified and we will continue to monitor.

## 2019-07-02 NOTE — Consult Note (Signed)
Pharmacy Antibiotic Note  Neil Bailey is a 68 y.o. male admitted on 06/30/2019 with pneumonia.  Pharmacy has been consulted for ceftazidime dosing. He has received 2 days of azithromycin and ceftriaxone but is clinically worsening, therefore coverage is being broadened.  Plan: Start ceftazidime 2 grams IV every 8 hours  Height: 5\' 9"  (175.3 cm) Weight: 270 lb (122.5 kg) IBW/kg (Calculated) : 70.7  Temp (24hrs), Avg:101.2 F (38.4 C), Min:98.5 F (36.9 C), Max:103.3 F (39.6 C)  Recent Labs  Lab 06/30/19 1754 06/30/19 2001 06/30/19 2141 07/01/19 0455 07/02/19 0645  WBC 5.0  --   --  2.1* 4.2  CREATININE 1.40*  --   --  1.08  --   LATICACIDVEN  --  1.6 1.1  --   --     Estimated Creatinine Clearance: 84.6 mL/min (by C-G formula based on SCr of 1.08 mg/dL).    No Known Allergies  Antimicrobials this admission: ceftriaxone 12/20 >> 12/22 remdesivir 12/21 >> 12/26 azithromycin 12/20 >>  Ceftazidime 12/22 >>  Microbiology results: 12/20 BCx: NGTD 12/20 SARS Co-V 2: positive   Thank you for allowing pharmacy to be a part of this patient's care.  Dallie Piles 07/02/2019 2:23 PM

## 2019-07-03 DIAGNOSIS — G9349 Other encephalopathy: Secondary | ICD-10-CM

## 2019-07-03 DIAGNOSIS — J1289 Other viral pneumonia: Secondary | ICD-10-CM

## 2019-07-03 LAB — CBC
HCT: 44.5 % (ref 39.0–52.0)
Hemoglobin: 15 g/dL (ref 13.0–17.0)
MCH: 28.6 pg (ref 26.0–34.0)
MCHC: 33.7 g/dL (ref 30.0–36.0)
MCV: 84.9 fL (ref 80.0–100.0)
Platelets: 54 10*3/uL — ABNORMAL LOW (ref 150–400)
RBC: 5.24 MIL/uL (ref 4.22–5.81)
RDW: 14.2 % (ref 11.5–15.5)
WBC: 4.3 10*3/uL (ref 4.0–10.5)
nRBC: 0 % (ref 0.0–0.2)

## 2019-07-03 LAB — PROTIME-INR
INR: 2 — ABNORMAL HIGH (ref 0.8–1.2)
Prothrombin Time: 22.6 seconds — ABNORMAL HIGH (ref 11.4–15.2)

## 2019-07-03 LAB — BASIC METABOLIC PANEL
Anion gap: 13 (ref 5–15)
BUN: 21 mg/dL (ref 8–23)
CO2: 26 mmol/L (ref 22–32)
Calcium: 7.8 mg/dL — ABNORMAL LOW (ref 8.9–10.3)
Chloride: 98 mmol/L (ref 98–111)
Creatinine, Ser: 0.98 mg/dL (ref 0.61–1.24)
GFR calc Af Amer: >60 mL/min (ref >60)
GFR calc non Af Amer: >60 mL/min (ref >60)
Glucose, Bld: 118 mg/dL — ABNORMAL HIGH (ref 70–99)
Potassium: 3.1 mmol/L — ABNORMAL LOW (ref 3.5–5.1)
Sodium: 137 mmol/L (ref 135–145)

## 2019-07-03 LAB — MAGNESIUM: Magnesium: 2.1 mg/dL (ref 1.7–2.4)

## 2019-07-03 LAB — PROCALCITONIN: Procalcitonin: 0.52 ng/mL

## 2019-07-03 MED ORDER — FUROSEMIDE 10 MG/ML IJ SOLN
40.0000 mg | Freq: Once | INTRAMUSCULAR | Status: AC
  Administered 2019-07-03: 06:00:00 40 mg via INTRAVENOUS
  Filled 2019-07-03: qty 4

## 2019-07-03 MED ORDER — SODIUM CHLORIDE 0.9 % IV SOLN
INTRAVENOUS | Status: DC | PRN
  Administered 2019-07-03: 250 mL via INTRAVENOUS

## 2019-07-03 MED ORDER — POTASSIUM CHLORIDE CRYS ER 20 MEQ PO TBCR
40.0000 meq | EXTENDED_RELEASE_TABLET | Freq: Once | ORAL | Status: AC
  Administered 2019-07-03: 40 meq via ORAL
  Filled 2019-07-03: qty 2

## 2019-07-03 MED ORDER — SODIUM CHLORIDE 0.9% FLUSH
10.0000 mL | Freq: Two times a day (BID) | INTRAVENOUS | Status: DC
  Administered 2019-07-03 – 2019-07-04 (×2): 10 mL

## 2019-07-03 MED ORDER — SODIUM CHLORIDE 0.9% FLUSH: 10.0000 mL | INTRAVENOUS | Status: DC | PRN

## 2019-07-03 MED ORDER — WARFARIN SODIUM 7.5 MG PO TABS
7.5000 mg | ORAL_TABLET | Freq: Once | ORAL | Status: AC
  Administered 2019-07-03: 7.5 mg via ORAL
  Filled 2019-07-03: qty 1

## 2019-07-03 NOTE — Progress Notes (Signed)
ANTICOAGULATION CONSULT NOTE  Pharmacy Consult for warfarin Indication: atrial fibrillation & h/o DVT  No Known Allergies  Patient Measurements: Height: 5\' 9"  (175.3 cm) Weight: 270 lb (122.5 kg) IBW/kg (Calculated) : 70.7  Vital Signs: Temp: 97.7 F (36.5 C) (12/23 0552) Temp Source: Oral (12/23 0552) BP: 112/72 (12/23 0552) Pulse Rate: 103 (12/23 0552)  Labs: Recent Labs    06/30/19 1754 06/30/19 2001 06/30/19 2141 07/01/19 0455 07/02/19 0645 07/02/19 1458  HGB 14.7  --   --  14.4 14.5  --   HCT 44.4  --   --  42.0 44.4  --   PLT 50*  --   --  48* 48*  --   LABPROT  --   --  17.3* 18.3* 21.7*  --   INR  --   --  1.4* 1.5* 1.9*  --   CREATININE 1.40*  --   --  1.08  --  0.93  TROPONINIHS 13 16  --   --   --   --     Estimated Creatinine Clearance: 98.3 mL/min (by C-G formula based on SCr of 0.93 mg/dL).   Medical History: Past Medical History:  Diagnosis Date  . Atrial fibrillation ([place])   . DVT (deep venous thrombosis) ([facility])     Medications:  Scheduled:  . dexamethasone (DECADRON) injection  6 mg Intravenous Daily  . guaiFENesin  600 mg Oral BID  . sodium chloride flush  3 mL Intravenous Once  . Warfarin - Pharmacist Dosing Inpatient   Does not apply q1800    Assessment: Patient arrived for near syncope w/ h/o afib, DVT on warfarin and presents w/ AMS to ED w/ qSOFA 2/3 and 3/4 SIRS s/t early pneumonia development on CXR. Patient also w/ plts of 50 on arrival (no h/o of baseline).  Patient was previously on eliquis, but stopped d/t financial issues and placed on warfarin per clinic note 12/9.  Patient takes warfarin 7.5 mg daily PTA.  The previous pharmacist spoke to the hospitalist about the need for bridging since patient is in active afib and has h/o DVT, and discussed holding bridge therapy for now, since patient's plts were 50 (possibly s/t sepsis) and bridging may add additional risk of bleeding and continued thrombocytopenia  DDIs: azithromycin,  ceftazidime  Date   INR   Dose 12/20   1.4   (pta 7.5mg  + 2.5mg  inpatient) 12/21   1.5   7.5 mg 12/22   1.9   7.5 mg  12/23   2.0   7.5 mg   Goal of Therapy:  INR 2-3 Monitor platelets by anticoagulation protocol: Yes   Plan:   INR is at the lower end of the therapeutic level but trending up: repeat home dose of warfarin 7.5mg    adjust dose per INR trends and monitor daily CBCs.   Dallie Piles, PharmD, BCPS Clinical Pharmacist 07/03/2019 7:22 AM

## 2019-07-03 NOTE — Plan of Care (Signed)
Patient continues to be tachypneic at times with elevated temperature, improved with tylenol and cooling blanket.  Pending transfer to [facility].

## 2019-07-03 NOTE — Care Management Important Message (Signed)
Important Message  Patient Details  Name: Erubiel Schmahl MRN: [id number] Date of Birth: [birth year]   Medicare Important Message Given:  Yes - Important Message mailed due to current National Emergency  Reviewed verbally over phone with wife, Arunas Ishman, at [phone].  Requested copy be mailed to home address on file.    Dannette Barbara 07/03/2019, 11:23 AM

## 2019-07-03 NOTE — Progress Notes (Signed)
Nurse reports increasing oxygen need and some dyspnea with acivity. + fluid balance in setting of COVID pneumonitis. Renal function stable with lab review. 40 mg IV lasix ordered

## 2019-07-04 ENCOUNTER — Encounter (HOSPITAL_COMMUNITY): Payer: Self-pay | Admitting: Internal Medicine

## 2019-07-04 ENCOUNTER — Other Ambulatory Visit: Payer: Self-pay

## 2019-07-04 ENCOUNTER — Inpatient Hospital Stay (HOSPITAL_COMMUNITY)
Admission: AD | Admit: 2019-07-04 | Discharge: 2019-07-12 | DRG: 177 | Disposition: A | Discharge status: Home Health | Payer: Medicare HMO | Source: Other Acute Inpatient Hospital | Attending: Internal Medicine | Admitting: Internal Medicine

## 2019-07-04 DIAGNOSIS — Y848 Other medical procedures as the cause of abnormal reaction of the patient, or of later complication, without mention of misadventure at the time of the procedure: Secondary | ICD-10-CM | POA: Diagnosis not present

## 2019-07-04 DIAGNOSIS — T380X5A Adverse effect of glucocorticoids and synthetic analogues, initial encounter: Secondary | ICD-10-CM | POA: Diagnosis not present

## 2019-07-04 DIAGNOSIS — U071 COVID-19: Principal | ICD-10-CM

## 2019-07-04 DIAGNOSIS — J9601 Acute respiratory failure with hypoxia: Secondary | ICD-10-CM | POA: Diagnosis present

## 2019-07-04 DIAGNOSIS — I82453 Acute embolism and thrombosis of peroneal vein, bilateral: Secondary | ICD-10-CM | POA: Diagnosis present

## 2019-07-04 DIAGNOSIS — I82431 Acute embolism and thrombosis of right popliteal vein: Secondary | ICD-10-CM | POA: Diagnosis not present

## 2019-07-04 DIAGNOSIS — Z7901 Long term (current) use of anticoagulants: Secondary | ICD-10-CM

## 2019-07-04 DIAGNOSIS — Z87891 Personal history of nicotine dependence: Secondary | ICD-10-CM | POA: Diagnosis not present

## 2019-07-04 DIAGNOSIS — D6959 Other secondary thrombocytopenia: Secondary | ICD-10-CM | POA: Diagnosis present

## 2019-07-04 DIAGNOSIS — Z86718 Personal history of other venous thrombosis and embolism: Secondary | ICD-10-CM | POA: Diagnosis not present

## 2019-07-04 DIAGNOSIS — I4891 Unspecified atrial fibrillation: Secondary | ICD-10-CM | POA: Diagnosis not present

## 2019-07-04 DIAGNOSIS — J1282 Pneumonia due to coronavirus disease 2019: Secondary | ICD-10-CM | POA: Diagnosis present

## 2019-07-04 DIAGNOSIS — J1289 Other viral pneumonia: Secondary | ICD-10-CM

## 2019-07-04 DIAGNOSIS — D696 Thrombocytopenia, unspecified: Secondary | ICD-10-CM | POA: Diagnosis not present

## 2019-07-04 DIAGNOSIS — E669 Obesity, unspecified: Secondary | ICD-10-CM | POA: Diagnosis present

## 2019-07-04 DIAGNOSIS — I82443 Acute embolism and thrombosis of tibial vein, bilateral: Secondary | ICD-10-CM | POA: Diagnosis present

## 2019-07-04 DIAGNOSIS — T8039XA Other ABO incompatibility reaction due to transfusion of blood or blood products, initial encounter: Secondary | ICD-10-CM | POA: Diagnosis present

## 2019-07-04 DIAGNOSIS — E876 Hypokalemia: Secondary | ICD-10-CM | POA: Diagnosis not present

## 2019-07-04 DIAGNOSIS — I482 Chronic atrial fibrillation, unspecified: Secondary | ICD-10-CM | POA: Diagnosis not present

## 2019-07-04 DIAGNOSIS — Z6841 Body Mass Index (BMI) 40.0 and over, adult: Secondary | ICD-10-CM | POA: Diagnosis not present

## 2019-07-04 DIAGNOSIS — I824Y3 Acute embolism and thrombosis of unspecified deep veins of proximal lower extremity, bilateral: Secondary | ICD-10-CM | POA: Diagnosis not present

## 2019-07-04 DIAGNOSIS — G9341 Metabolic encephalopathy: Secondary | ICD-10-CM | POA: Diagnosis not present

## 2019-07-04 DIAGNOSIS — R791 Abnormal coagulation profile: Secondary | ICD-10-CM | POA: Diagnosis not present

## 2019-07-04 LAB — C-REACTIVE PROTEIN: CRP: 15.5 mg/dL — ABNORMAL HIGH (ref <1.0)

## 2019-07-04 LAB — CBC
HCT: 42.4 % (ref 39.0–52.0)
Hemoglobin: 14.1 g/dL (ref 13.0–17.0)
MCH: 28.3 pg (ref 26.0–34.0)
MCHC: 33.3 g/dL (ref 30.0–36.0)
MCV: 85.1 fL (ref 80.0–100.0)
Platelets: 53 10*3/uL — ABNORMAL LOW (ref 150–400)
RBC: 4.98 MIL/uL (ref 4.22–5.81)
RDW: 14.2 % (ref 11.5–15.5)
WBC: 4 10*3/uL (ref 4.0–10.5)
nRBC: 0 % (ref 0.0–0.2)

## 2019-07-04 LAB — BASIC METABOLIC PANEL
Anion gap: 11 (ref 5–15)
BUN: 18 mg/dL (ref 8–23)
CO2: 24 mmol/L (ref 22–32)
Calcium: 7.6 mg/dL — ABNORMAL LOW (ref 8.9–10.3)
Chloride: 103 mmol/L (ref 98–111)
Creatinine, Ser: 0.84 mg/dL (ref 0.61–1.24)
GFR calc Af Amer: >60 mL/min (ref >60)
GFR calc non Af Amer: >60 mL/min (ref >60)
Glucose, Bld: 98 mg/dL (ref 70–99)
Potassium: 3.5 mmol/L (ref 3.5–5.1)
Sodium: 138 mmol/L (ref 135–145)

## 2019-07-04 LAB — PROTIME-INR
INR: 2.1 — ABNORMAL HIGH (ref 0.8–1.2)
Prothrombin Time: 23.7 seconds — ABNORMAL HIGH (ref 11.4–15.2)

## 2019-07-04 LAB — URINE CULTURE: Culture: NO GROWTH

## 2019-07-04 LAB — ABO/RH: ABO/RH(D): B POS

## 2019-07-04 LAB — MAGNESIUM: Magnesium: 2.5 mg/dL — ABNORMAL HIGH (ref 1.7–2.4)

## 2019-07-04 MED ORDER — WARFARIN SODIUM 7.5 MG PO TABS
7.5000 mg | ORAL_TABLET | Freq: Once | ORAL | Status: AC
  Administered 2019-07-05: 7.5 mg via ORAL
  Filled 2019-07-04: qty 1

## 2019-07-04 MED ORDER — FUROSEMIDE 10 MG/ML IJ SOLN
20.0000 mg | Freq: Once | INTRAMUSCULAR | Status: AC
  Administered 2019-07-04: 20 mg via INTRAVENOUS
  Filled 2019-07-04: qty 2

## 2019-07-04 MED ORDER — DOCUSATE SODIUM 100 MG PO CAPS
100.0000 mg | ORAL_CAPSULE | Freq: Two times a day (BID) | ORAL | Status: DC
  Administered 2019-07-06 – 2019-07-12 (×5): 100 mg via ORAL
  Filled 2019-07-04 (×9): qty 1

## 2019-07-04 MED ORDER — GUAIFENESIN ER 600 MG PO TB12: 600.0000 mg | ORAL_TABLET | Freq: Two times a day (BID) | ORAL | Status: DC

## 2019-07-04 MED ORDER — BENZONATATE 100 MG PO CAPS: 200.0000 mg | ORAL_CAPSULE | Freq: Three times a day (TID) | ORAL | Status: DC | PRN

## 2019-07-04 MED ORDER — WARFARIN - PHARMACIST DOSING INPATIENT: Freq: Every day | Status: DC

## 2019-07-04 MED ORDER — SODIUM CHLORIDE 0.9% IV SOLUTION: Freq: Once | INTRAVENOUS | Status: AC

## 2019-07-04 MED ORDER — POTASSIUM CHLORIDE CRYS ER 20 MEQ PO TBCR
40.0000 meq | EXTENDED_RELEASE_TABLET | Freq: Once | ORAL | Status: DC
  Filled 2019-07-04: qty 2

## 2019-07-04 MED ORDER — HYDROCHLOROTHIAZIDE 25 MG PO TABS: ORAL_TABLET | ORAL | Status: DC

## 2019-07-04 MED ORDER — SODIUM CHLORIDE 0.9 % IV SOLN
500.0000 mg | INTRAVENOUS | Status: AC
  Administered 2019-07-05 – 2019-07-07 (×3): 500 mg via INTRAVENOUS
  Filled 2019-07-04 (×4): qty 500

## 2019-07-04 MED ORDER — METHYLPREDNISOLONE SODIUM SUCC 125 MG IJ SOLR
60.0000 mg | Freq: Two times a day (BID) | INTRAMUSCULAR | Status: DC
  Administered 2019-07-04 – 2019-07-07 (×6): 60 mg via INTRAVENOUS
  Filled 2019-07-04 (×7): qty 2

## 2019-07-04 MED ORDER — FUROSEMIDE 10 MG/ML IJ SOLN
40.0000 mg | Freq: Once | INTRAMUSCULAR | Status: AC
  Administered 2019-07-04: 18:00:00 40 mg via INTRAVENOUS
  Filled 2019-07-04: qty 4

## 2019-07-04 MED ORDER — ZINC SULFATE 220 (50 ZN) MG PO CAPS
220.0000 mg | ORAL_CAPSULE | Freq: Every day | ORAL | Status: DC
  Administered 2019-07-04 – 2019-07-12 (×9): 220 mg via ORAL
  Filled 2019-07-04 (×9): qty 1

## 2019-07-04 MED ORDER — ONDANSETRON HCL 4 MG/2ML IJ SOLN: 4.0000 mg | Freq: Four times a day (QID) | INTRAMUSCULAR | Status: DC | PRN

## 2019-07-04 MED ORDER — SODIUM CHLORIDE 0.9 % IV SOLN: 2.0000 g | Freq: Three times a day (TID) | INTRAVENOUS | Status: DC

## 2019-07-04 MED ORDER — DEXAMETHASONE SODIUM PHOSPHATE 10 MG/ML IJ SOLN: 6.0000 mg | Freq: Every day | INTRAMUSCULAR | 0 refills | Status: DC

## 2019-07-04 MED ORDER — METOPROLOL TARTRATE 25 MG PO TABS
25.0000 mg | ORAL_TABLET | Freq: Two times a day (BID) | ORAL | Status: DC
  Administered 2019-07-05 – 2019-07-10 (×10): 25 mg via ORAL
  Filled 2019-07-04 (×13): qty 1

## 2019-07-04 MED ORDER — ALBUTEROL SULFATE HFA 108 (90 BASE) MCG/ACT IN AERS: 2.0000 | INHALATION_SPRAY | Freq: Four times a day (QID) | RESPIRATORY_TRACT | Status: DC | PRN

## 2019-07-04 MED ORDER — ADULT MULTIVITAMIN W/MINERALS CH
1.0000 | ORAL_TABLET | Freq: Every day | ORAL | Status: DC
  Administered 2019-07-04 – 2019-07-12 (×9): 1 via ORAL
  Filled 2019-07-04 (×9): qty 1

## 2019-07-04 MED ORDER — ACETAMINOPHEN 325 MG PO TABS: 650.0000 mg | ORAL_TABLET | Freq: Four times a day (QID) | ORAL | Status: DC | PRN

## 2019-07-04 MED ORDER — SODIUM CHLORIDE 0.9 % IV SOLN
100.0000 mg | Freq: Every day | INTRAVENOUS | Status: AC
  Administered 2019-07-05: 09:00:00 100 mg via INTRAVENOUS
  Filled 2019-07-04: qty 20

## 2019-07-04 MED ORDER — ALBUTEROL SULFATE HFA 108 (90 BASE) MCG/ACT IN AERS
2.0000 | INHALATION_SPRAY | Freq: Four times a day (QID) | RESPIRATORY_TRACT | Status: DC | PRN
  Administered 2019-07-04 – 2019-07-09 (×5): 2 via RESPIRATORY_TRACT
  Filled 2019-07-04: qty 6.7

## 2019-07-04 MED ORDER — WARFARIN SODIUM 7.5 MG PO TABS
7.5000 mg | ORAL_TABLET | Freq: Once | ORAL | Status: DC
  Filled 2019-07-04: qty 1

## 2019-07-04 MED ORDER — SODIUM CHLORIDE 0.9 % IV SOLN
2.0000 g | Freq: Three times a day (TID) | INTRAVENOUS | Status: AC
  Administered 2019-07-04 – 2019-07-09 (×15): 2 g via INTRAVENOUS
  Filled 2019-07-04 (×17): qty 2

## 2019-07-04 MED ORDER — ASCORBIC ACID 500 MG PO TABS
500.0000 mg | ORAL_TABLET | Freq: Every day | ORAL | Status: DC
  Administered 2019-07-04 – 2019-07-12 (×9): 500 mg via ORAL
  Filled 2019-07-04 (×9): qty 1

## 2019-07-04 MED ORDER — IPRATROPIUM-ALBUTEROL 20-100 MCG/ACT IN AERS
4.0000 | INHALATION_SPRAY | Freq: Four times a day (QID) | RESPIRATORY_TRACT | Status: DC
  Administered 2019-07-04 – 2019-07-10 (×22): 4 via RESPIRATORY_TRACT
  Filled 2019-07-04: qty 4

## 2019-07-04 MED ORDER — SODIUM CHLORIDE 0.9 % IV SOLN: 500.0000 mg | INTRAVENOUS | Status: DC

## 2019-07-04 MED ORDER — ONDANSETRON HCL 4 MG PO TABS: 4.0000 mg | ORAL_TABLET | Freq: Four times a day (QID) | ORAL | Status: DC | PRN

## 2019-07-04 NOTE — Discharge Summary (Addendum)
Physician Discharge Summary   Neil Bailey  male DOB: [birth year]  AY:[id number]  PCP: [name], MD  Admit date: 06/30/2019 Discharge date: 07/04/2019  Admitted From: home Disposition:  [facility] CODE STATUS: Full code Updated wife about the transfer and confirmed Full code status.  Discharge Instructions    Diet - low sodium heart healthy   Complete by: As directed       Hospital Course:  For full details, please see H&P, progress notes, consult notes and ancillary notes.  Briefly,  Neil Bailey is a52 y.o.Caucasian M with hx DVT and Afib on warfarin who presented with presyncope.  Patient was in usual state of health until few days prior to presentation.  New cough. On the day of admission, pt was "mildly confused and lethargic" per spouse, was out shopping and had episode of lightheadedness and near collapse in store.   In the ER, febrile to 102.77F, HR 110, RR 16-40. Cr 1.4. COVID negative for Ag initially, then later tested positive for RNA. CXR showed pneumonia left mid lung. CT head unremarkable. ECG showed AFib, no ischemia.  Pt was started on the typical COVID regimen of Remedivir and steroid on presentation, and was on ~2L suppl O2 yesterday.  Pt had fever, hypotension, and mild lactic acid elevation yesterday afternoon, so received 1L NS and abx changed to ceftazidime.  Procal remained neg.  Overnight, pt's O2 requirement went up to 5L, so night team ordered IV Lasix 40 mg x1, and pt diuresed well (though no strict I/O because pt was incontinent).  Pt's O2 requirement didn't improve with the diuretic, and continued to be febrile to 103, and tachypneic on 5L O2, so decision was made to initiate transfer to [facility].  Wife updated on the phone about the need for transfer.  Coronavirus PNA with acute hypoxic respiratory failure Patient presented with fever, tachycardia, hypoxia, and pneumonia on chest x-ray in the setting of the  positive COVID.  Inflammatory markers mild elevation.  O2 requirement increased to 5L (from 2L) overnight, with tachypnea and more lethargy.  Persistently febrile to to >103.  Remdesivir day 3 of 5, IV dexamethasone day 3 today.  On admission, no evidence of end organ damage or sepsis, but ceftriaxone and azithromycin were started for possible concomitant bacterial pneumonia. Abx were switched to ceftazidime when pt was febrile and hypotensive yesterday.  Procal 0.16, 0.64, 0.52.  UA neg for infection.  Blood cultures so far negative.  Acute toxic metabolic encephalopathy Patient denies alcohol use, doubt withdrawal.  Suspect this is delirium from fever/sepsis.  AKI 2/2 dehydration, POA, resolved Cr 1.4 on admission,after IVF hydration, Cr down to 0.98 today.  Atrial fibrillation, chronic HR mostly controlled <110's.  No rate control agents on pt's home med list.  Continued home warfarin under pharmacy dosing.  History DVT Continued home warfarin under pharmacy dosing.  Leukopenia Acute on chronic thrombocytopenia  Platelets 50's since presentation, but stable (baseline 120s).  Worsening thrombocytopenia likely from acute infection.  No clinical bleeding.   Discharge Diagnoses:  Active Problems:   CAP (community acquired pneumonia)   COVID-19    Discharge Instructions:  Allergies as of 07/04/2019   No Known Allergies     Medication List    STOP taking these medications   doxycycline 100 MG capsule Commonly known as: VIBRAMYCIN     TAKE these medications   albuterol 108 (90 Base) MCG/ACT inhaler Commonly known as: VENTOLIN HFA Inhale 2 puffs into the lungs every  6 (six) hours as needed for wheezing or shortness of breath.   azithromycin 500 mg in sodium chloride 0.9 % 250 mL Inject 500 mg into the vein daily.   benzonatate 200 MG capsule Commonly known as: TESSALON Take 200 mg by mouth 3 (three) times daily as needed.   cefTAZidime 2 g in sodium chloride  0.9 % 100 mL Inject 2 g into the vein every 8 (eight) hours.   dexamethasone 10 MG/ML injection Commonly known as: DECADRON Inject 0.6 mLs (6 mg total) into the vein daily.   guaiFENesin 600 MG 12 hr tablet Commonly known as: MUCINEX Take 1 tablet (600 mg total) by mouth 2 (two) times daily.   hydrochlorothiazide 25 MG tablet Commonly known as: HYDRODIURIL Hold due to low BP What changed:   how much to take  how to take this  when to take this  additional instructions   warfarin 5 MG tablet Commonly known as: COUMADIN Take 7.5 mg by mouth daily.         No Known Allergies   The results of significant diagnostics from this hospitalization (including imaging, microbiology, ancillary and laboratory) are listed below for reference.   Consultations: none   Procedures/Studies: CT Head Wo Contrast  Result Date: 06/30/2019 CLINICAL DATA:  Near syncope. EXAM: CT HEAD WITHOUT CONTRAST TECHNIQUE: Contiguous axial images were obtained from the base of the skull through the vertex without intravenous contrast. COMPARISON:  None. FINDINGS: Brain: No subdural, epidural, or subarachnoid hemorrhage. Ventricles and sulci are prominent but otherwise unremarkable. Cerebellum, brainstem, and basal cisterns are normal. White matter changes are noted. No acute cortical ischemia or infarct identified. No mass effect or midline shift identified. Vascular: No hyperdense vessel or unexpected calcification. Skull: Normal. Negative for fracture or focal lesion. Sinuses/Orbits: No acute finding. Other: None. IMPRESSION: Chronic white matter changes.  No acute intracranial abnormalities. Electronically Signed   By: Dorise Bullion III M.D   On: 06/30/2019 17:38   DG Chest Portable 1 View  Result Date: 06/30/2019 CLINICAL DATA:  Near syncopal episode. On antibiotics for cough. EXAM: PORTABLE CHEST 1 VIEW COMPARISON:  Abdominal CT 12/26/2014. No previous chest radiographs available. FINDINGS: 1643  hours. Stable mild cardiomegaly exaggerated by prominent epicardial fat as correlated with previous CT. There is patchy airspace disease in the left perihilar region which could reflect early pneumonia. The right lung appears clear. There is no edema, pneumothorax or significant pleural effusion. The bones appear unremarkable. Telemetry leads overlie the chest. IMPRESSION: 1. Patchy airspace disease in the left perihilar region suspicious for early pneumonia. Followup PA and lateral chest X-ray is recommended in 3-4 weeks following completion of antibiotic therapy to ensure resolution and exclude underlying malignancy. 2. Stable mild cardiomegaly exaggerated by prominent epicardial fat. Electronically Signed   By: Richardean Sale M.D.   On: 06/30/2019 17:13      Labs: BNP (last 3 results) No results for input(s): BNP in the last 8760 hours. Basic Metabolic Panel: Recent Labs  Lab 06/30/19 1754 06/30/19 2141 07/01/19 0455 07/02/19 1458 07/03/19 0700  NA 134*  --  138 134* 137  K 3.5  --  3.6 3.4* 3.1*  CL 95*  --  100 100 98  CO2 29  --  25 24 26   GLUCOSE 108*  --  154* 123* 118*  BUN 22  --  19 20 21   CREATININE 1.40*  --  1.08 0.93 0.98  CALCIUM 8.1*  --  8.0* 7.7* 7.8*  MG  --  2.0  --   --  2.1   Liver Function Tests: Recent Labs  Lab 07/02/19 0645  ALT 19   No results for input(s): LIPASE, AMYLASE in the last 168 hours. No results for input(s): AMMONIA in the last 168 hours. CBC: Recent Labs  Lab 06/30/19 1754 07/01/19 0455 07/02/19 0645 07/03/19 0700  WBC 5.0 2.1* 4.2 4.3  HGB 14.7 14.4 14.5 15.0  HCT 44.4 42.0 44.4 44.5  MCV 86.9 83.7 87.4 84.9  PLT 50* 48* 48* 54*   Cardiac Enzymes: No results for input(s): CKTOTAL, CKMB, CKMBINDEX, TROPONINI in the last 168 hours. BNP: Invalid input(s): POCBNP CBG: Recent Labs  Lab 06/30/19 1951 07/01/19 1731 07/02/19 0857  GLUCAP 95 110* 100*   D-Dimer No results for input(s): DDIMER in the last 72 hours. Hgb  A1c No results for input(s): HGBA1C in the last 72 hours. Lipid Profile No results for input(s): CHOL, HDL, LDLCALC, TRIG, CHOLHDL, LDLDIRECT in the last 72 hours. Thyroid function studies No results for input(s): TSH, T4TOTAL, T3FREE, THYROIDAB in the last 72 hours.  Invalid input(s): FREET3 Anemia work up No results for input(s): VITAMINB12, FOLATE, FERRITIN, TIBC, IRON, RETICCTPCT in the last 72 hours. Urinalysis    Component Value Date/Time   COLORURINE AMBER (A) 07/02/2019 1852   APPEARANCEUR CLEAR (A) 07/02/2019 1852   LABSPEC 1.033 (H) 07/02/2019 1852   PHURINE 5.0 07/02/2019 1852   GLUCOSEU NEGATIVE 07/02/2019 1852   HGBUR NEGATIVE 07/02/2019 1852   BILIRUBINUR NEGATIVE 07/02/2019 1852   KETONESUR 5 (A) 07/02/2019 1852   PROTEINUR 30 (A) 07/02/2019 1852   NITRITE NEGATIVE 07/02/2019 1852   LEUKOCYTESUR NEGATIVE 07/02/2019 1852   Sepsis Labs Invalid input(s): PROCALCITONIN,  WBC,  LACTICIDVEN Microbiology Recent Results (from the past 240 hour(s))  CULTURE, BLOOD (ROUTINE X 2) w Reflex to ID Panel     Status: None (Preliminary result)   Collection Time: 06/30/19  9:41 PM   Specimen: BLOOD  Result Value Ref Range Status   Specimen Description BLOOD RIGHT ANTECUBITAL  Final   Special Requests   Final    BOTTLES DRAWN AEROBIC AND ANAEROBIC Blood Culture results may not be optimal due to an excessive volume of blood received in culture bottles   Culture   Final    NO GROWTH 3 DAYS Performed at [facility], [address], [place], [place] [address]    Report Status PENDING  Incomplete  CULTURE, BLOOD (ROUTINE X 2) w Reflex to ID Panel     Status: None (Preliminary result)   Collection Time: 06/30/19  9:41 PM   Specimen: BLOOD  Result Value Ref Range Status   Specimen Description BLOOD LEFT ANTECUBITAL  Final   Special Requests   Final    BOTTLES DRAWN AEROBIC AND ANAEROBIC Blood Culture adequate volume   Culture   Final    NO GROWTH 3 DAYS Performed at  [facility], [address] [place], [place] [address]    Report Status PENDING  Incomplete  SARS CORONAVIRUS 2 (TAT 6-24 HRS) Nasopharyngeal Nasopharyngeal Swab     Status: Abnormal   Collection Time: 06/30/19  9:55 PM   Specimen: Nasopharyngeal Swab  Result Value Ref Range Status   SARS Coronavirus 2 POSITIVE (A) NEGATIVE Final    Comment: RESULT CALLED TO, READ BACK BY AND VERIFIED WITH: Pryor Montes RN 10:00 07/01/19 ([email or web address]) (NOTE) SARS-CoV-2 target nucleic acids are DETECTED. The SARS-CoV-2 RNA is generally detectable in upper and lower respiratory specimens during the acute phase  of infection. Positive results are indicative of the presence of SARS-CoV-2 RNA. Clinical correlation with patient history and other diagnostic information is  necessary to determine patient infection status. Positive results do not rule out bacterial infection or co-infection with other viruses.  The expected result is Negative. Fact Sheet for Patients: SugarRoll.be Fact Sheet for Healthcare Providers: https://www.woods-mathews.com/ This test is not yet approved or cleared by the [place] FDA and  has been authorized for detection and/or diagnosis of SARS-CoV-2 by FDA under an Emergency Use Authorization (EUA). This EUA will remain  in effect (meaning this test can be used) for  the duration of the COVID-19 declaration under Section 564(b)(1) of the Act, 21 U.S.C. section 360bbb-3(b)(1), unless the authorization is terminated or revoked sooner. Performed at [facility], [place] [address], [place], [place] [address]      Total time spend on discharging this patient, including the last patient exam, discussing the hospital stay, instructions for ongoing care as it relates to all pertinent caregivers, as well as preparing the medical discharge records, prescriptions, and/or referrals as applicable, is 30 minutes.    Enzo Bi, MD  [facility] 07/04/2019, 7:43 AM  If 7PM-7AM, please contact night-coverage

## 2019-07-04 NOTE — H&P (Signed)
Patient was initially hospitalized at [facility].  Please review the H&P and discharge summaries in the chart.  S: Briefly this is a 68 year old Caucasian male with a past medical history of atrial fibrillation on warfarin, history of DVT, former smoker, who initially presented with presyncope.  He was also noted to have a new cough.  Was noted to be mildly confused according to his spouse.  In the emergency department he was noted to be febrile.  He tested positive for COVID-19.  Chest x-ray showed pneumonia.  CT head did not show any acute findings.  EKG showed atrial fibrillation.  Initially he was requiring 2 L of oxygen.  He was hospitalized at [facility].  He was started on steroids and remdesivir.  His oxygen requirement slowly started climbing.  Subsequently he was transferred here to [facility].  Patient noted to be mildly distracted but able to answer all my questions.  He denies any chest pain.  He states that he is having some difficulty breathing.  He had a dry cough.  Denies any headaches.  O: He is currently saturating in the late 80s on 5 L by nasal cannula.  He does not have increased work of breathing currently.  Noted to be mildly tachycardic.  Blood pressure is normal.  Respiratory rate is about 20-24.  Lungs reveal crackles at the bases left more than right.  No wheezing or rhonchi.  Mildly tachypneic.  No use of accessory muscles. S1-S2 is irregularly irregular.  No S3-S4. Abdomen soft.  Nontender nondistended No lower extremity edema is appreciated.  No calf tenderness. He is alert oriented x3.  No obvious focal neurological deficits.  Basic metabolic panel from earlier today reviewed.  Potassium was 3.5.  CBC shows thrombocytopenia, otherwise unremarkable.  INR 2.1.  CRP was last checked 4 days ago and was 6.    A/P:  1. Pneumonia due to COVID-19/acute respiratory failure with hypoxia: Patient was started on remdesivir.  He has received 4 doses of same.  Complete a 5-day  course.  Continue with steroids.  We have changed over to Solu-Medrol.  We will check inflammatory markers.  Check LFTs tomorrow morning.  Patient was also started on antibiotics due to persistent fever and mildly elevated procalcitonin.  Continue with azithromycin and Tressie Ellis for now.  Recheck procalcitonin level tomorrow.  Convalescent plasma was discussed with the patient.  Even though it is somewhat late in the course he may still show some benefit.  He has read through the consent form.  He has signed the consent forms.  Form is in the shadow chart.  I will order plasma.  He may also benefit from some Lasix which will also be ordered.  He will be given a dose of potassium.  2. History of atrial fibrillation, chronic: He is not noted to be on any rate limiting drugs at home.  He is on warfarin.  He is noted to be slightly tachycardic.  Could consider low-dose beta-blocker.  3. History of DVT: Continue warfarin  4. Thrombocytopenia: This appears to be new.  Likely due to acute infection.  No evidence of bleeding.  Continue to monitor.  Continue to follow the patient closely.  Bonnielee Haff 07/04/2019 6:08 PM

## 2019-07-04 NOTE — Progress Notes (Signed)
Notified spouse of progress all questions answered and this nurse's number shared for further communication. Patient talked to wife.

## 2019-07-04 NOTE — Progress Notes (Signed)
ANTICOAGULATION CONSULT NOTE  Pharmacy Consult for warfarin Indication: atrial fibrillation & h/o DVT  No Known Allergies  Patient Measurements: Height: 5\' 9"  (175.3 cm) Weight: 270 lb (122.5 kg) IBW/kg (Calculated) : 70.7  Vital Signs: Temp: 98.4 F (36.9 C) (12/24 0939) Temp Source: Oral (12/24 0939) BP: 115/80 (12/24 0939) Pulse Rate: 95 (12/24 0939)  Labs: Recent Labs    07/02/19 0645 07/02/19 1458 07/03/19 0700 07/04/19 0536 07/04/19 0831  HGB 14.5  --  15.0  --  14.1  HCT 44.4  --  44.5  --  42.4  PLT 48*  --  54*  --  53*  LABPROT 21.7*  --  22.6* 23.7*  --   INR 1.9*  --  2.0* 2.1*  --   CREATININE  --  0.93 0.98  --  0.84    Estimated Creatinine Clearance: 108.8 mL/min (by C-G formula based on SCr of 0.84 mg/dL).   Medical History: Past Medical History:  Diagnosis Date  . Atrial fibrillation ([place])   . DVT (deep venous thrombosis) ([facility])     Medications:  Scheduled:  . dexamethasone (DECADRON) injection  6 mg Intravenous Daily  . guaiFENesin  600 mg Oral BID  . sodium chloride flush  10-40 mL Intracatheter Q12H  . sodium chloride flush  3 mL Intravenous Once  . Warfarin - Pharmacist Dosing Inpatient   Does not apply q1800    Assessment: Patient arrived for near syncope w/ h/o afib, DVT on warfarin and presents w/ AMS to ED w/ qSOFA 2/3 and 3/4 SIRS s/t early pneumonia development on CXR. Patient also w/ plts of 50 on arrival (no h/o of baseline).  Patient was previously on eliquis, but stopped d/t financial issues and placed on warfarin per clinic note 12/9.  Patient takes warfarin 7.5 mg daily PTA.  The previous pharmacist spoke to the hospitalist about the need for bridging since patient is in active afib and has h/o DVT, and discussed holding bridge therapy for now, since patient's plts were 50 (possibly s/t sepsis) and bridging may add additional risk of bleeding and continued thrombocytopenia  DDIs: azithromycin,  ceftazidime  Date   INR   Dose 12/20   1.4   (pta 7.5mg  + 2.5mg  inpatient) 12/21   1.5   7.5 mg 12/22   1.9   7.5 mg  12/23   2.0   7.5 mg  12/24   2.1  Goal of Therapy:  INR 2-3 Monitor platelets by anticoagulation protocol: Yes   Plan:   INR is at the lower end of the therapeutic level but slowly trending up: repeat home dose of warfarin 7.5mg    DDI: Azithromycin, (also on abx ceftazidime)  adjust dose per INR trends and monitor daily CBCs.   Noralee Space, PharmD, BCPS Clinical Pharmacist 07/04/2019 9:45 AM

## 2019-07-04 NOTE — Consult Note (Signed)
Pharmacy Antibiotic Note  Neil Bailey is a 68 y.o. male admitted on 06/30/2019 with pneumonia.  Pharmacy has been consulted for ceftazidime dosing. He has received 2 days of azithromycin and ceftriaxone but is clinically worsening, therefore coverage is being broadened.  Plan: -ceftazidime 2 grams IV every 8 hours  Height: 5\' 9"  (175.3 cm) Weight: 270 lb (122.5 kg) IBW/kg (Calculated) : 70.7  Temp (24hrs), Avg:98.7 F (37.1 C), Min:98.2 F (36.8 C), Max:99.2 F (37.3 C)  Recent Labs  Lab 06/30/19 1754 06/30/19 2001 06/30/19 2141 07/01/19 0455 07/02/19 0645 07/02/19 1458 07/02/19 1817 07/03/19 0700 07/04/19 0831  WBC 5.0  --   --  2.1* 4.2  --   --  4.3 4.0  CREATININE 1.40*  --   --  1.08  --  0.93  --  0.98 0.84  LATICACIDVEN  --  1.6 1.1  --   --  2.0* 1.0  --   --     Estimated Creatinine Clearance: 108.8 mL/min (by C-G formula based on SCr of 0.84 mg/dL).    No Known Allergies  Antimicrobials this admission: ceftriaxone 12/20 >> 12/22 remdesivir 12/21 >> 12/26 azithromycin 12/20 >>  Ceftazidime 12/22 >>  Microbiology results: 12/20 BCx: NGTD 12/20 SARS Co-V 2: positive   Thank you for allowing pharmacy to be a part of this patient's care.  [name] A 07/04/2019 12:46 PM

## 2019-07-04 NOTE — Progress Notes (Signed)
PROGRESS NOTE    Neil Bailey  [id number] DOB: [birth year] DOA: 06/30/2019 PCP: [name], MD    Assessment & Plan:   Active Problems:   CAP (community acquired pneumonia)   Pneumonia due to COVID-19 virus   Neil Bailey is a 68 y.o. Caucasian male with medical history significant for DVT and Afib on warfarin who presented with presyncope.  Patient was in usual state of health until few daysprior to presentation. New cough.On theday of admission, ptwas "mildly confused and lethargic" per spouse, was out shopping and had episode of lightheadedness and near collapse in store.   CoronavirusPNAwith acute hypoxic respiratory failure --Patient presented with fever, tachycardia, hypoxia, and pneumonia on chest x-ray in the setting of thepositive COVID. Inflammatory markers mild elevation. On admission, no evidence of end organ damage or sepsis, butceftriaxone and azithromycinwere started for possible concomitant bacterial pneumonia.Abx were switched to ceftazidime when pt was febrile and hypotensive yesterday. Procal 0.16, 0.64, 0.52. UA neg for infection. Blood cultures so far negative. --O2 requirement increased to 5L (from 2L) overnight, with tachypnea and more lethargy. Persistently febrile to to >103.  PLAN: --continue Remdesivir and IVdexamethasone  --continue azithromycin and ceftazidime for empiric coverage  --Due to increased O2 requirement and worsening encephalopathy, transfer request has been made for pt to go to [place] [place] [facility] Patient denies alcohol use, doubt withdrawal. Suspect this is delirium from fever/sepsis. Delirium precautions:              -Lights and TV off, minimize interruptions at night             -Blinds open and lights on during day             -Glasses/hearing aid with patient             -Frequent reorientation             -PT/OT when able             -Avoid sedation  medications/[place]ers list medications  AKI 2/2 dehydration, POA, resolved Cr 1.4 on admission,after IVF hydration, Cr improved.  Atrial fibrillation, chronic HR mostly controlled <110's. No rate control agents on pt's home med list.  --continue home warfarin under pharmacy dosing.  History DVT --continue home warfarin under pharmacy dosing.  Leukopenia Acute on chronic thrombocytopenia Platelets50's since presentation, but stable(baseline 120s). Worsening thrombocytopenia likely from acute infection. No clinical bleeding.   DVT prophylaxis: [id number] Code Status: Full code  Family Communication: Updated wife on the phone about plan to transfer to [facility] Disposition Plan: Transfer to [facility]   Subjective and Interval History:  Overnight, pt's O2 requirement went up to 5L, so night team ordered IV Lasix 40 mg x1, and pt diuresed well (though no strict I/O because pt was incontinent). Pt's O2 requirement didn't improve with the diuretic, and continued to be febrile to 103, and tachypneic on 5L O2, so decision was made to initiate transfer to [facility].    Pt was confused today and not able to answer ROS questions.     Objective: Vitals:   07/04/19 0852 07/04/19 0939 07/04/19 1006 07/04/19 1232  BP: 115/80 115/80 132/86 134/70  Pulse: 95 95 (!) 107 94  Resp: (!) 24 (!) 24 (!) 24 (!) 24  Temp: 98.4 F (36.9 C) 98.4 F (36.9 C) 98.5 F (36.9 C) 99.2 F (37.3 C)  TempSrc: Oral Oral Oral Oral  SpO2: 92% 92% 95% 95%  Weight:  Height:       No intake or output data in the 24 hours ending 07/07/19 1421 Filed Weights   06/30/19 1558  Weight: 122.5 kg    Examination:   Constitutional: Somnolent, not oriented, arousable to voices HEENT: conjunctivae and lids normal, EOMI CV: RRR. Distal pulses +2.  No cyanosis.   RESP: CTA B/L over anterior, shallow rapid breaths, on 5L GI: +BS, NTND Extremities: No effusions, edema, or tenderness  in BLE SKIN: warm, dry and intact Neuro: II - XII grossly intact.  Sensation intact   Data Reviewed: I have personally reviewed following labs and imaging studies  CBC: Recent Labs  Lab 07/03/19 0700 07/04/19 0831 07/05/19 0517 07/06/19 0443 07/07/19 0225  WBC 4.3 4.0 4.7 4.9 8.7  NEUTROABS  --   --  3.5 3.9 7.1  HGB 15.0 14.1 15.8 12.6* 12.8*  HCT 44.5 42.4 49.7 38.8* 40.2  MCV 84.9 85.1 89.7 88.0 89.1  PLT 54* 53* 75* 80* 123XX123*   Basic Metabolic Panel: Recent Labs  Lab 07/03/19 0700 07/04/19 0831 07/05/19 0517 07/06/19 0443 07/07/19 0225  NA 137 138 138 138 140  K 3.1* 3.5 3.4* 3.9 3.9  CL 98 103 100 103 104  CO2 26 24 23 26 25   GLUCOSE 118* 98 162* 140* 141*  BUN 21 18 21 22 22   CREATININE 0.98 0.84 1.09 0.92 0.90  CALCIUM 7.8* 7.6* 7.9* 8.2* 8.4*  MG 2.1 2.5* 2.1 2.3 2.4   GFR: Estimated Creatinine Clearance: 101.6 mL/min (by C-G formula based on SCr of 0.9 mg/dL). Liver Function Tests: Recent Labs  Lab 07/02/19 0645 07/05/19 0517 07/06/19 0443 07/07/19 0225  AST  --  44* 37 35  ALT 19 22 24 27   ALKPHOS  --  38 35* 36*  BILITOT  --  1.1 0.7 0.5  PROT  --  6.7 5.8* 5.9*  ALBUMIN  --  3.2* 2.9* 2.9*   No results for input(s): LIPASE, AMYLASE in the last 168 hours. No results for input(s): AMMONIA in the last 168 hours. Coagulation Profile: Recent Labs  Lab 07/03/19 0700 07/04/19 0536 07/05/19 1404 07/06/19 0443 07/07/19 0225  INR 2.0* 2.1* 2.5* 2.9* 3.2*   Cardiac Enzymes: No results for input(s): CKTOTAL, CKMB, CKMBINDEX, TROPONINI in the last 168 hours. BNP (last 3 results) No results for input(s): PROBNP in the last 8760 hours. HbA1C: No results for input(s): HGBA1C in the last 72 hours. CBG: Recent Labs  Lab 06/30/19 1951 07/01/19 1731 07/02/19 0857  GLUCAP 95 110* 100*   Lipid Profile: No results for input(s): CHOL, HDL, LDLCALC, TRIG, CHOLHDL, LDLDIRECT in the last 72 hours. Thyroid Function Tests: No results for input(s):  TSH, T4TOTAL, FREET4, T3FREE, THYROIDAB in the last 72 hours. Anemia Panel: No results for input(s): VITAMINB12, FOLATE, FERRITIN, TIBC, IRON, RETICCTPCT in the last 72 hours. Sepsis Labs: Recent Labs  Lab 06/30/19 2001 06/30/19 2141 07/02/19 0645 07/02/19 1458 07/02/19 1817 07/03/19 0700 07/05/19 0517 07/06/19 0443  PROCALCITON  --   --  0.64  --   --  0.52 0.68 0.28  LATICACIDVEN 1.6 1.1  --  2.0* 1.0  --   --   --     Recent Results (from the past 240 hour(s))  CULTURE, BLOOD (ROUTINE X 2) w Reflex to ID Panel     Status: None   Collection Time: 06/30/19  9:41 PM   Specimen: BLOOD  Result Value Ref Range Status   Specimen Description BLOOD RIGHT ANTECUBITAL  Final  Special Requests   Final    BOTTLES DRAWN AEROBIC AND ANAEROBIC Blood Culture results may not be optimal due to an excessive volume of blood received in culture bottles   Culture   Final    NO GROWTH 5 DAYS Performed at [facility], [address] [place], [place] [address]    Report Status 07/05/2019 FINAL  Final  CULTURE, BLOOD (ROUTINE X 2) w Reflex to ID Panel     Status: None   Collection Time: 06/30/19  9:41 PM   Specimen: BLOOD  Result Value Ref Range Status   Specimen Description BLOOD LEFT ANTECUBITAL  Final   Special Requests   Final    BOTTLES DRAWN AEROBIC AND ANAEROBIC Blood Culture adequate volume   Culture   Final    NO GROWTH 5 DAYS Performed at [facility], [address], [place], [place] [address]    Report Status 07/05/2019 FINAL  Final  SARS CORONAVIRUS 2 (TAT 6-24 HRS) Nasopharyngeal Nasopharyngeal Swab     Status: Abnormal   Collection Time: 06/30/19  9:55 PM   Specimen: Nasopharyngeal Swab  Result Value Ref Range Status   SARS Coronavirus 2 POSITIVE (A) NEGATIVE Final    Comment: RESULT CALLED TO, READ BACK BY AND VERIFIED WITH: Pryor Montes RN 10:00 07/01/19 ([email or web address]) (NOTE) SARS-CoV-2 target nucleic acids are DETECTED. The SARS-CoV-2 RNA is generally  detectable in upper and lower respiratory specimens during the acute phase of infection. Positive results are indicative of the presence of SARS-CoV-2 RNA. Clinical correlation with patient history and other diagnostic information is  necessary to determine patient infection status. Positive results do not rule out bacterial infection or co-infection with other viruses.  The expected result is Negative. Fact Sheet for Patients: SugarRoll.be Fact Sheet for Healthcare Providers: https://www.woods-mathews.com/ This test is not yet approved or cleared by the [place] FDA and  has been authorized for detection and/or diagnosis of SARS-CoV-2 by FDA under an Emergency Use Authorization (EUA). This EUA will remain  in effect (meaning this test can be used) for  the duration of the COVID-19 declaration under Section 564(b)(1) of the Act, 21 U.S.C. section 360bbb-3(b)(1), unless the authorization is terminated or revoked sooner. Performed at [facility], [place] [address] [place], [place] [address]   Urine Culture     Status: None   Collection Time: 07/02/19  6:52 PM   Specimen: Urine, Random  Result Value Ref Range Status   Specimen Description   Final    URINE, RANDOM Performed at [facility], [address] [place], [place] [address]    Special Requests   Final    NONE Performed at [facility], [address], [place], [place] [address]    Culture   Final    NO GROWTH Performed at [facility], [place] [address] [place], [place] [address]    Report Status 07/04/2019 FINAL  Final      Radiology Studies: No results found.   Scheduled Meds: Continuous Infusions:   LOS: 4 days     Enzo Bi, MD [facility] If 7PM-7AM, please contact night-coverage 07/07/2019, 2:21 PM

## 2019-07-04 NOTE — Progress Notes (Signed)
NP notified that [facility] called to inform that they would be on their way to get patient. NP stated that she called [facility] back to postpone the transfer until after shift ends. Will continue to monitor patient.

## 2019-07-04 NOTE — Plan of Care (Signed)
Spoke with patient and spouse about care plan. No questions at this time.

## 2019-07-05 ENCOUNTER — Inpatient Hospital Stay (HOSPITAL_COMMUNITY): Payer: Medicare HMO

## 2019-07-05 DIAGNOSIS — D696 Thrombocytopenia, unspecified: Secondary | ICD-10-CM

## 2019-07-05 DIAGNOSIS — I482 Chronic atrial fibrillation, unspecified: Secondary | ICD-10-CM

## 2019-07-05 DIAGNOSIS — J9601 Acute respiratory failure with hypoxia: Secondary | ICD-10-CM

## 2019-07-05 LAB — MAGNESIUM: Magnesium: 2.1 mg/dL (ref 1.7–2.4)

## 2019-07-05 LAB — CBC WITH DIFFERENTIAL/PLATELET
Abs Immature Granulocytes: 0.16 10*3/uL — ABNORMAL HIGH (ref 0.00–0.07)
Basophils Absolute: 0 10*3/uL (ref 0.0–0.1)
Basophils Relative: 1 %
Eosinophils Absolute: 0 10*3/uL (ref 0.0–0.5)
Eosinophils Relative: 0 %
HCT: 49.7 % (ref 39.0–52.0)
Hemoglobin: 15.8 g/dL (ref 13.0–17.0)
Immature Granulocytes: 3 %
Lymphocytes Relative: 6 %
Lymphs Abs: 0.3 10*3/uL — ABNORMAL LOW (ref 0.7–4.0)
MCH: 28.5 pg (ref 26.0–34.0)
MCHC: 31.8 g/dL (ref 30.0–36.0)
MCV: 89.7 fL (ref 80.0–100.0)
Monocytes Absolute: 0.6 10*3/uL (ref 0.1–1.0)
Monocytes Relative: 14 %
Neutro Abs: 3.5 10*3/uL (ref 1.7–7.7)
Neutrophils Relative %: 76 %
Platelets: 75 10*3/uL — ABNORMAL LOW (ref 150–400)
RBC: 5.54 MIL/uL (ref 4.22–5.81)
RDW: 14.3 % (ref 11.5–15.5)
WBC: 4.7 10*3/uL (ref 4.0–10.5)
nRBC: 0 % (ref 0.0–0.2)

## 2019-07-05 LAB — CULTURE, BLOOD (ROUTINE X 2)
Culture: NO GROWTH
Culture: NO GROWTH
Special Requests: ADEQUATE

## 2019-07-05 LAB — COMPREHENSIVE METABOLIC PANEL
ALT: 22 U/L (ref 0–44)
AST: 44 U/L — ABNORMAL HIGH (ref 15–41)
Albumin: 3.2 g/dL — ABNORMAL LOW (ref 3.5–5.0)
Alkaline Phosphatase: 38 U/L (ref 38–126)
Anion gap: 15 (ref 5–15)
BUN: 21 mg/dL (ref 8–23)
CO2: 23 mmol/L (ref 22–32)
Calcium: 7.9 mg/dL — ABNORMAL LOW (ref 8.9–10.3)
Chloride: 100 mmol/L (ref 98–111)
Creatinine, Ser: 1.09 mg/dL (ref 0.61–1.24)
GFR calc Af Amer: >60 mL/min (ref >60)
GFR calc non Af Amer: >60 mL/min (ref >60)
Glucose, Bld: 162 mg/dL — ABNORMAL HIGH (ref 70–99)
Potassium: 3.4 mmol/L — ABNORMAL LOW (ref 3.5–5.1)
Sodium: 138 mmol/L (ref 135–145)
Total Bilirubin: 1.1 mg/dL (ref 0.3–1.2)
Total Protein: 6.7 g/dL (ref 6.5–8.1)

## 2019-07-05 LAB — TRANSFUSION REACTION
DAT C3: NEGATIVE
Post RXN DAT IgG: NEGATIVE

## 2019-07-05 LAB — TYPE AND SCREEN
ABO/RH(D): A POS
Antibody Screen: NEGATIVE

## 2019-07-05 LAB — PREPARE FRESH FROZEN PLASMA: Unit division: 0

## 2019-07-05 LAB — BPAM FFP
Blood Product Expiration Date: 202012252245
ISSUE DATE / TIME: 202012242338
Unit Type and Rh: 7300

## 2019-07-05 LAB — D-DIMER, QUANTITATIVE: D-Dimer, Quant: 3.26 ug/mL-FEU — ABNORMAL HIGH (ref 0.00–0.50)

## 2019-07-05 LAB — PROTIME-INR
INR: 2.5 — ABNORMAL HIGH (ref 0.8–1.2)
Prothrombin Time: 27.3 seconds — ABNORMAL HIGH (ref 11.4–15.2)

## 2019-07-05 LAB — C-REACTIVE PROTEIN: CRP: 6.5 mg/dL — ABNORMAL HIGH (ref <1.0)

## 2019-07-05 LAB — PROCALCITONIN: Procalcitonin: 0.68 ng/mL

## 2019-07-05 LAB — SAMPLE TO BLOOD BANK

## 2019-07-05 IMAGING — DX DG CHEST 1V PORT
1 series · 1 of 1 positions shown · non-contrast
Comparison: [DATE] portable chest.

CLINICAL DATA: 68-year-old male positive [FI]. Shortness of
breath.

EXAM:
PORTABLE CHEST 1 VIEW

[chest]
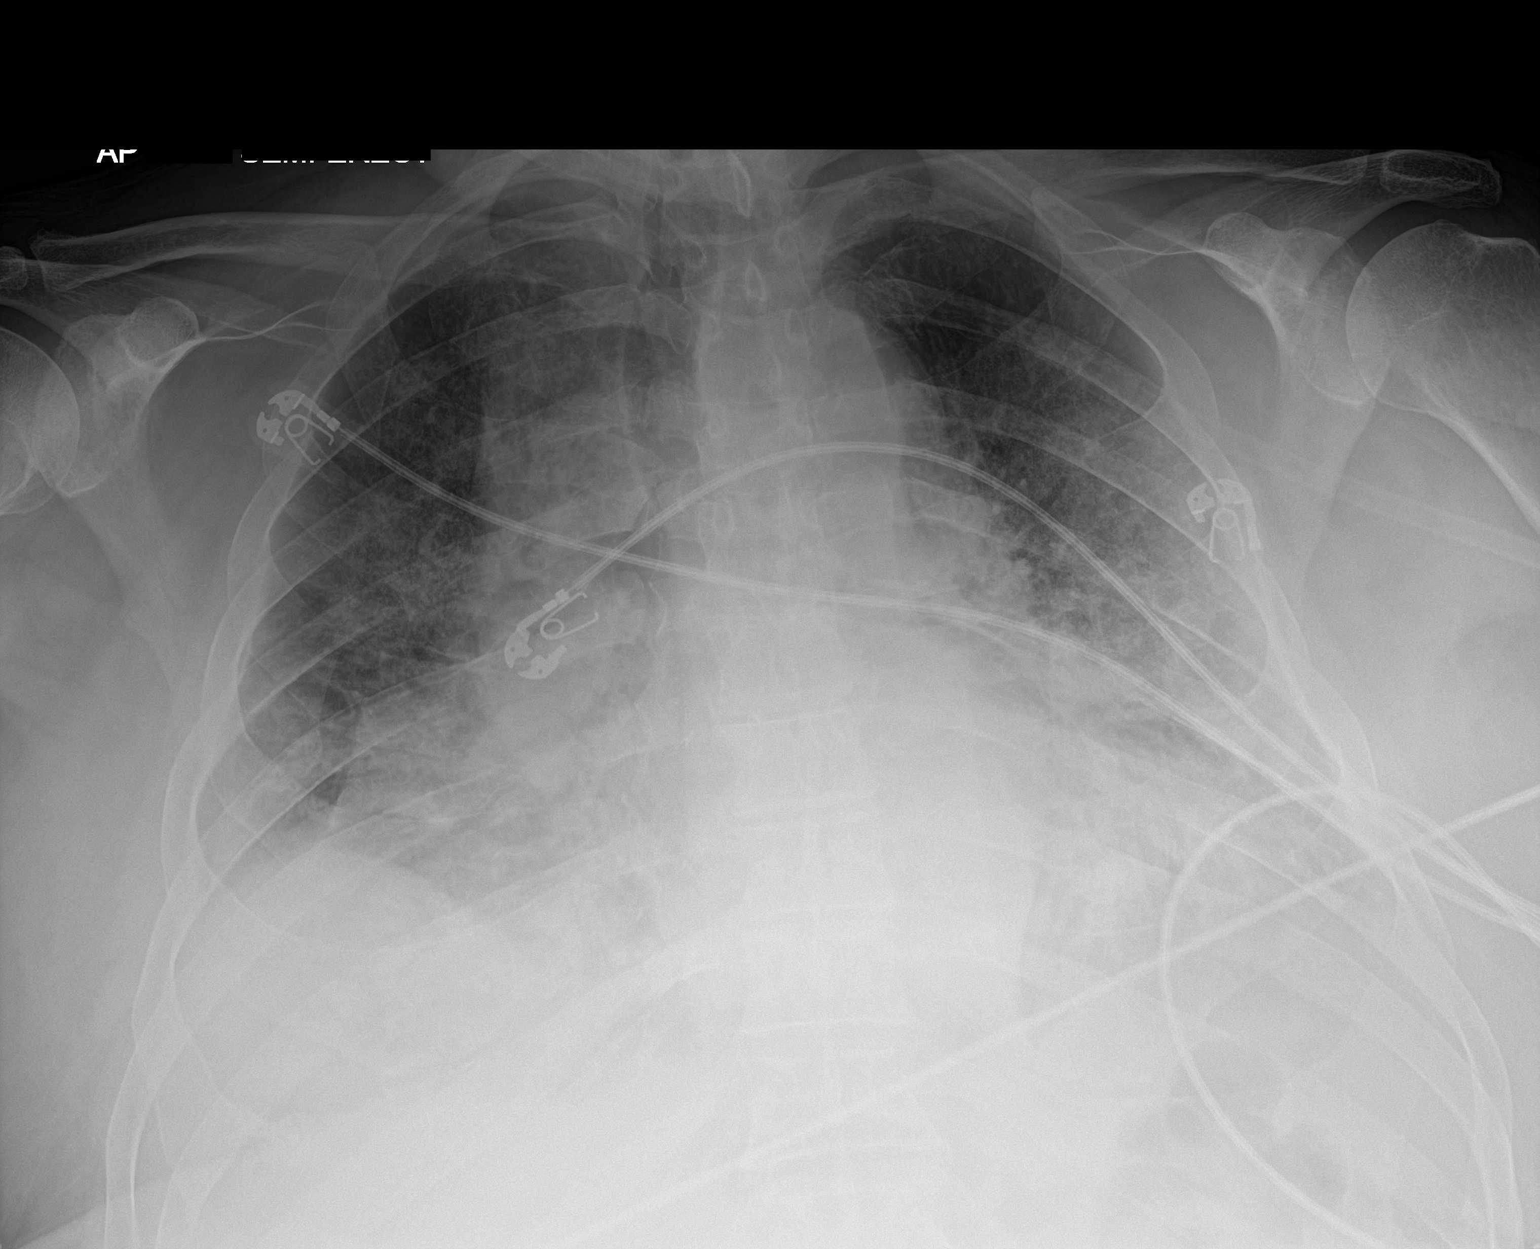

[1 of 1 positions shown; findings below may reference images not displayed]

FINDINGS: Portable AP semi upright view at [FI] hours. Lower lung volumes.
Stable cardiomegaly and mediastinal contours. Increased bilateral
pulmonary reticular interstitial opacities with lower long
predominance. Decreased ventilation at the lung bases. No
pneumothorax. No definite effusion. Visualized tracheal air column
is within normal limits. No acute osseous abnormality identified.
Negative visible bowel gas pattern.
IMPRESSION: Lower lung volumes with decreased bibasilar ventilation. Suspected
progression of [FI] pneumonia since [DATE] in the form of
increased bilateral reticular interstitial opacity.

## 2019-07-05 MED ORDER — POTASSIUM CHLORIDE CRYS ER 20 MEQ PO TBCR
40.0000 meq | EXTENDED_RELEASE_TABLET | Freq: Two times a day (BID) | ORAL | Status: AC
  Administered 2019-07-05 (×2): 40 meq via ORAL
  Filled 2019-07-05 (×2): qty 2

## 2019-07-05 MED ORDER — DEXAMETHASONE SODIUM PHOSPHATE 10 MG/ML IJ SOLN
10.0000 mg | Freq: Once | INTRAMUSCULAR | Status: AC
  Administered 2019-07-05: 10 mg via INTRAVENOUS
  Filled 2019-07-05: qty 1

## 2019-07-05 MED ORDER — FUROSEMIDE 10 MG/ML IJ SOLN
40.0000 mg | Freq: Once | INTRAMUSCULAR | Status: AC
  Administered 2019-07-05: 40 mg via INTRAVENOUS
  Filled 2019-07-05: qty 4

## 2019-07-05 MED ORDER — DIPHENHYDRAMINE HCL 50 MG/ML IJ SOLN
INTRAMUSCULAR | Status: AC
  Administered 2019-07-05: 04:00:00 50 mg
  Filled 2019-07-05: qty 1

## 2019-07-05 MED ORDER — FAMOTIDINE 20 MG PO TABS
20.0000 mg | ORAL_TABLET | Freq: Every day | ORAL | Status: DC
  Administered 2019-07-05 – 2019-07-12 (×8): 20 mg via ORAL
  Filled 2019-07-05 (×8): qty 1

## 2019-07-05 MED ORDER — DIPHENHYDRAMINE HCL 50 MG/ML IJ SOLN
25.0000 mg | Freq: Four times a day (QID) | INTRAMUSCULAR | Status: DC | PRN
  Filled 2019-07-05: qty 1

## 2019-07-05 MED ORDER — FAMOTIDINE IN NACL 20-0.9 MG/50ML-% IV SOLN
20.0000 mg | Freq: Once | INTRAVENOUS | Status: AC
  Administered 2019-07-05: 04:00:00 20 mg via INTRAVENOUS
  Filled 2019-07-05: qty 50

## 2019-07-05 NOTE — Progress Notes (Signed)
PROGRESS NOTE  Neil Bailey [id number] DOB: 10-19-1950 DOA: 07/04/2019  PCP: [name], MD  Brief History/Interval Summary: 68 year old Caucasian male with a past medical history of atrial fibrillation on warfarin, history of DVT, former smoker, who initially presented with presyncope.  He was also noted to have a new cough.  Was noted to be mildly confused according to his spouse.  In the emergency department he was noted to be febrile.  He tested positive for COVID-19.  Chest x-ray showed pneumonia.  CT head did not show any acute findings.  EKG showed atrial fibrillation.  Initially he was requiring 2 L of oxygen.  He was hospitalized at [facility].  He was started on steroids and remdesivir.  His oxygen requirement slowly started climbing.  Subsequently he was transferred here to [facility].  Reason for Visit: Acute respiratory failure with hypoxia.  Pneumonia due to COVID-19.  Consultants: None  Procedures: None  Antibiotics: Anti-infectives (From admission, onward)   Start     Dose/Rate Route Frequency Ordered Stop   07/05/19 1000  remdesivir 100 mg in sodium chloride 0.9 % 100 mL IVPB     100 mg 200 mL/hr over 30 Minutes Intravenous Daily 07/04/19 1806 07/05/19 0958   07/05/19 0200  azithromycin (ZITHROMAX) 500 mg in sodium chloride 0.9 % 250 mL IVPB     500 mg 250 mL/hr over 60 Minutes Intravenous Every 24 hours 07/04/19 1723 07/08/19 0159   07/04/19 1730  cefTAZidime (FORTAZ) 2 g in sodium chloride 0.9 % 100 mL IVPB     2 g 200 mL/hr over 30 Minutes Intravenous Every 8 hours 07/04/19 1723        Subjective/Interval History: Patient says that he had reaction to the plasma this morning.  He had a drop in his blood pressure and felt short of breath.  Infusion was stopped.  He feels like he is back to his usual now.  Shortness of breath persists.  Occasional dry cough.  Denies any dizziness.  No chest pain.    Assessment/Plan:  Acute Hypoxic Resp.  Failure/Pneumonia due to COVID-19   Recent Labs  Lab 06/30/19 2141 07/01/19 0455 07/02/19 0645 07/03/19 0700 07/04/19 1838 07/05/19 0517  DDIMER  --   --   --   --   --  3.26*  FERRITIN 202  --   --   --   --   --   CRP 6.0*  --   --   --  15.5* 6.5*  ALT  --   --  19  --   --  22  PROCALCITON  --  0.16 0.64 0.52  --   --     Objective findings: Fever: Afebrile Oxygen requirements: Nasal cannula.  5 L/min.  Saturating in the early 90s  COVID 19 Therapeutics: Antibacterials: On Fortaz Remdesivir: Day 5 today Steroids: Solu-Medrol 60 mg twice a day Diuretics: Will give dose of Lasix today Actemra: Could not be given due to elevated procalcitonin Convalescent Plasma: Unfortunately had transfusion reaction to plasma on 12/25 Vitamin C and Zinc: Continue PUD Prophylaxis: Initiate Pepcid DVT Prophylaxis: Patient is on warfarin  Patient seems to be stable from a respiratory standpoint.  Still requiring about 5 L of oxygen.  Continue to wean down as tolerated.  Patient will complete course of remdesivir today.  He remains on steroids.  Unfortunately had a transfusion reaction to the plasma.  Could not be given Actemra due to elevated procalcitonin.  Continue with Tressie Ellis to  complete 7-day treatment.  CRP was 15.5 yesterday.  Noted to be 6.5 today.  D-dimer 3.26.  Patient is already anticoagulated with warfarin.  Patient encouraged to stay in prone position as much as possible.  Incentive spirometer.  Mobilization.  Chest x-ray did show worsening in the pneumonia compared to the one done at admission.  Lasix x1 today.  There was some concern for sepsis at the time of admission.  Sepsis has been ruled out.  History of chronic atrial fibrillation Not on any rate limiting drugs at home.  Started on low-dose beta-blocker here in the hospital.  He is anticoagulated with warfarin which is being continued.  Continue to monitor on telemetry.  History of DVT Continue warfarin.  Acute metabolic  encephalopathy CT head did not show any acute findings.  Encephalopathy most likely due to acute illness from COVID-19.  No focal neurological deficits.  Continue to monitor.  Thrombocytopenia  Platelet counts noted to be in the 50s.  This is apparently new.  Most likely due to acute illness.  Continue to follow trend.  Hypokalemia Will be repleted.  Magnesium is 2.1.  Obesity Estimated body mass index is 40.37 kg/m as calculated from the following:   Height as of this encounter: 5\' 9"  (1.753 m).   Weight as of this encounter: 124 kg.   DVT Prophylaxis: Warfarin Code Status: Full code Family Communication: We will update his wife later today Disposition Plan: Management as outlined above.  Hopefully will be able to return home when improved.   Medications:  Scheduled: . vitamin C  500 mg Oral Daily  . docusate sodium  100 mg Oral BID  . Ipratropium-Albuterol  4 puff Inhalation QID  . methylPREDNISolone (SOLU-MEDROL) injection  60 mg Intravenous Q12H  . metoprolol tartrate  25 mg Oral BID  . multivitamin with minerals  1 tablet Oral Daily  . potassium chloride  40 mEq Oral Once  . warfarin  7.5 mg Oral ONCE-1800  . Warfarin - Pharmacist Dosing Inpatient   Does not apply q1800  . zinc sulfate  220 mg Oral Daily   Continuous: . azithromycin (ZITHROMAX) 500 MG IVPB (Vial-Mate [employer]or) 500 mg (07/05/19 0302)  . ceftAZIDime (FORTAZ) IVPB 2 gram/100 mL NS (Mini-Bag Plus) 2 g (07/05/19 0553)   [id number], albuterol, benzonatate, diphenhydrAMINE, ondansetron **OR** ondansetron (ZOFRAN) IV   Objective:  Vital Signs  Vitals:   07/05/19 0400 07/05/19 0500 07/05/19 0502 07/05/19 0810  BP: (!) 80/49 (!) 114/95  108/66  Pulse: (!) 106  95 100  Resp: (!) 26 (!) 29 (!) 24 (!) 22  Temp: 98 F (36.7 C)   98 F (36.7 C)  TempSrc: Oral   Oral  SpO2: (!) 88% 93% 95% 92%  Weight:      Height:        Intake/Output Summary (Last 24 hours) at 07/05/2019 1157 Last data filed  at 07/05/2019 0900 Gross per 24 hour  Intake 1012 ml  Output 500 ml  Net 512 ml   Filed Weights   07/04/19 1945  Weight: 124 kg    General appearance: Awake alert.  In no distress Resp: Mildly tachypneic at rest.  Crackles at the bases bilaterally.  No wheezing or rhonchi. Cardio: S1-S2 is normal regular.  No S3-S4.  No rubs murmurs or bruit GI: Abdomen is soft.  Nontender nondistended.  Bowel sounds are present normal.  No masses organomegaly Extremities: No edema.  Full range of motion of lower extremities. Neurologic: Alert and oriented x3.  No focal neurological deficits.    Lab Results:  Data Reviewed: I have personally reviewed following labs and imaging studies  CBC: Recent Labs  Lab 06/30/19 1754 07/01/19 0455 07/02/19 0645 07/03/19 0700 07/04/19 0831  WBC 5.0 2.1* 4.2 4.3 4.0  HGB 14.7 14.4 14.5 15.0 14.1  HCT 44.4 42.0 44.4 44.5 42.4  MCV 86.9 83.7 87.4 84.9 85.1  PLT 50* 48* 48* 54* 53*    Basic Metabolic Panel: Recent Labs  Lab 06/30/19 2141 07/01/19 0455 07/02/19 1458 07/03/19 0700 07/04/19 0831 07/05/19 0517  NA  --  138 134* 137 138 138  K  --  3.6 3.4* 3.1* 3.5 3.4*  CL  --  100 100 98 103 100  CO2  --  25 24 26 24 23   GLUCOSE  --  154* 123* 118* 98 162*  BUN  --  19 20 21 18 21   CREATININE  --  1.08 0.93 0.98 0.84 1.09  CALCIUM  --  8.0* 7.7* 7.8* 7.6* 7.9*  MG 2.0  --   --  2.1 2.5* 2.1    GFR: Estimated Creatinine Clearance: 84.4 mL/min (by C-G formula based on SCr of 1.09 mg/dL).  Liver Function Tests: Recent Labs  Lab 07/02/19 0645 07/05/19 0517  AST  --  44*  ALT 19 22  ALKPHOS  --  38  BILITOT  --  1.1  PROT  --  6.7  ALBUMIN  --  3.2*     Coagulation Profile: Recent Labs  Lab 06/30/19 2141 07/01/19 0455 07/02/19 0645 07/03/19 0700 07/04/19 0536  INR 1.4* 1.5* 1.9* 2.0* 2.1*    CBG: Recent Labs  Lab 06/30/19 1951 07/01/19 1731 07/02/19 0857  GLUCAP 95 110* 100*      Recent Results (from the past  240 hour(s))  CULTURE, BLOOD (ROUTINE X 2) w Reflex to ID Panel     Status: None   Collection Time: 06/30/19  9:41 PM   Specimen: BLOOD  Result Value Ref Range Status   Specimen Description BLOOD RIGHT ANTECUBITAL  Final   Special Requests   Final    BOTTLES DRAWN AEROBIC AND ANAEROBIC Blood Culture results may not be optimal due to an excessive volume of blood received in culture bottles   Culture   Final    NO GROWTH 5 DAYS Performed at [facility], [address] [place], [place] [address]    Report Status 07/05/2019 FINAL  Final  CULTURE, BLOOD (ROUTINE X 2) w Reflex to ID Panel     Status: None   Collection Time: 06/30/19  9:41 PM   Specimen: BLOOD  Result Value Ref Range Status   Specimen Description BLOOD LEFT ANTECUBITAL  Final   Special Requests   Final    BOTTLES DRAWN AEROBIC AND ANAEROBIC Blood Culture adequate volume   Culture   Final    NO GROWTH 5 DAYS Performed at [facility], [address] [place],  [address]    Report Status 07/05/2019 FINAL  Final  SARS CORONAVIRUS 2 (TAT 6-24 HRS) Nasopharyngeal Nasopharyngeal Swab     Status: Abnormal   Collection Time: 06/30/19  9:55 PM   Specimen: Nasopharyngeal Swab  Result Value Ref Range Status   SARS Coronavirus 2 POSITIVE (A) NEGATIVE Final    Comment: RESULT CALLED TO, READ BACK BY AND VERIFIED WITH: Pryor Montes RN 10:00 07/01/19 ([email or web address]) (NOTE) SARS-CoV-2 target nucleic acids are DETECTED. The SARS-CoV-2 RNA is generally detectable in upper and lower respiratory specimens during  the acute phase of infection. Positive results are indicative of the presence of SARS-CoV-2 RNA. Clinical correlation with patient history and other diagnostic information is  necessary to determine patient infection status. Positive results do not rule out bacterial infection or co-infection with other viruses.  The expected result is Negative. Fact Sheet for  Patients: SugarRoll.be Fact Sheet for Healthcare Providers: https://www.woods-mathews.com/ This test is not yet approved or cleared by the [place] FDA and  has been authorized for detection and/or diagnosis of SARS-CoV-2 by FDA under an Emergency Use Authorization (EUA). This EUA will remain  in effect (meaning this test can be used) for  the duration of the COVID-19 declaration under Section 564(b)(1) of the Act, 21 U.S.C. section 360bbb-3(b)(1), unless the authorization is terminated or revoked sooner. Performed at [facility], [place] [address] [place], [place] [address]   Urine Culture     Status: None   Collection Time: 07/02/19  6:52 PM   Specimen: Urine, Random  Result Value Ref Range Status   Specimen Description   Final    URINE, RANDOM Performed at [facility], [address] [place], [place] [address]    Special Requests   Final    NONE Performed at [facility], [address] [place], [place] [address]    Culture   Final    NO GROWTH Performed at [facility], [place] [address], [place], [place] [address]    Report Status 07/04/2019 FINAL  Final      Radiology Studies: DG CHEST PORT 1 VIEW  Result Date: 07/05/2019 CLINICAL DATA:  67 year old male positive COVID-19. Shortness of breath. EXAM: PORTABLE CHEST 1 VIEW COMPARISON:  06/30/2019 portable chest. FINDINGS: Portable AP semi upright view at 0622 hours. Lower lung volumes. Stable cardiomegaly and mediastinal contours. Increased bilateral pulmonary reticular interstitial opacities with lower long predominance. Decreased ventilation at the lung bases. No pneumothorax. No definite effusion. Visualized tracheal air column is within normal limits. No acute osseous abnormality identified. Negative visible bowel gas pattern. IMPRESSION: Lower lung volumes with decreased bibasilar ventilation. Suspected progression of COVID-19 pneumonia since  06/30/2019 in the form of increased bilateral reticular interstitial opacity. Electronically Signed   By: Genevie Ann M.D.   On: 07/05/2019 08:24       LOS: 1 day   [facility] Pager on [email or web address]  07/05/2019, 11:57 AM

## 2019-07-05 NOTE — Progress Notes (Addendum)
Blood Transfusion Reaction Investigation  Transfusion Reaction Investigation:   Patient's Clinical History:  A-fib  Interpretation of Reaction: Patient was started on plasma @0320  07/05/19 at @0400  plasma was stopped because patient had a reaction. Patient turned red and complained of SOB and worsened cough. Patient said he felt different and tingly. Patient became tachycardia with hypotension. Medications given see MAR. Vitals taken every 15 minutes.  Comments/Recommendations: Stop plasma contacted lab.   Physician Contacted:  Peyton Bottoms @0405   Lab dropped plasma off in the refreshment room and went back on the floor. Nurse called lab to come verify the unit and to sign for the bag. When the primery nurse was checking patients identifiers with second nurse she noticed there was no blood bank band and didn't hang the plasma. Nurse called lab and returned the unit stating she didn't feel comfortable giving plasma without a blood bank band from [place]. Nurse called charge to double check [facility] policy's and procedures with charge nurse. Charge nurse informed primary nurse that at [facility] you do not need a blood bank band to give plasma only blood product. Nurse then called lab reverified plasma and followed policy to administer. Transfusion reaction noted at @0400  and was stopped with two nurses at bedside. Lonni Fix, RN witnessed all above events.   Lucianne Muss, RN 07/05/2019 5:27 AM

## 2019-07-05 NOTE — Progress Notes (Signed)
Notified spouse of progress all questions answered and this nurse's number shared for further communication.  

## 2019-07-05 NOTE — Progress Notes (Signed)
[place] for warfarin Indication: atrial fibrillation & h/o DVT  Allergies  Allergen Reactions  . Plasma, Human Shortness Of Breath, Anxiety, Other (See Comments), Cough and Hypertension    Redness    Patient Measurements: Height: 5\' 9"  (175.3 cm) Weight: 273 lb 5.9 oz (124 kg) IBW/kg (Calculated) : 70.7  Vital Signs: Temp: 98 F (36.7 C) (12/25 0810) Temp Source: Oral (12/25 0810) BP: 110/90 (12/25 1655) Pulse Rate: 85 (12/25 1655)  Labs: Recent Labs    07/03/19 0700 07/04/19 0536 07/04/19 0831 07/05/19 0517 07/05/19 1404  HGB 15.0  --  14.1  --   --   HCT 44.5  --  42.4  --   --   PLT 54*  --  53*  --   --   LABPROT 22.6* 23.7*  --   --  27.3*  INR 2.0* 2.1*  --   --  2.5*  CREATININE 0.98  --  0.84 1.09  --     Estimated Creatinine Clearance: 84.4 mL/min (by C-G formula based on SCr of 1.09 mg/dL).   Medical History: Past Medical History:  Diagnosis Date  . Atrial fibrillation ([place])   . DVT (deep venous thrombosis) (H[facility])     Medications:  Scheduled:  . vitamin C  500 mg Oral Daily  . docusate sodium  100 mg Oral BID  . famotidine  20 mg Oral Daily  . Ipratropium-Albuterol  4 puff Inhalation QID  . methylPREDNISolone (SOLU-MEDROL) injection  60 mg Intravenous Q12H  . metoprolol tartrate  25 mg Oral BID  . multivitamin with minerals  1 tablet Oral Daily  . potassium chloride  40 mEq Oral BID  . Warfarin - Pharmacist Dosing Inpatient   Does not apply q1800  . zinc sulfate  220 mg Oral Daily    Assessment: Patient arrived for near syncope w/ h/o afib, DVT on warfarin and presents w/ AMS to ED w/ qSOFA 2/3 and 3/4 SIRS s/t early pneumonia development on CXR. Patient also w/ plts of 50 on arrival (no h/o of baseline).  Patient was previously on eliquis, but stopped d/t financial issues and placed on warfarin per clinic note 12/9.  Patient takes warfarin 7.5 mg daily PTA.  The previous pharmacist spoke to the  hospitalist about the need for bridging since patient is in active afib and has h/o DVT, and discussed holding bridge therapy for now, since patient's plts were 50 (possibly s/t sepsis) and bridging may add additional risk of bleeding and continued thrombocytopenia  DDIs: azithromycin, ceftazidime  Date   INR   Dose 12/20   1.4   (pta 7.5mg  + 2.5mg  inpatient) 12/21   1.5   7.5 mg 12/22   1.9   7.5 mg  12/23   2.0   7.5 mg  12/24   2.1   none 12/25   2.5   7.5 mg  Goal of Therapy:  INR 2-3 Monitor platelets by anticoagulation protocol: Yes   Plan:   INR is therapeutic at 2.5 and trending up: repeat home dose of warfarin 7.5mg    DDI: Azithromycin, (also on abx ceftazidime)  adjust dose per INR trends and monitor daily CBCs.   Berenice Bouton, PharmD PGY1 Pharmacy Resident  Please check AMION for all [place] phone numbers After 10:00 PM, call [place] [phone]  07/05/2019 5:18 PM

## 2019-07-06 LAB — CBC WITH DIFFERENTIAL/PLATELET
Abs Immature Granulocytes: 0.2 10*3/uL — ABNORMAL HIGH (ref 0.00–0.07)
Basophils Absolute: 0 10*3/uL (ref 0.0–0.1)
Basophils Relative: 0 %
Eosinophils Absolute: 0 10*3/uL (ref 0.0–0.5)
Eosinophils Relative: 0 %
HCT: 38.8 % — ABNORMAL LOW (ref 39.0–52.0)
Hemoglobin: 12.6 g/dL — ABNORMAL LOW (ref 13.0–17.0)
Immature Granulocytes: 4 %
Lymphocytes Relative: 8 %
Lymphs Abs: 0.4 10*3/uL — ABNORMAL LOW (ref 0.7–4.0)
MCH: 28.6 pg (ref 26.0–34.0)
MCHC: 32.5 g/dL (ref 30.0–36.0)
MCV: 88 fL (ref 80.0–100.0)
Monocytes Absolute: 0.4 10*3/uL (ref 0.1–1.0)
Monocytes Relative: 7 %
Neutro Abs: 3.9 10*3/uL (ref 1.7–7.7)
Neutrophils Relative %: 81 %
Platelets: 80 10*3/uL — ABNORMAL LOW (ref 150–400)
RBC: 4.41 MIL/uL (ref 4.22–5.81)
RDW: 14.3 % (ref 11.5–15.5)
WBC: 4.9 10*3/uL (ref 4.0–10.5)
nRBC: 0 % (ref 0.0–0.2)

## 2019-07-06 LAB — D-DIMER, QUANTITATIVE: D-Dimer, Quant: 3.22 ug/mL-FEU — ABNORMAL HIGH (ref 0.00–0.50)

## 2019-07-06 LAB — COMPREHENSIVE METABOLIC PANEL
ALT: 24 U/L (ref 0–44)
AST: 37 U/L (ref 15–41)
Albumin: 2.9 g/dL — ABNORMAL LOW (ref 3.5–5.0)
Alkaline Phosphatase: 35 U/L — ABNORMAL LOW (ref 38–126)
Anion gap: 9 (ref 5–15)
BUN: 22 mg/dL (ref 8–23)
CO2: 26 mmol/L (ref 22–32)
Calcium: 8.2 mg/dL — ABNORMAL LOW (ref 8.9–10.3)
Chloride: 103 mmol/L (ref 98–111)
Creatinine, Ser: 0.92 mg/dL (ref 0.61–1.24)
GFR calc Af Amer: >60 mL/min (ref >60)
GFR calc non Af Amer: >60 mL/min (ref >60)
Glucose, Bld: 140 mg/dL — ABNORMAL HIGH (ref 70–99)
Potassium: 3.9 mmol/L (ref 3.5–5.1)
Sodium: 138 mmol/L (ref 135–145)
Total Bilirubin: 0.7 mg/dL (ref 0.3–1.2)
Total Protein: 5.8 g/dL — ABNORMAL LOW (ref 6.5–8.1)

## 2019-07-06 LAB — C-REACTIVE PROTEIN: CRP: 3.3 mg/dL — ABNORMAL HIGH (ref <1.0)

## 2019-07-06 LAB — PROTIME-INR
INR: 2.9 — ABNORMAL HIGH (ref 0.8–1.2)
Prothrombin Time: 29.9 seconds — ABNORMAL HIGH (ref 11.4–15.2)

## 2019-07-06 LAB — PROCALCITONIN: Procalcitonin: 0.28 ng/mL

## 2019-07-06 LAB — MAGNESIUM: Magnesium: 2.3 mg/dL (ref 1.7–2.4)

## 2019-07-06 MED ORDER — METOPROLOL TARTRATE 5 MG/5ML IV SOLN
2.5000 mg | Freq: Four times a day (QID) | INTRAVENOUS | Status: DC | PRN
  Administered 2019-07-09 – 2019-07-10 (×3): 2.5 mg via INTRAVENOUS
  Filled 2019-07-06 (×3): qty 5

## 2019-07-06 MED ORDER — WARFARIN SODIUM 5 MG PO TABS
5.0000 mg | ORAL_TABLET | Freq: Once | ORAL | Status: AC
  Administered 2019-07-06: 17:00:00 5 mg via ORAL
  Filled 2019-07-06: qty 1

## 2019-07-06 NOTE — Progress Notes (Addendum)
PROGRESS NOTE  Neil Bailey [id number] DOB: Nov 18, 1950 DOA: 07/04/2019  PCP: [name], MD  Brief History/Interval Summary: 67 year old Caucasian male with a past medical history of atrial fibrillation on warfarin, history of DVT, former smoker, who initially presented with presyncope.  He was also noted to have a new cough.  Was noted to be mildly confused according to his spouse.  In the emergency department he was noted to be febrile.  He tested positive for COVID-19.  Chest x-ray showed pneumonia.  CT head did not show any acute findings.  EKG showed atrial fibrillation.  Initially he was requiring 2 L of oxygen.  He was hospitalized at [facility].  He was started on steroids and remdesivir.  His oxygen requirement slowly started climbing.  Subsequently he was transferred here to [facility]  Reason for Visit: Acute respiratory failure with hypoxia.  Pneumonia due to COVID-19.  Consultants: None  Procedures: None  Antibiotics: Anti-infectives (From admission, onward)   Start     Dose/Rate Route Frequency Ordered Stop   07/05/19 1000  remdesivir 100 mg in sodium chloride 0.9 % 100 mL IVPB     100 mg 200 mL/hr over 30 Minutes Intravenous Daily 07/04/19 1806 07/05/19 1900   07/05/19 0200  azithromycin (ZITHROMAX) 500 mg in sodium chloride 0.9 % 250 mL IVPB     500 mg 250 mL/hr over 60 Minutes Intravenous Every 24 hours 07/04/19 1723 07/08/19 0159   07/04/19 1730  cefTAZidime (FORTAZ) 2 g in sodium chloride 0.9 % 100 mL IVPB     2 g 200 mL/hr over 30 Minutes Intravenous Every 8 hours 07/04/19 1723 07/09/19 1359      Subjective/Interval History: Patient states that he is feeling slightly better.  Continues to have cough and shortness of breath with exertion.  Denies any nausea vomiting.      Assessment/Plan:  Acute Hypoxic Resp. Failure/Pneumonia due to COVID-19   Recent Labs  Lab 06/30/19 2141 07/01/19 0455 07/02/19 0645 07/03/19 0700 07/04/19 1838  07/05/19 0517 07/06/19 0443  DDIMER  --   --   --   --   --  3.26* 3.22*  FERRITIN 202  --   --   --   --   --   --   CRP 6.0*  --   --   --  15.5* 6.5* 3.3*  ALT  --   --  19  --   --  22 24  PROCALCITON  --  0.16 0.64 0.52  --  0.68 0.28    Objective findings: Fever: Noted to be afebrile Oxygen requirements: Nasal cannula.  5 L/min.  Saturating in the early 90s.    COVID 19 Therapeutics: Antibacterials: On Fortaz and azithromycin Remdesivir: Completed course on 12/25. Steroids: Solu-Medrol 60 mg twice a day Diuretics: He was given a dose of Lasix on 12/25.  Hold today due to borderline low blood pressures. Actemra: Could not be given due to elevated procalcitonin Convalescent Plasma: Unfortunately had transfusion reaction to plasma on 12/25 Vitamin C and Zinc: Continue PUD Prophylaxis: Pepcid DVT Prophylaxis: Patient is on warfarin  Patient seems to be stable from a respiratory standpoint.  From a symptom standpoint he is feeling better.  He still requiring 5 L of oxygen.  He has completed course of remdesivir.  He remains on steroids and antibacterials.  Could not be given Actemra due to elevated procalcitonin.  He had a reaction to convalescent plasma.  Continue to mobilize.  Incentive spirometry.  Prone positioning as much as possible.  Continue inhalers and antitussive agents.  D-dimer noted to have improved to 3.22 this morning.  He remains on warfarin.  CRP is improved to 3.3.  Procalcitonin improved to 0.28 today.  We will plan for a 7-day course of Fortaz.  5-day course of azithromycin.  History of chronic atrial fibrillation Not on any rate limiting drugs at home.  This morning he is noted to be tachycardic.  He was not given metoprolol last night due to low heart rate.  We will hold off on Lasix today.  Continue with current dose of metoprolol.  Continue to monitor on telemetry.  Continue warfarin.    History of DVT Continue warfarin.  Acute metabolic encephalopathy CT  head did not show any acute findings.  Encephalopathy most likely due to acute illness from COVID-19.  Mentation has improved.  Continue to monitor.  No focal neurological deficits.   Thrombocytopenia  Platelet counts were low most likely due to acute illness.  Now improving.  Continue to monitor.    Hypokalemia Potassium is normal today.  Magnesium 2.3.  Obesity Estimated body mass index is 40.37 kg/m as calculated from the following:   Height as of this encounter: 5\' 9"  (1.753 m).   Weight as of this encounter: 124 kg.   DVT Prophylaxis: Warfarin Code Status: Full code Family Communication: Wife being updated on a daily basis. Disposition Plan: Hopefully will be able to return home when improved.  Continue to monitor.   Medications:  Scheduled: . vitamin C  500 mg Oral Daily  . docusate sodium  100 mg Oral BID  . famotidine  20 mg Oral Daily  . Ipratropium-Albuterol  4 puff Inhalation QID  . methylPREDNISolone (SOLU-MEDROL) injection  60 mg Intravenous Q12H  . metoprolol tartrate  25 mg Oral BID  . multivitamin with minerals  1 tablet Oral Daily  . Warfarin - Pharmacist Dosing Inpatient   Does not apply q1800  . zinc sulfate  220 mg Oral Daily   Continuous: . azithromycin (ZITHROMAX) 500 MG IVPB (Vial-Mate [employer]or) 500 mg (07/06/19 0230)  . ceftAZIDime (FORTAZ) IVPB 2 gram/100 mL NS (Mini-Bag Plus) 2 g (07/06/19 0513)   [id number], albuterol, benzonatate, diphenhydrAMINE, metoprolol tartrate, ondansetron **OR** ondansetron (ZOFRAN) IV   Objective:  Vital Signs  Vitals:   07/06/19 0400 07/06/19 0513 07/06/19 0515 07/06/19 0741  BP: (!) 98/52 104/77 112/60 111/87  Pulse:  (!) 112 (!) 101 98  Resp:  (!) 23 (!) 22   Temp: 98.5 F (36.9 C) 97.9 F (36.6 C)    TempSrc: Oral Oral    SpO2: 95% 91% 91% 91%  Weight:      Height:        Intake/Output Summary (Last 24 hours) at 07/06/2019 1030 Last data filed at 07/06/2019 0755 Gross per 24 hour  Intake 1110  ml  Output 1250 ml  Net -140 ml   Filed Weights   07/04/19 1945  Weight: 124 kg    General appearance: Awake alert.  In no distress Resp: Mildly tachypneic at rest.  Coarse breath sound bilaterally with crackles at the bases.  No wheezing or rhonchi.   Cardio: S1-S2 is irregularly irregular.  Noted to be tachycardic this morning.  No S3-S4. GI: Abdomen is soft.  Nontender nondistended.  Bowel sounds are present normal.  No masses organomegaly Extremities: Improved lower extremity edema. Neurologic: Alert and oriented x3.  No focal neurological deficits.     Lab Results:  Data  Reviewed: I have personally reviewed following labs and imaging studies  CBC: Recent Labs  Lab 07/02/19 0645 07/03/19 0700 07/04/19 0831 07/05/19 0517 07/06/19 0443  WBC 4.2 4.3 4.0 4.7 4.9  NEUTROABS  --   --   --  3.5 3.9  HGB 14.5 15.0 14.1 15.8 12.6*  HCT 44.4 44.5 42.4 49.7 38.8*  MCV 87.4 84.9 85.1 89.7 88.0  PLT 48* 54* 53* 75* 80*    Basic Metabolic Panel: Recent Labs  Lab 06/30/19 2141 07/02/19 1458 07/03/19 0700 07/04/19 0831 07/05/19 0517 07/06/19 0443  NA  --  134* 137 138 138 138  K  --  3.4* 3.1* 3.5 3.4* 3.9  CL  --  100 98 103 100 103  CO2  --  24 26 24 23 26   GLUCOSE  --  123* 118* 98 162* 140*  BUN  --  20 21 18 21 22   CREATININE  --  0.93 0.98 0.84 1.09 0.92  CALCIUM  --  7.7* 7.8* 7.6* 7.9* 8.2*  MG 2.0  --  2.1 2.5* 2.1 2.3    GFR: Estimated Creatinine Clearance: 100 mL/min (by C-G formula based on SCr of 0.92 mg/dL).  Liver Function Tests: Recent Labs  Lab 07/02/19 0645 07/05/19 0517 07/06/19 0443  AST  --  44* 37  ALT 19 22 24   ALKPHOS  --  38 35*  BILITOT  --  1.1 0.7  PROT  --  6.7 5.8*  ALBUMIN  --  3.2* 2.9*     Coagulation Profile: Recent Labs  Lab 07/02/19 0645 07/03/19 0700 07/04/19 0536 07/05/19 1404 07/06/19 0443  INR 1.9* 2.0* 2.1* 2.5* 2.9*    CBG: Recent Labs  Lab 06/30/19 1951 07/01/19 1731 07/02/19 0857  GLUCAP 95  110* 100*      Recent Results (from the past 240 hour(s))  CULTURE, BLOOD (ROUTINE X 2) w Reflex to ID Panel     Status: None   Collection Time: 06/30/19  9:41 PM   Specimen: BLOOD  Result Value Ref Range Status   Specimen Description BLOOD RIGHT ANTECUBITAL  Final   Special Requests   Final    BOTTLES DRAWN AEROBIC AND ANAEROBIC Blood Culture results may not be optimal due to an excessive volume of blood received in culture bottles   Culture   Final    NO GROWTH 5 DAYS Performed at [facility], [address] [place], [place] [address]    Report Status 07/05/2019 FINAL  Final  CULTURE, BLOOD (ROUTINE X 2) w Reflex to ID Panel     Status: None   Collection Time: 06/30/19  9:41 PM   Specimen: BLOOD  Result Value Ref Range Status   Specimen Description BLOOD LEFT ANTECUBITAL  Final   Special Requests   Final    BOTTLES DRAWN AEROBIC AND ANAEROBIC Blood Culture adequate volume   Culture   Final    NO GROWTH 5 DAYS Performed at [facility], [address] [place], [place] [address]    Report Status 07/05/2019 FINAL  Final  SARS CORONAVIRUS 2 (TAT 6-24 HRS) Nasopharyngeal Nasopharyngeal Swab     Status: Abnormal   Collection Time: 06/30/19  9:55 PM   Specimen: Nasopharyngeal Swab  Result Value Ref Range Status   SARS Coronavirus 2 POSITIVE (A) NEGATIVE Final    Comment: RESULT CALLED TO, READ BACK BY AND VERIFIED WITH: Pryor Montes RN 10:00 07/01/19 ([email or web address]) (NOTE) SARS-CoV-2 target nucleic acids are DETECTED. The SARS-CoV-2 RNA is generally  detectable in upper and lower respiratory specimens during the acute phase of infection. Positive results are indicative of the presence of SARS-CoV-2 RNA. Clinical correlation with patient history and other diagnostic information is  necessary to determine patient infection status. Positive results do not rule out bacterial infection or co-infection with other viruses.  The expected result is Negative. Fact Sheet  for Patients: SugarRoll.be Fact Sheet for Healthcare Providers: https://www.woods-mathews.com/ This test is not yet approved or cleared by the [place] FDA and  has been authorized for detection and/or diagnosis of SARS-CoV-2 by FDA under an Emergency Use Authorization (EUA). This EUA will remain  in effect (meaning this test can be used) for  the duration of the COVID-19 declaration under Section 564(b)(1) of the Act, 21 U.S.C. section 360bbb-3(b)(1), unless the authorization is terminated or revoked sooner. Performed at [facility], [place] [address], [place], [place] [address]   Urine Culture     Status: None   Collection Time: 07/02/19  6:52 PM   Specimen: Urine, Random  Result Value Ref Range Status   Specimen Description   Final    URINE, RANDOM Performed at [facility], [address] [place], [place] [address]    Special Requests   Final    NONE Performed at [facility], [address] [place], [place] [address]    Culture   Final    NO GROWTH Performed at [facility], [place] [address], [place], [place] [address]    Report Status 07/04/2019 FINAL  Final      Radiology Studies: DG CHEST PORT 1 VIEW  Result Date: 07/05/2019 CLINICAL DATA:  68 year old male positive COVID-19. Shortness of breath. EXAM: PORTABLE CHEST 1 VIEW COMPARISON:  06/30/2019 portable chest. FINDINGS: Portable AP semi upright view at 0622 hours. Lower lung volumes. Stable cardiomegaly and mediastinal contours. Increased bilateral pulmonary reticular interstitial opacities with lower long predominance. Decreased ventilation at the lung bases. No pneumothorax. No definite effusion. Visualized tracheal air column is within normal limits. No acute osseous abnormality identified. Negative visible bowel gas pattern. IMPRESSION: Lower lung volumes with decreased bibasilar ventilation. Suspected progression of COVID-19 pneumonia  since 06/30/2019 in the form of increased bilateral reticular interstitial opacity. Electronically Signed   By: Genevie Ann M.D.   On: 07/05/2019 08:24       LOS: 2 days   [facility] Pager on [email or web address]  07/06/2019, 10:30 AM

## 2019-07-06 NOTE — Progress Notes (Signed)
[place] for warfarin Indication: atrial fibrillation & h/o DVT  Allergies  Allergen Reactions  . Plasma, Human Shortness Of Breath, Anxiety, Other (See Comments), Cough and Hypertension    Redness    Patient Measurements: Height: 5\' 9"  (175.3 cm) Weight: 273 lb 5.9 oz (124 kg) IBW/kg (Calculated) : 70.7  Vital Signs: Temp: 97.8 F (36.6 C) (12/26 1303) Temp Source: Oral (12/26 1303) BP: 111/60 (12/26 1303) Pulse Rate: 87 (12/26 1303)  Labs: Recent Labs    07/04/19 0536 07/04/19 0831 07/05/19 0517 07/05/19 1404 07/06/19 0443  HGB  --  14.1 15.8  --  12.6*  HCT  --  42.4 49.7  --  38.8*  PLT  --  53* 75*  --  80*  LABPROT 23.7*  --   --  27.3* 29.9*  INR 2.1*  --   --  2.5* 2.9*  CREATININE  --  0.84 1.09  --  0.92   Estimated Creatinine Clearance: 100 mL/min (by C-G formula based on SCr of 0.92 mg/dL).  Medical History: Past Medical History:  Diagnosis Date  . Atrial fibrillation ([place])   . DVT (deep venous thrombosis) (HCC)    Medications:  Scheduled:  . vitamin C  500 mg Oral Daily  . docusate sodium  100 mg Oral BID  . famotidine  20 mg Oral Daily  . Ipratropium-Albuterol  4 puff Inhalation QID  . methylPREDNISolone (SOLU-MEDROL) injection  60 mg Intravenous Q12H  . metoprolol tartrate  25 mg Oral BID  . multivitamin with minerals  1 tablet Oral Daily  . Warfarin - Pharmacist Dosing Inpatient   Does not apply q1800  . zinc sulfate  220 mg Oral Daily   Assessment: Patient arrived for near syncope w/ h/o afib, DVT on warfarin and presents w/ AMS to ED w/ qSOFA 2/3 and 3/4 SIRS s/t early pneumonia development on CXR. Patient also w/ plts of 50 on arrival (no h/o of baseline).  Patient was previously on eliquis, but stopped d/t financial issues and placed on warfarin per clinic note 12/9.  Patient takes warfarin 7.5 mg daily PTA.  DDIs: azithromycin, ceftazidime  12/26 - Fairly big jump in INR (2.5 > 2.9) after  missing dose on 12/24 and getting home dose of 7.5mg  yesterday.  Suspect antibiotics and change in oral intake has contributed to fluctuation.  No noted bleeding complications but his H/H has trended down overnight which may be dilutional.  Date   INR   Dose 12/20   1.4   (pta 7.5mg  + 2.5mg  inpatient) 12/21   1.5   7.5 mg 12/22   1.9   7.5 mg  12/23   2.0   7.5 mg  12/24   2.1 12/25   2.5   7.5 mg 12/26   2.9   5 mg Goal of Therapy:  INR 2-3 Monitor platelets by anticoagulation protocol: Yes   Plan:   INR is at the upper end of the therapeutic level 2.0-3.0.  Will reduce dose and give 5mg  today.  DDI: Azithromycin, (also on abx ceftazidime)  adjust dose per INR trends and monitor daily CBCs.   Tomasita Morrow, PharmD, [place] Clinical Pharmacist 07/06/2019 3:37 PM

## 2019-07-07 LAB — MAGNESIUM: Magnesium: 2.4 mg/dL (ref 1.7–2.4)

## 2019-07-07 LAB — CBC WITH DIFFERENTIAL/PLATELET
Abs Immature Granulocytes: 0.57 10*3/uL — ABNORMAL HIGH (ref 0.00–0.07)
Basophils Absolute: 0.1 10*3/uL (ref 0.0–0.1)
Basophils Relative: 1 %
Eosinophils Absolute: 0 10*3/uL (ref 0.0–0.5)
Eosinophils Relative: 0 %
HCT: 40.2 % (ref 39.0–52.0)
Hemoglobin: 12.8 g/dL — ABNORMAL LOW (ref 13.0–17.0)
Immature Granulocytes: 7 %
Lymphocytes Relative: 5 %
Lymphs Abs: 0.4 10*3/uL — ABNORMAL LOW (ref 0.7–4.0)
MCH: 28.4 pg (ref 26.0–34.0)
MCHC: 31.8 g/dL (ref 30.0–36.0)
MCV: 89.1 fL (ref 80.0–100.0)
Monocytes Absolute: 0.5 10*3/uL (ref 0.1–1.0)
Monocytes Relative: 6 %
Neutro Abs: 7.1 10*3/uL (ref 1.7–7.7)
Neutrophils Relative %: 81 %
Platelets: 122 10*3/uL — ABNORMAL LOW (ref 150–400)
RBC: 4.51 MIL/uL (ref 4.22–5.81)
RDW: 14.4 % (ref 11.5–15.5)
WBC: 8.7 10*3/uL (ref 4.0–10.5)
nRBC: 0 % (ref 0.0–0.2)

## 2019-07-07 LAB — COMPREHENSIVE METABOLIC PANEL
ALT: 27 U/L (ref 0–44)
AST: 35 U/L (ref 15–41)
Albumin: 2.9 g/dL — ABNORMAL LOW (ref 3.5–5.0)
Alkaline Phosphatase: 36 U/L — ABNORMAL LOW (ref 38–126)
Anion gap: 11 (ref 5–15)
BUN: 22 mg/dL (ref 8–23)
CO2: 25 mmol/L (ref 22–32)
Calcium: 8.4 mg/dL — ABNORMAL LOW (ref 8.9–10.3)
Chloride: 104 mmol/L (ref 98–111)
Creatinine, Ser: 0.9 mg/dL (ref 0.61–1.24)
GFR calc Af Amer: >60 mL/min (ref >60)
GFR calc non Af Amer: >60 mL/min (ref >60)
Glucose, Bld: 141 mg/dL — ABNORMAL HIGH (ref 70–99)
Potassium: 3.9 mmol/L (ref 3.5–5.1)
Sodium: 140 mmol/L (ref 135–145)
Total Bilirubin: 0.5 mg/dL (ref 0.3–1.2)
Total Protein: 5.9 g/dL — ABNORMAL LOW (ref 6.5–8.1)

## 2019-07-07 LAB — PROTIME-INR
INR: 3.2 — ABNORMAL HIGH (ref 0.8–1.2)
Prothrombin Time: 33 seconds — ABNORMAL HIGH (ref 11.4–15.2)

## 2019-07-07 LAB — D-DIMER, QUANTITATIVE: D-Dimer, Quant: 4.25 ug/mL-FEU — ABNORMAL HIGH (ref 0.00–0.50)

## 2019-07-07 LAB — C-REACTIVE PROTEIN: CRP: 1.6 mg/dL — ABNORMAL HIGH (ref <1.0)

## 2019-07-07 MED ORDER — METHYLPREDNISOLONE SODIUM SUCC 40 MG IJ SOLR
40.0000 mg | Freq: Two times a day (BID) | INTRAMUSCULAR | Status: DC
  Administered 2019-07-07 – 2019-07-08 (×2): 40 mg via INTRAVENOUS
  Filled 2019-07-07 (×2): qty 1

## 2019-07-07 NOTE — Progress Notes (Signed)
PROGRESS NOTE  Neil Bailey [id number] DOB: [birth year] DOA: 07/04/2019  PCP: [name], MD  Brief History/Interval Summary: 68 year old Caucasian male with a past medical history of atrial fibrillation on warfarin, history of DVT, former smoker, who initially presented with presyncope.  He was also noted to have a new cough.  Was noted to be mildly confused according to his spouse.  In the emergency department he was noted to be febrile.  He tested positive for COVID-19.  Chest x-ray showed pneumonia.  CT head did not show any acute findings.  EKG showed atrial fibrillation.  Initially he was requiring 2 L of oxygen.  He was hospitalized at [facility].  He was started on steroids and remdesivir.  His oxygen requirement slowly started climbing.  Subsequently he was transferred here to [facility].  Reason for Visit: Acute respiratory failure with hypoxia.  Pneumonia due to COVID-19.  Consultants: None  Procedures: None  Antibiotics: Anti-infectives (From admission, onward)   Start     Dose/Rate Route Frequency Ordered Stop   07/05/19 1000  remdesivir 100 mg in sodium chloride 0.9 % 100 mL IVPB     100 mg 200 mL/hr over 30 Minutes Intravenous Daily 07/04/19 1806 07/05/19 1900   07/05/19 0200  azithromycin (ZITHROMAX) 500 mg in sodium chloride 0.9 % 250 mL IVPB     500 mg 250 mL/hr over 60 Minutes Intravenous Every 24 hours 07/04/19 1723 07/07/19 0354   07/04/19 1730  cefTAZidime (FORTAZ) 2 g in sodium chloride 0.9 % 100 mL IVPB     2 g 200 mL/hr over 30 Minutes Intravenous Every 8 hours 07/04/19 1723 07/09/19 1359      Subjective/Interval History: Patient states that he is feeling better.  Still short of breath with exertion but not as bad as before.  Occasional cough.  No nausea vomiting.      Assessment/Plan:  Acute Hypoxic Resp. Failure/Pneumonia due to COVID-19   Recent Labs  Lab 06/30/19 2141 07/01/19 0455 07/02/19 0645 07/03/19 0700  07/04/19 1838 07/05/19 0517 07/06/19 0443 07/07/19 0225 07/07/19 0255  DDIMER  --   --   --   --   --  3.26* 3.22* 4.25*  --   FERRITIN 202  --   --   --   --   --   --   --   --   CRP 6.0*  --   --   --  15.5* 6.5* 3.3*  --  1.6*  ALT  --   --  19  --   --  22 24 27   --   PROCALCITON  --  0.16 0.64 0.52  --  0.68 0.28  --   --     Objective findings: Fever: Noted to be afebrile Oxygen requirements: Nasal cannula.  Down to 3 L.  Saturating in the early 90s.  COVID 19 Therapeutics: Antibacterials: On Fortaz and azithromycin Remdesivir: Completed course on 12/25. Steroids: Solu-Medrol 60 mg twice a day Diuretics: He was given a dose of Lasix on 12/25.   Actemra: Could not be given due to elevated procalcitonin Convalescent Plasma: Unfortunately had transfusion reaction to plasma on 12/25 Vitamin C and Zinc: Continue PUD Prophylaxis: Pepcid DVT Prophylaxis: Patient is on warfarin  Patient seems to be improving slowly.  He is weaned down to 3 L of oxygen.  His CRP is improved to 1.6.  D-dimer noted to be up to 4.25.  Symptomatically has improved.  He has completed a course of  remdesivir.  He remains on steroids.  Due to elevated procalcitonin level he was placed on antibacterials as well.  He will get 5-day course of azithromycin and 7-day course of Fortaz.  Remains on warfarin.  Continue incentive spirometry, mobilization, prone positioning as possible.  History of chronic atrial fibrillation Not on any rate limiting drugs at home.  Has experienced some RVR here in the hospital.  He was started on metoprolol which is being continued.  Continue warfarin.  Continue to monitor on telemetry.  History of DVT Continue warfarin.  Acute metabolic encephalopathy CT head did not show any acute findings.  Encephalopathy most likely due to acute illness from COVID-19.  Mentation has improved.  Continue to monitor.  No focal neurological deficits.   Thrombocytopenia  Platelet counts were low  most likely due to acute illness.  Now improving.  Hypokalemia Repleted  Obesity Estimated body mass index is 40.37 kg/m as calculated from the following:   Height as of this encounter: 5\' 9"  (1.753 m).   Weight as of this encounter: 124 kg.   DVT Prophylaxis: Warfarin Code Status: Full code Family Communication: Wife being updated on a daily basis. Disposition Plan: Hopefully will be able to return home when improved.  Continue to monitor.   Medications:  Scheduled: . vitamin C  500 mg Oral Daily  . docusate sodium  100 mg Oral BID  . famotidine  20 mg Oral Daily  . Ipratropium-Albuterol  4 puff Inhalation QID  . methylPREDNISolone (SOLU-MEDROL) injection  60 mg Intravenous Q12H  . metoprolol tartrate  25 mg Oral BID  . multivitamin with minerals  1 tablet Oral Daily  . Warfarin - Pharmacist Dosing Inpatient   Does not apply q1800  . zinc sulfate  220 mg Oral Daily   Continuous: . ceftAZIDime (FORTAZ) IVPB 2 gram/100 mL NS (Mini-Bag Plus) 2 g (07/07/19 1306)   [id number], albuterol, benzonatate, diphenhydrAMINE, metoprolol tartrate, ondansetron **OR** ondansetron (ZOFRAN) IV   Objective:  Vital Signs  Vitals:   07/07/19 0856 07/07/19 0910 07/07/19 1258 07/07/19 1311  BP: 118/74  111/77   Pulse: (!) 106  93   Resp: 20  18   Temp: 97.9 F (36.6 C)  97.9 F (36.6 C)   TempSrc: Axillary  Axillary   SpO2: (!) 88% 97% 96% 96%  Weight:      Height:        Intake/Output Summary (Last 24 hours) at 07/07/2019 1344 Last data filed at 07/07/2019 0919 Gross per 24 hour  Intake --  Output 1600 ml  Net -1600 ml   Filed Weights   07/04/19 1945  Weight: 124 kg    General appearance: Awake alert.  In no distress Resp: Tachypneic at rest.  No use of accessory muscles.  Crackles at the bases.  No wheezing or rhonchi.  Cardio: S1-S2 is irregularly irregular.  No rubs murmurs or bruit GI: Abdomen is soft.  Nontender nondistended.  Bowel sounds are present normal.   No masses organomegaly Extremities: No edema.  Full range of motion of lower extremities. Neurologic: Alert and oriented x3.  No focal neurological deficits.    Lab Results:  Data Reviewed: I have personally reviewed following labs and imaging studies  CBC: Recent Labs  Lab 07/03/19 0700 07/04/19 0831 07/05/19 0517 07/06/19 0443 07/07/19 0225  WBC 4.3 4.0 4.7 4.9 8.7  NEUTROABS  --   --  3.5 3.9 7.1  HGB 15.0 14.1 15.8 12.6* 12.8*  HCT 44.5 42.4 49.7 38.8* 40.2  MCV 84.9 85.1 89.7 88.0 89.1  PLT 54* 53* 75* 80* 122*    Basic Metabolic Panel: Recent Labs  Lab 07/03/19 0700 07/04/19 0831 07/05/19 0517 07/06/19 0443 07/07/19 0225  NA 137 138 138 138 140  K 3.1* 3.5 3.4* 3.9 3.9  CL 98 103 100 103 104  CO2 26 24 23 26 25   GLUCOSE 118* 98 162* 140* 141*  BUN 21 18 21 22 22   CREATININE 0.98 0.84 1.09 0.92 0.90  CALCIUM 7.8* 7.6* 7.9* 8.2* 8.4*  MG 2.1 2.5* 2.1 2.3 2.4    GFR: Estimated Creatinine Clearance: 102.2 mL/min (by C-G formula based on SCr of 0.9 mg/dL).  Liver Function Tests: Recent Labs  Lab 07/02/19 0645 07/05/19 0517 07/06/19 0443 07/07/19 0225  AST  --  44* 37 35  ALT 19 22 24 27   ALKPHOS  --  38 35* 36*  BILITOT  --  1.1 0.7 0.5  PROT  --  6.7 5.8* 5.9*  ALBUMIN  --  3.2* 2.9* 2.9*     Coagulation Profile: Recent Labs  Lab 07/03/19 0700 07/04/19 0536 07/05/19 1404 07/06/19 0443 07/07/19 0225  INR 2.0* 2.1* 2.5* 2.9* 3.2*    CBG: Recent Labs  Lab 06/30/19 1951 07/01/19 1731 07/02/19 0857  GLUCAP 95 110* 100*      Recent Results (from the past 240 hour(s))  CULTURE, BLOOD (ROUTINE X 2) w Reflex to ID Panel     Status: None   Collection Time: 06/30/19  9:41 PM   Specimen: BLOOD  Result Value Ref Range Status   Specimen Description BLOOD RIGHT ANTECUBITAL  Final   Special Requests   Final    BOTTLES DRAWN AEROBIC AND ANAEROBIC Blood Culture results may not be optimal due to an excessive volume of blood received in  culture bottles   Culture   Final    NO GROWTH 5 DAYS Performed at [facility], [address] [place], [place] [address]    Report Status 07/05/2019 FINAL  Final  CULTURE, BLOOD (ROUTINE X 2) w Reflex to ID Panel     Status: None   Collection Time: 06/30/19  9:41 PM   Specimen: BLOOD  Result Value Ref Range Status   Specimen Description BLOOD LEFT ANTECUBITAL  Final   Special Requests   Final    BOTTLES DRAWN AEROBIC AND ANAEROBIC Blood Culture adequate volume   Culture   Final    NO GROWTH 5 DAYS Performed at [facility], [address] [place], [place] [address]    Report Status 07/05/2019 FINAL  Final  SARS CORONAVIRUS 2 (TAT 6-24 HRS) Nasopharyngeal Nasopharyngeal Swab     Status: Abnormal   Collection Time: 06/30/19  9:55 PM   Specimen: Nasopharyngeal Swab  Result Value Ref Range Status   SARS Coronavirus 2 POSITIVE (A) NEGATIVE Final    Comment: RESULT CALLED TO, READ BACK BY AND VERIFIED WITH: Pryor Montes RN 10:00 07/01/19 ([email or web address]) (NOTE) SARS-CoV-2 target nucleic acids are DETECTED. The SARS-CoV-2 RNA is generally detectable in upper and lower respiratory specimens during the acute phase of infection. Positive results are indicative of the presence of SARS-CoV-2 RNA. Clinical correlation with patient history and other diagnostic information is  necessary to determine patient infection status. Positive results do not rule out bacterial infection or co-infection with other viruses.  The expected result is Negative. Fact Sheet for Patients: SugarRoll.be Fact Sheet for Healthcare Providers: https://www.woods-mathews.com/ This test is not yet approved or cleared by the [place]  FDA and  has been authorized for detection and/or diagnosis of SARS-CoV-2 by FDA under an Emergency Use Authorization (EUA). This EUA will remain  in effect (meaning this test can be used) for  the duration of the COVID-19  declaration under Section 564(b)(1) of the Act, 21 U.S.C. section 360bbb-3(b)(1), unless the authorization is terminated or revoked sooner. Performed at [facility], [place] [address] [place], [place] [address]   Urine Culture     Status: None   Collection Time: 07/02/19  6:52 PM   Specimen: Urine, Random  Result Value Ref Range Status   Specimen Description   Final    URINE, RANDOM Performed at [facility], [address], [place], [place] [address]    Special Requests   Final    NONE Performed at [facility], [address] [place], [place] [address]    Culture   Final    NO GROWTH Performed at [facility], [place] [address], [place], [place] [address]    Report Status 07/04/2019 FINAL  Final      Radiology Studies: No results found.     LOS: 3 days   Basim Bartnik [employer] on [email or web address]  07/07/2019, 1:44 PM

## 2019-07-07 NOTE — Progress Notes (Signed)
[place] for warfarin Indication: atrial fibrillation & h/o DVT  Allergies  Allergen Reactions  . Plasma, Human Shortness Of Breath, Anxiety, Other (See Comments), Cough and Hypertension    Redness    Patient Measurements: Height: 5\' 9"  (175.3 cm) Weight: 273 lb 5.9 oz (124 kg) IBW/kg (Calculated) : 70.7  Vital Signs: Temp: 97.9 F (36.6 C) (12/27 0856) Temp Source: Axillary (12/27 0856) BP: 118/74 (12/27 0856) Pulse Rate: 106 (12/27 0856)  Labs: Recent Labs    07/05/19 0517 07/05/19 1404 07/06/19 0443 07/07/19 0225  HGB 15.8  --  12.6* 12.8*  HCT 49.7  --  38.8* 40.2  PLT 75*  --  80* 122*  LABPROT  --  27.3* 29.9* 33.0*  INR  --  2.5* 2.9* 3.2*  CREATININE 1.09  --  0.92  --    Estimated Creatinine Clearance: 100 mL/min (by C-G formula based on SCr of 0.92 mg/dL).  Medical History: Past Medical History:  Diagnosis Date  . Atrial fibrillation ([place])   . DVT (deep venous thrombosis) ([facility])    Medications:  Scheduled:  . vitamin C  500 mg Oral Daily  . docusate sodium  100 mg Oral BID  . famotidine  20 mg Oral Daily  . Ipratropium-Albuterol  4 puff Inhalation QID  . methylPREDNISolone (SOLU-MEDROL) injection  60 mg Intravenous Q12H  . metoprolol tartrate  25 mg Oral BID  . multivitamin with minerals  1 tablet Oral Daily  . Warfarin - Pharmacist Dosing Inpatient   Does not apply q1800  . zinc sulfate  220 mg Oral Daily   Assessment: Patient arrived for near syncope w/ h/o afib, DVT on warfarin and presents w/ AMS to ED w/ qSOFA 2/3 and 3/4 SIRS s/t early pneumonia development on CXR. Patient also w/ plts of 50 on arrival (no h/o of baseline).  Patient was previously on eliquis, but stopped d/t financial issues and placed on warfarin per clinic note 12/9.  Patient takes warfarin 7.5 mg daily PTA.  DDIs: azithromycin, ceftazidime  12/26 - Fairly big jump in INR (2.5 > 2.9) after missing dose on 12/24 and getting home dose  of 7.5mg  yesterday.  Suspect antibiotics and change in oral intake has contributed to fluctuation.  No noted bleeding complications but his H/H has trended down overnight which may be dilutional.  Date   INR   Dose 12/20   1.4   (pta 7.5mg  + 2.5mg  inpatient) 12/21   1.5   7.5 mg 12/22   1.9   7.5 mg  12/23   2.0   7.5 mg  12/24   2.1 12/25   2.5   7.5 mg 12/26   2.9   5 mg 12/27   3.2   hold Goal of Therapy:  INR 2-3 Monitor platelets by anticoagulation protocol: Yes   Plan:   INR is supratherapeutic  Hold warfarin tonight  DDI: Azithromycin, (also on abx ceftazidime)  adjust dose per INR trends and monitor daily CBCs.   Napoleon Form, PharmD, MS Clinical Pharmacist 07/07/2019 12:18 PM

## 2019-07-08 LAB — CBC WITH DIFFERENTIAL/PLATELET
Abs Immature Granulocytes: 1.33 10*3/uL — ABNORMAL HIGH (ref 0.00–0.07)
Basophils Absolute: 0.2 10*3/uL — ABNORMAL HIGH (ref 0.0–0.1)
Basophils Relative: 1 %
Eosinophils Absolute: 0 10*3/uL (ref 0.0–0.5)
Eosinophils Relative: 0 %
HCT: 43.1 % (ref 39.0–52.0)
Hemoglobin: 13.6 g/dL (ref 13.0–17.0)
Immature Granulocytes: 8 %
Lymphocytes Relative: 5 %
Lymphs Abs: 0.8 10*3/uL (ref 0.7–4.0)
MCH: 28.3 pg (ref 26.0–34.0)
MCHC: 31.6 g/dL (ref 30.0–36.0)
MCV: 89.8 fL (ref 80.0–100.0)
Monocytes Absolute: 1.2 10*3/uL — ABNORMAL HIGH (ref 0.1–1.0)
Monocytes Relative: 7 %
Neutro Abs: 12.3 10*3/uL — ABNORMAL HIGH (ref 1.7–7.7)
Neutrophils Relative %: 79 %
Platelets: 114 10*3/uL — ABNORMAL LOW (ref 150–400)
RBC: 4.8 MIL/uL (ref 4.22–5.81)
RDW: 14.9 % (ref 11.5–15.5)
WBC: 15.8 10*3/uL — ABNORMAL HIGH (ref 4.0–10.5)
nRBC: 0 % (ref 0.0–0.2)

## 2019-07-08 LAB — COMPREHENSIVE METABOLIC PANEL
ALT: 61 U/L — ABNORMAL HIGH (ref 0–44)
AST: 54 U/L — ABNORMAL HIGH (ref 15–41)
Albumin: 3.4 g/dL — ABNORMAL LOW (ref 3.5–5.0)
Alkaline Phosphatase: 47 U/L (ref 38–126)
Anion gap: 10 (ref 5–15)
BUN: 24 mg/dL — ABNORMAL HIGH (ref 8–23)
CO2: 27 mmol/L (ref 22–32)
Calcium: 8.7 mg/dL — ABNORMAL LOW (ref 8.9–10.3)
Chloride: 104 mmol/L (ref 98–111)
Creatinine, Ser: 0.87 mg/dL (ref 0.61–1.24)
GFR calc Af Amer: >60 mL/min (ref >60)
GFR calc non Af Amer: >60 mL/min (ref >60)
Glucose, Bld: 114 mg/dL — ABNORMAL HIGH (ref 70–99)
Potassium: 4.2 mmol/L (ref 3.5–5.1)
Sodium: 141 mmol/L (ref 135–145)
Total Bilirubin: 1.2 mg/dL (ref 0.3–1.2)
Total Protein: 6.4 g/dL — ABNORMAL LOW (ref 6.5–8.1)

## 2019-07-08 LAB — C-REACTIVE PROTEIN: CRP: 1.1 mg/dL — ABNORMAL HIGH

## 2019-07-08 LAB — MAGNESIUM: Magnesium: 2.4 mg/dL (ref 1.7–2.4)

## 2019-07-08 LAB — PROTIME-INR
INR: 3.1 — ABNORMAL HIGH (ref 0.8–1.2)
Prothrombin Time: 32 s — ABNORMAL HIGH (ref 11.4–15.2)

## 2019-07-08 LAB — D-DIMER, QUANTITATIVE: D-Dimer, Quant: >20 ug/mL-FEU — ABNORMAL HIGH (ref 0.00–0.50)

## 2019-07-08 MED ORDER — METHYLPREDNISOLONE SODIUM SUCC 40 MG IJ SOLR
40.0000 mg | INTRAMUSCULAR | Status: DC
  Administered 2019-07-09 – 2019-07-10 (×2): 40 mg via INTRAVENOUS
  Filled 2019-07-08 (×2): qty 1

## 2019-07-08 MED ORDER — WARFARIN 0.5 MG HALF TABLET
0.5000 mg | ORAL_TABLET | Freq: Once | ORAL | Status: AC
  Administered 2019-07-08: 18:00:00 0.5 mg via ORAL
  Filled 2019-07-08: qty 1

## 2019-07-08 NOTE — Progress Notes (Signed)
[place] for warfarin Indication: atrial fibrillation & h/o DVT  Allergies  Allergen Reactions  . Plasma, Human Shortness Of Breath, Anxiety, Other (See Comments), Cough and Hypertension    Redness    Patient Measurements: Height: 5\' 9"  (175.3 cm) Weight: 273 lb 5.9 oz (124 kg) IBW/kg (Calculated) : 70.7  Vital Signs: Temp: 97.8 F (36.6 C) (12/28 0410) Temp Source: Axillary (12/28 0410) BP: 118/78 (12/28 0748) Pulse Rate: 118 (12/28 0748)  Labs: Recent Labs    07/06/19 0443 07/07/19 0225 07/08/19 0437  HGB 12.6* 12.8* 13.6  HCT 38.8* 40.2 43.1  PLT 80* 122* 114*  LABPROT 29.9* 33.0* 32.0*  INR 2.9* 3.2* 3.1*  CREATININE 0.92 0.90 0.87   Estimated Creatinine Clearance: 105.7 mL/min (by C-G formula based on SCr of 0.87 mg/dL).  Medical History: Past Medical History:  Diagnosis Date  . Atrial fibrillation ([place])   . DVT (deep venous thrombosis) ([facility])    Medications:  Scheduled:  . vitamin C  500 mg Oral Daily  . docusate sodium  100 mg Oral BID  . famotidine  20 mg Oral Daily  . Ipratropium-Albuterol  4 puff Inhalation QID  . methylPREDNISolone (SOLU-MEDROL) injection  40 mg Intravenous Q12H  . metoprolol tartrate  25 mg Oral BID  . multivitamin with minerals  1 tablet Oral Daily  . Warfarin - Pharmacist Dosing Inpatient   Does not apply q1800  . zinc sulfate  220 mg Oral Daily   Assessment: Patient arrived for near syncope w/ h/o afib, DVT on warfarin and presents w/ AMS to ED w/ qSOFA 2/3 and 3/4 SIRS s/t early pneumonia development on CXR. Patient also w/ plts of 50 on arrival (no h/o of baseline).  Patient was previously on eliquis, but stopped d/t financial issues and placed on warfarin per clinic note 12/9.  Patient takes warfarin 7.5 mg daily PTA.  INR 3.1 today - [place] held 12/27  Goal of Therapy:  INR 2-3 Monitor platelets by anticoagulation protocol: Yes   Plan:  Small dose of warfarin today - 0.5 mg  po x 1 Adjust dose per INR trends and monitor daily CBCs.  Thank you Anette Guarneri, PharmD  07/08/2019 9:01 AM

## 2019-07-08 NOTE — Plan of Care (Signed)
  Problem: Education: Goal: Knowledge of risk factors and measures for prevention of condition will improve Outcome: Progressing   Problem: Coping: Goal: Psychosocial and spiritual needs will be supported Outcome: Progressing   Problem: Respiratory: Goal: Will maintain a patent airway Outcome: Progressing Goal: Complications related to the disease process, condition or treatment will be avoided or minimized Outcome: Progressing   

## 2019-07-08 NOTE — Progress Notes (Signed)
PROGRESS NOTE  Neil Bailey [id number] DOB: May 03, 1951 DOA: 07/04/2019  PCP: [name], MD  Brief History/Interval Summary: 68 year old Caucasian male with a past medical history of atrial fibrillation on warfarin, history of DVT, former smoker, who initially presented with presyncope.  He was also noted to have a new cough.  Was noted to be mildly confused according to his spouse.  In the emergency department he was noted to be febrile.  He tested positive for COVID-19.  Chest x-ray showed pneumonia.  CT head did not show any acute findings.  EKG showed atrial fibrillation.  Initially he was requiring 2 L of oxygen.  He was hospitalized at [facility].  He was started on steroids and remdesivir.  His oxygen requirement slowly started climbing.  Subsequently he was transferred here to [facility].  Reason for Visit: Acute respiratory failure with hypoxia.  Pneumonia due to COVID-19.  Consultants: None  Procedures: None  Antibiotics: Anti-infectives (From admission, onward)   Start     Dose/Rate Route Frequency Ordered Stop   07/05/19 1000  remdesivir 100 mg in sodium chloride 0.9 % 100 mL IVPB     100 mg 200 mL/hr over 30 Minutes Intravenous Daily 07/04/19 1806 07/05/19 1900   07/05/19 0200  azithromycin (ZITHROMAX) 500 mg in sodium chloride 0.9 % 250 mL IVPB     500 mg 250 mL/hr over 60 Minutes Intravenous Every 24 hours 07/04/19 1723 07/07/19 0354   07/04/19 1730  cefTAZidime (FORTAZ) 2 g in sodium chloride 0.9 % 100 mL IVPB     2 g 200 mL/hr over 30 Minutes Intravenous Every 8 hours 07/04/19 1723 07/09/19 1359      Subjective/Interval History: Patient states that he is feeling better.  Shortness of breath is improving.  Occasional cough.  No chest pain.  No dizziness or lightheadedness.      Assessment/Plan:  Acute Hypoxic Resp. Failure/Pneumonia due to COVID-19   Recent Labs  Lab 07/02/19 0645 07/03/19 0700 07/04/19 1838 07/05/19 0517  07/06/19 0443 07/07/19 0225 07/07/19 0255 07/08/19 0437  DDIMER  --   --   --  3.26* 3.22* 4.25*  --  >20.00*  CRP  --   --  15.5* 6.5* 3.3*  --  1.6* 1.1*  ALT 19  --   --  22 24 27   --  61*  PROCALCITON 0.64 0.52  --  0.68 0.28  --   --   --     Objective findings: Fever: Noted to be afebrile Oxygen requirements: Nasal cannula.  3 to 4 L.  Saturating in the early 90s.  Was bumped up to 6 L overnight but has been turned down to 4 L this morning.  COVID 19 Therapeutics: Antibacterials: On Fortaz and azithromycin Remdesivir: Completed course on 12/25. Steroids: Solu-Medrol 40 mg twice a day Diuretics: He was given a dose of Lasix on 12/25.   Actemra: Could not be given due to elevated procalcitonin Convalescent Plasma: Unfortunately had transfusion reaction to plasma on 12/25 Vitamin C and Zinc: Continue PUD Prophylaxis: Pepcid DVT Prophylaxis: Patient is on warfarin  Patient seems to be gradually improving.  He is down to 4 L of oxygen.  His saturations are in the mid 90s.  Should be able to wean him down further.  CRP is improved to 1.1.  We will start tapering steroids.  He has completed course of remdesivir.  He was ordered convalescent plasma but had an transfusion reaction due to incorrect blood type.  D-dimer  noted to be greater than 20 today.  Patient is anticoagulated with warfarin with therapeutic INR.  Respiratory status has been improving.  We will check lower extremity Dopplers. He is also on antibacterials for elevated procalcitonin.  He will complete 7-day course of Fortaz and 5-day course of azithromycin.  Continue with incentive spirometry mobilization.  Out of bed to chair.  History of chronic atrial fibrillation Not on any rate limiting drugs at home.  Has experienced some RVR here in the hospital.  He was started on metoprolol which is being continued.  Heart rate noted to be tachycardic in the morning hours but subsequently gets better controlled.  Continue  telemetry for now.  He does have lower extremity edema.  Will wait on lower extremity Dopplers.  Consider additional doses of Lasix.  History of DVT Continue warfarin.  Acute metabolic encephalopathy CT head did not show any acute findings.  Encephalopathy most likely due to acute illness from COVID-19.  Seems to be back to his baseline.  Continue to monitor.    Thrombocytopenia  Platelet counts were low most likely due to acute illness.  Now improving.  Hypokalemia Repleted  Obesity Estimated body mass index is 40.37 kg/m as calculated from the following:   Height as of this encounter: 5\' 9"  (1.753 m).   Weight as of this encounter: 124 kg.   DVT Prophylaxis: Warfarin Code Status: Full code Family Communication: Wife being updated on a daily basis. Disposition Plan: Mobilize.  Ambulate in the hallway.   Medications:  Scheduled: . vitamin C  500 mg Oral Daily  . docusate sodium  100 mg Oral BID  . famotidine  20 mg Oral Daily  . Ipratropium-Albuterol  4 puff Inhalation QID  . methylPREDNISolone (SOLU-MEDROL) injection  40 mg Intravenous Q12H  . metoprolol tartrate  25 mg Oral BID  . multivitamin with minerals  1 tablet Oral Daily  . warfarin  0.5 mg Oral ONCE-1800  . Warfarin - Pharmacist Dosing Inpatient   Does not apply q1800  . zinc sulfate  220 mg Oral Daily   Continuous: . ceftAZIDime (FORTAZ) IVPB 2 gram/100 mL NS (Mini-Bag Plus) 2 g (07/08/19 0743)   [id number], albuterol, benzonatate, diphenhydrAMINE, metoprolol tartrate, ondansetron **OR** ondansetron (ZOFRAN) IV   Objective:  Vital Signs  Vitals:   07/07/19 2250 07/08/19 0038 07/08/19 0410 07/08/19 0748  BP:  95/79 118/86 118/78  Pulse: (!) 111 (!) 103 75 (!) 118  Resp:  (!) 23 (!) 26 18  Temp:  98.2 F (36.8 C) 97.8 F (36.6 C)   TempSrc:  Oral Axillary   SpO2:   95% 93%  Weight:      Height:        Intake/Output Summary (Last 24 hours) at 07/08/2019 1152 Last data filed at 07/08/2019  0914 Gross per 24 hour  Intake 360 ml  Output 1230 ml  Net -870 ml   Filed Weights   07/04/19 1945  Weight: 124 kg    General appearance: Awake alert.  In no distress Resp: Normal effort at rest.  Coarse breath sound bilaterally with crackles at the bases.  No wheezing or rhonchi. Cardio: S1-S2 is irregularly irregular.  No S3-S4.  No rubs or bruit.   GI: Abdomen is soft.  Nontender nondistended.  Bowel sounds are present normal.  No masses organomegaly Extremities: Does have lower extremity edema.   Neurologic: Alert and oriented x3.  No focal neurological deficits.    Lab Results:  Data Reviewed: I have personally  reviewed following labs and imaging studies  CBC: Recent Labs  Lab 07/04/19 0831 07/05/19 0517 07/06/19 0443 07/07/19 0225 07/08/19 0437  WBC 4.0 4.7 4.9 8.7 15.8*  NEUTROABS  --  3.5 3.9 7.1 12.3*  HGB 14.1 15.8 12.6* 12.8* 13.6  HCT 42.4 49.7 38.8* 40.2 43.1  MCV 85.1 89.7 88.0 89.1 89.8  PLT 53* 75* 80* 122* 114*    Basic Metabolic Panel: Recent Labs  Lab 07/04/19 0831 07/05/19 0517 07/06/19 0443 07/07/19 0225 07/08/19 0437  NA 138 138 138 140 141  K 3.5 3.4* 3.9 3.9 4.2  CL 103 100 103 104 104  CO2 24 23 26 25 27   GLUCOSE 98 162* 140* 141* 114*  BUN 18 21 22 22  24*  CREATININE 0.84 1.09 0.92 0.90 0.87  CALCIUM 7.6* 7.9* 8.2* 8.4* 8.7*  MG 2.5* 2.1 2.3 2.4 2.4    GFR: Estimated Creatinine Clearance: 105.7 mL/min (by C-G formula based on SCr of 0.87 mg/dL).  Liver Function Tests: Recent Labs  Lab 07/02/19 0645 07/05/19 0517 07/06/19 0443 07/07/19 0225 07/08/19 0437  AST  --  44* 37 35 54*  ALT 19 22 24 27  61*  ALKPHOS  --  38 35* 36* 47  BILITOT  --  1.1 0.7 0.5 1.2  PROT  --  6.7 5.8* 5.9* 6.4*  ALBUMIN  --  3.2* 2.9* 2.9* 3.4*     Coagulation Profile: Recent Labs  Lab 07/04/19 0536 07/05/19 1404 07/06/19 0443 07/07/19 0225 07/08/19 0437  INR 2.1* 2.5* 2.9* 3.2* 3.1*    CBG: Recent Labs  Lab 07/01/19 1731  07/02/19 0857  GLUCAP 110* 100*      Recent Results (from the past 240 hour(s))  CULTURE, BLOOD (ROUTINE X 2) w Reflex to ID Panel     Status: None   Collection Time: 06/30/19  9:41 PM   Specimen: BLOOD  Result Value Ref Range Status   Specimen Description BLOOD RIGHT ANTECUBITAL  Final   Special Requests   Final    BOTTLES DRAWN AEROBIC AND ANAEROBIC Blood Culture results may not be optimal due to an excessive volume of blood received in culture bottles   Culture   Final    NO GROWTH 5 DAYS Performed at [facility], [address] [place], [place] [address]    Report Status 07/05/2019 FINAL  Final  CULTURE, BLOOD (ROUTINE X 2) w Reflex to ID Panel     Status: None   Collection Time: 06/30/19  9:41 PM   Specimen: BLOOD  Result Value Ref Range Status   Specimen Description BLOOD LEFT ANTECUBITAL  Final   Special Requests   Final    BOTTLES DRAWN AEROBIC AND ANAEROBIC Blood Culture adequate volume   Culture   Final    NO GROWTH 5 DAYS Performed at [facility], [address] [place], [place] [address]    Report Status 07/05/2019 FINAL  Final  SARS CORONAVIRUS 2 (TAT 6-24 HRS) Nasopharyngeal Nasopharyngeal Swab     Status: Abnormal   Collection Time: 06/30/19  9:55 PM   Specimen: Nasopharyngeal Swab  Result Value Ref Range Status   SARS Coronavirus 2 POSITIVE (A) NEGATIVE Final    Comment: RESULT CALLED TO, READ BACK BY AND VERIFIED WITH: Pryor Montes RN 10:00 07/01/19 ([email or web address]) (NOTE) SARS-CoV-2 target nucleic acids are DETECTED. The SARS-CoV-2 RNA is generally detectable in upper and lower respiratory specimens during the acute phase of infection. Positive results are indicative of the presence of SARS-CoV-2 RNA. Clinical  correlation with patient history and other diagnostic information is  necessary to determine patient infection status. Positive results do not rule out bacterial infection or co-infection with other viruses.  The expected result is  Negative. Fact Sheet for Patients: SugarRoll.be Fact Sheet for Healthcare Providers: https://www.woods-mathews.com/ This test is not yet approved or cleared by the [place] FDA and  has been authorized for detection and/or diagnosis of SARS-CoV-2 by FDA under an Emergency Use Authorization (EUA). This EUA will remain  in effect (meaning this test can be used) for  the duration of the COVID-19 declaration under Section 564(b)(1) of the Act, 21 U.S.C. section 360bbb-3(b)(1), unless the authorization is terminated or revoked sooner. Performed at [facility], [place] [address] [place], [place] [address]   Urine Culture     Status: None   Collection Time: 07/02/19  6:52 PM   Specimen: Urine, Random  Result Value Ref Range Status   Specimen Description   Final    URINE, RANDOM Performed at [facility], [address] [place], [place] [address]    Special Requests   Final    NONE Performed at [facility], [address], [place], [place] [address]    Culture   Final    NO GROWTH Performed at [facility], [place] [address], [place], [place] [address]    Report Status 07/04/2019 FINAL  Final      Radiology Studies: No results found.     LOS: 4 days   Annasophia Crocker [employer] on [email or web address]  07/08/2019, 11:52 AM

## 2019-07-09 ENCOUNTER — Inpatient Hospital Stay (HOSPITAL_COMMUNITY): Payer: Medicare HMO

## 2019-07-09 DIAGNOSIS — R791 Abnormal coagulation profile: Secondary | ICD-10-CM

## 2019-07-09 DIAGNOSIS — U071 COVID-19: Secondary | ICD-10-CM

## 2019-07-09 LAB — CBC WITH DIFFERENTIAL/PLATELET
Abs Immature Granulocytes: 2 10*3/uL — ABNORMAL HIGH (ref 0.00–0.07)
Band Neutrophils: 7 %
Basophils Absolute: 0 10*3/uL (ref 0.0–0.1)
Basophils Relative: 0 %
Eosinophils Absolute: 0 10*3/uL (ref 0.0–0.5)
Eosinophils Relative: 0 %
HCT: 45 % (ref 39.0–52.0)
Hemoglobin: 14.2 g/dL (ref 13.0–17.0)
Lymphocytes Relative: 12 %
Lymphs Abs: 2.2 10*3/uL (ref 0.7–4.0)
MCH: 28.6 pg (ref 26.0–34.0)
MCHC: 31.6 g/dL (ref 30.0–36.0)
MCV: 90.7 fL (ref 80.0–100.0)
Metamyelocytes Relative: 3 %
Monocytes Absolute: 0.9 10*3/uL (ref 0.1–1.0)
Monocytes Relative: 5 %
Myelocytes: 8 %
Neutro Abs: 13.2 10*3/uL — ABNORMAL HIGH (ref 1.7–7.7)
Neutrophils Relative %: 65 %
Platelets: 110 10*3/uL — ABNORMAL LOW (ref 150–400)
RBC: 4.96 MIL/uL (ref 4.22–5.81)
RDW: 15.1 % (ref 11.5–15.5)
WBC: 18.4 10*3/uL — ABNORMAL HIGH (ref 4.0–10.5)
nRBC: 0.3 % — ABNORMAL HIGH (ref 0.0–0.2)

## 2019-07-09 LAB — COMPREHENSIVE METABOLIC PANEL
ALT: 51 U/L — ABNORMAL HIGH (ref 0–44)
AST: 35 U/L (ref 15–41)
Albumin: 3.2 g/dL — ABNORMAL LOW (ref 3.5–5.0)
Alkaline Phosphatase: 48 U/L (ref 38–126)
Anion gap: 9 (ref 5–15)
BUN: 22 mg/dL (ref 8–23)
CO2: 28 mmol/L (ref 22–32)
Calcium: 8.7 mg/dL — ABNORMAL LOW (ref 8.9–10.3)
Chloride: 104 mmol/L (ref 98–111)
Creatinine, Ser: 0.99 mg/dL (ref 0.61–1.24)
GFR calc Af Amer: >60 mL/min (ref >60)
GFR calc non Af Amer: >60 mL/min (ref >60)
Glucose, Bld: 80 mg/dL (ref 70–99)
Potassium: 3.8 mmol/L (ref 3.5–5.1)
Sodium: 141 mmol/L (ref 135–145)
Total Bilirubin: 1.4 mg/dL — ABNORMAL HIGH (ref 0.3–1.2)
Total Protein: 6.1 g/dL — ABNORMAL LOW (ref 6.5–8.1)

## 2019-07-09 LAB — MAGNESIUM: Magnesium: 2.2 mg/dL (ref 1.7–2.4)

## 2019-07-09 LAB — C-REACTIVE PROTEIN: CRP: 0.9 mg/dL (ref <1.0)

## 2019-07-09 LAB — D-DIMER, QUANTITATIVE: D-Dimer, Quant: >20 ug/mL-FEU — ABNORMAL HIGH (ref 0.00–0.50)

## 2019-07-09 LAB — PROTIME-INR
INR: 2.6 — ABNORMAL HIGH (ref 0.8–1.2)
Prothrombin Time: 27.6 seconds — ABNORMAL HIGH (ref 11.4–15.2)

## 2019-07-09 MED ORDER — ENOXAPARIN SODIUM 120 MG/0.8ML ~~LOC~~ SOLN
120.0000 mg | Freq: Two times a day (BID) | SUBCUTANEOUS | Status: DC
  Administered 2019-07-09 – 2019-07-11 (×4): 120 mg via SUBCUTANEOUS
  Filled 2019-07-09 (×6): qty 0.8

## 2019-07-09 MED ORDER — DIGOXIN 250 MCG PO TABS
0.2500 mg | ORAL_TABLET | Freq: Four times a day (QID) | ORAL | Status: AC
  Administered 2019-07-09 – 2019-07-10 (×2): 0.25 mg via ORAL
  Filled 2019-07-09 (×2): qty 1

## 2019-07-09 MED ORDER — WARFARIN SODIUM 7.5 MG PO TABS
7.5000 mg | ORAL_TABLET | Freq: Once | ORAL | Status: DC
  Filled 2019-07-09: qty 1

## 2019-07-09 NOTE — Progress Notes (Addendum)
VASCULAR LAB PRELIMINARY  PRELIMINARY  PRELIMINARY  PRELIMINARY  Bilateral lower extremity venous duplex completed.    Preliminary report:  See CV proc for preliminary results.  Messaged Dr. [place] Pink with results.   Nyna Chilton, RVT 07/09/2019, 2:55 PM

## 2019-07-09 NOTE — Plan of Care (Signed)
  Problem: Education: Goal: Knowledge of risk factors and measures for prevention of condition will improve 07/09/2019 0140 by Heloise Purpura, RN Outcome: Progressing 07/09/2019 0139 by Heloise Purpura, RN Outcome: Progressing   Problem: Coping: Goal: Psychosocial and spiritual needs will be supported 07/09/2019 0140 by Heloise Purpura, RN Outcome: Progressing 07/09/2019 0139 by Heloise Purpura, RN Outcome: Progressing   Problem: Respiratory: Goal: Will maintain a patent airway 07/09/2019 0140 by Heloise Purpura, RN Outcome: Progressing 07/09/2019 0139 by Heloise Purpura, RN Outcome: Progressing Goal: Complications related to the disease process, condition or treatment will be avoided or minimized 07/09/2019 0140 by Heloise Purpura, RN Outcome: Progressing 07/09/2019 0139 by Heloise Purpura, RN Outcome: Progressing   Problem: Education: Goal: Knowledge of risk factors and measures for prevention of condition will improve Outcome: Progressing   Problem: Coping: Goal: Psychosocial and spiritual needs will be supported Outcome: Progressing   Problem: Respiratory: Goal: Will maintain a patent airway Outcome: Progressing Goal: Complications related to the disease process, condition or treatment will be avoided or minimized Outcome: Progressing

## 2019-07-09 NOTE — Progress Notes (Addendum)
Wall for warfarin Indication: atrial fibrillation & h/o DVT  Allergies  Allergen Reactions  . Plasma, Human Shortness Of Breath, Anxiety, Other (See Comments), Cough and Hypertension    Redness    Patient Measurements: Height: 5\' 9"  (175.3 cm) Weight: 273 lb 5.9 oz (124 kg) IBW/kg (Calculated) : 70.7  Vital Signs: Temp: 97.6 F (36.4 C) (12/29 0736) Temp Source: Oral (12/29 0736) BP: 113/93 (12/29 0736) Pulse Rate: 132 (12/29 0736)  Labs: Recent Labs    07/07/19 0225 07/08/19 0437 07/09/19 0414  HGB 12.8* 13.6 14.2  HCT 40.2 43.1 45.0  PLT 122* 114* 110*  LABPROT 33.0* 32.0* 27.6*  INR 3.2* 3.1* 2.6*  CREATININE 0.90 0.87 0.99   Estimated Creatinine Clearance: 92.9 mL/min (by C-G formula based on SCr of 0.99 mg/dL).  Medical History: Past Medical History:  Diagnosis Date  . Atrial fibrillation ([place])   . DVT (deep venous thrombosis) ([facility])    Medications:  Scheduled:  . vitamin C  500 mg Oral Daily  . docusate sodium  100 mg Oral BID  . famotidine  20 mg Oral Daily  . Ipratropium-Albuterol  4 puff Inhalation QID  . methylPREDNISolone (SOLU-MEDROL) injection  40 mg Intravenous Q24H  . metoprolol tartrate  25 mg Oral BID  . multivitamin with minerals  1 tablet Oral Daily  . Warfarin - Pharmacist Dosing Inpatient   Does not apply q1800  . zinc sulfate  220 mg Oral Daily   Assessment: Patient presented to ED with near syncope.  Pt has a h/o afib, DVT on warfarin.    Patient takes warfarin 7.5 mg daily PTA.  INR 2.6 today - [place] held 12/27  Goal of Therapy:  INR 2-3 Monitor platelets by anticoagulation protocol: Yes   Plan:  Resume home dose warfarin 7.5mg  PO x 1 tonight. May need to reduce dose based on PO intake / acute illness.  Will monitor. Adjust dose per INR trends and monitor daily CBCs.  [employer], Pharm.D., BCPS Clinical Pharmacist Clinical phone for 07/09/2019 from 8:30-4:00 is  [phone].  **Pharmacist phone directory can be found on [email or web address] listed under [place].  07/09/2019 10:06 AM   _______________________________  ADDENDUM: D-dimer elevated >20.  Dopplers reveal acute DVT in the setting of COVID and therapeutic INR.  MD has asked pharmacy to start treatment dose Lovenox and hold warfarin.  Will need further discussion regarding oral agent for VTE at discharge.  D/C Warfarin. Start Lovenox 120mg  SQ q12h  [employer], Pharm.D., BCPS Clinical Pharmacist Clinical phone for 07/09/2019 from 8:30-4:00 is [phone].  **Pharmacist phone directory can be found on [email or web address] listed under [place].  07/09/2019 2:29 PM

## 2019-07-09 NOTE — Plan of Care (Signed)

## 2019-07-09 NOTE — Progress Notes (Signed)
PROGRESS NOTE  Neil Bailey [id number] DOB: Feb 13, 1951 DOA: 07/04/2019  PCP: [name], MD  Brief History/Interval Summary: 68 year old Caucasian male with a past medical history of atrial fibrillation on warfarin, history of DVT, former smoker, who initially presented with presyncope.  He was also noted to have a new cough.  Was noted to be mildly confused according to his spouse.  In the emergency department he was noted to be febrile.  He tested positive for COVID-19.  Chest x-ray showed pneumonia.  CT head did not show any acute findings.  EKG showed atrial fibrillation.  Initially he was requiring 2 L of oxygen.  He was hospitalized at [facility].  He was started on steroids and remdesivir.  His oxygen requirement slowly started climbing.  Subsequently he was transferred here to [facility].  Reason for Visit: Acute respiratory failure with hypoxia.  Pneumonia due to COVID-19.  Consultants: None  Procedures: None  Antibiotics: Anti-infectives (From admission, onward)   Start     Dose/Rate Route Frequency Ordered Stop   07/05/19 1000  remdesivir 100 mg in sodium chloride 0.9 % 100 mL IVPB     100 mg 200 mL/hr over 30 Minutes Intravenous Daily 07/04/19 1806 07/05/19 1900   07/05/19 0200  azithromycin (ZITHROMAX) 500 mg in sodium chloride 0.9 % 250 mL IVPB     500 mg 250 mL/hr over 60 Minutes Intravenous Every 24 hours 07/04/19 1723 07/07/19 0354   07/04/19 1730  cefTAZidime (FORTAZ) 2 g in sodium chloride 0.9 % 100 mL IVPB     2 g 200 mL/hr over 30 Minutes Intravenous Every 8 hours 07/04/19 1723 07/09/19 0646      Subjective/Interval History: Patient remains mildly distracted. Denies any complaints this morning. Wants to go home. However still having some difficulty breathing. He did walk in the hallway yesterday per nursing staff. Heart rate noted to be elevated this morning.    Assessment/Plan:  Acute Hypoxic Resp. Failure/Pneumonia due to  COVID-19   Recent Labs  Lab 07/03/19 0700 07/05/19 0517 07/06/19 0443 07/07/19 0225 07/07/19 0255 07/08/19 0437 07/09/19 0414  DDIMER  --  3.26* 3.22* 4.25*  --  >20.00* >20.00*  CRP  --  6.5* 3.3*  --  1.6* 1.1* 0.9  ALT  --  22 24 27   --  61* 51*  PROCALCITON 0.52 0.68 0.28  --   --   --   --     Objective findings: Fever: Remains afebrile Oxygen requirements: Nasal cannula. 2 L/min. Saturating in the early 90s.   COVID 19 Therapeutics: Antibacterials: We will complete 7-day course of Fortaz and 5-day course of azithromycin. Remdesivir: Completed course on 12/25. Steroids: Solu-Medrol now 40 mg once a day. Diuretics: He was given a dose of Lasix on 12/25.   Actemra: Could not be given due to elevated procalcitonin Convalescent Plasma: Unfortunately had transfusion reaction to plasma on 12/25 Vitamin C and Zinc: Continue PUD Prophylaxis: Pepcid DVT Prophylaxis: Patient is on warfarin  Patient seems to be gradually improving from a respiratory standpoint. He is down to 2 to 3 L of oxygen via nasal cannula. His inflammatory markers have improved significantly. His CRP is down to 0.9. Continue to mobilize. Steroids being tapered down. Patient has completed course of remdesivir. He had a transfusion reaction to plasma due to incorrect blood type.  D-dimer remains greater than 20. Lower extremity Doppler study is pending. He is anticoagulated with warfarin and has had therapeutic INR. Respiratory status has been improving.  Hold off on CT angiogram.  He will complete 7-day course of Fortaz and 5-day course of azithromycin. Continue with incentive spirometry and mobilization. Out of bed to chair. Leukocytosis is due to steroids.  History of chronic atrial fibrillation Patient was not on any rate limiting drugs at home. Here he is noted to have tachycardia mainly in the morning hours. Continue with metoprolol. Continue warfarin.   History of DVT Continue warfarin.  Acute  metabolic encephalopathy CT head did not show any acute findings.  Encephalopathy most likely due to acute illness from COVID-19. Seems to be close to his baseline. Still occasionally distracted.  Thrombocytopenia  Platelet counts were low most likely due to acute illness. Improved.  Hypokalemia Repleted. Magnesium is normal.  Obesity Estimated body mass index is 40.37 kg/m as calculated from the following:   Height as of this encounter: 5\' 9"  (1.753 m).   Weight as of this encounter: 124 kg.   DVT Prophylaxis: Warfarin Code Status: Full code Family Communication: Wife being updated on a daily basis. Disposition Plan: Mobilize.  Ambulate in the hallway.   Medications:  Scheduled: . vitamin C  500 mg Oral Daily  . docusate sodium  100 mg Oral BID  . famotidine  20 mg Oral Daily  . Ipratropium-Albuterol  4 puff Inhalation QID  . methylPREDNISolone (SOLU-MEDROL) injection  40 mg Intravenous Q24H  . metoprolol tartrate  25 mg Oral BID  . multivitamin with minerals  1 tablet Oral Daily  . warfarin  7.5 mg Oral ONCE-1800  . Warfarin - Pharmacist Dosing Inpatient   Does not apply q1800  . zinc sulfate  220 mg Oral Daily   Continuous:  [id number], albuterol, benzonatate, diphenhydrAMINE, metoprolol tartrate, ondansetron **OR** ondansetron (ZOFRAN) IV   Objective:  Vital Signs  Vitals:   07/08/19 2000 07/08/19 2100 07/09/19 0736 07/09/19 0818  BP:  118/71 (!) 113/93   Pulse:   (!) 132   Resp:   18   Temp: 98.4 F (36.9 C)  97.6 F (36.4 C)   TempSrc:   Oral   SpO2:   95% 100%  Weight:      Height:        Intake/Output Summary (Last 24 hours) at 07/09/2019 1116 Last data filed at 07/09/2019 0821 Gross per 24 hour  Intake --  Output 1200 ml  Net -1200 ml   Filed Weights   07/04/19 1945  Weight: 124 kg    General appearance: Awake alert.  In no distress Resp: Improved air entry bilaterally. Crackles at the bases bilaterally. Normal effort at rest. No  wheezing or rhonchi. Cardio: S1-S2 is irregularly irregular. No S3-S4.  No rubs murmurs or bruit GI: Abdomen is soft.  Nontender nondistended.  Bowel sounds are present normal.  No masses organomegaly Extremities: Bilateral lower extremity edema noted Neurologic: Alert and oriented x3.  No focal neurological deficits.  .    Lab Results:  Data Reviewed: I have personally reviewed following labs and imaging studies  CBC: Recent Labs  Lab 07/05/19 0517 07/06/19 0443 07/07/19 0225 07/08/19 0437 07/09/19 0414  WBC 4.7 4.9 8.7 15.8* 18.4*  NEUTROABS 3.5 3.9 7.1 12.3* 13.2*  HGB 15.8 12.6* 12.8* 13.6 14.2  HCT 49.7 38.8* 40.2 43.1 45.0  MCV 89.7 88.0 89.1 89.8 90.7  PLT 75* 80* 122* 114* 110*    Basic Metabolic Panel: Recent Labs  Lab 07/05/19 0517 07/06/19 0443 07/07/19 0225 07/08/19 0437 07/09/19 0414  NA 138 138 140 141 141  K 3.4*  3.9 3.9 4.2 3.8  CL 100 103 104 104 104  CO2 23 26 25 27 28   GLUCOSE 162* 140* 141* 114* 80  BUN 21 22 22  24* 22  CREATININE 1.09 0.92 0.90 0.87 0.99  CALCIUM 7.9* 8.2* 8.4* 8.7* 8.7*  MG 2.1 2.3 2.4 2.4 2.2    GFR: Estimated Creatinine Clearance: 92.9 mL/min (by C-G formula based on SCr of 0.99 mg/dL).  Liver Function Tests: Recent Labs  Lab 07/05/19 0517 07/06/19 0443 07/07/19 0225 07/08/19 0437 07/09/19 0414  AST 44* 37 35 54* 35  ALT 22 24 27  61* 51*  ALKPHOS 38 35* 36* 47 48  BILITOT 1.1 0.7 0.5 1.2 1.4*  PROT 6.7 5.8* 5.9* 6.4* 6.1*  ALBUMIN 3.2* 2.9* 2.9* 3.4* 3.2*     Coagulation Profile: Recent Labs  Lab 07/05/19 1404 07/06/19 0443 07/07/19 0225 07/08/19 0437 07/09/19 0414  INR 2.5* 2.9* 3.2* 3.1* 2.6*    CBG: No results for input(s): GLUCAP in the last 168 hours.    Recent Results (from the past 240 hour(s))  CULTURE, BLOOD (ROUTINE X 2) w Reflex to ID Panel     Status: None   Collection Time: 06/30/19  9:41 PM   Specimen: BLOOD  Result Value Ref Range Status   Specimen Description BLOOD RIGHT  ANTECUBITAL  Final   Special Requests   Final    BOTTLES DRAWN AEROBIC AND ANAEROBIC Blood Culture results may not be optimal due to an excessive volume of blood received in culture bottles   Culture   Final    NO GROWTH 5 DAYS Performed at [facility], [address] [place], [place] [address]    Report Status 07/05/2019 FINAL  Final  CULTURE, BLOOD (ROUTINE X 2) w Reflex to ID Panel     Status: None   Collection Time: 06/30/19  9:41 PM   Specimen: BLOOD  Result Value Ref Range Status   Specimen Description BLOOD LEFT ANTECUBITAL  Final   Special Requests   Final    BOTTLES DRAWN AEROBIC AND ANAEROBIC Blood Culture adequate volume   Culture   Final    NO GROWTH 5 DAYS Performed at [facility], [address], [place], [place] [address]    Report Status 07/05/2019 FINAL  Final  SARS CORONAVIRUS 2 (TAT 6-24 HRS) Nasopharyngeal Nasopharyngeal Swab     Status: Abnormal   Collection Time: 06/30/19  9:55 PM   Specimen: Nasopharyngeal Swab  Result Value Ref Range Status   SARS Coronavirus 2 POSITIVE (A) NEGATIVE Final    Comment: RESULT CALLED TO, READ BACK BY AND VERIFIED WITH: Pryor Montes RN 10:00 07/01/19 ([email or web address]) (NOTE) SARS-CoV-2 target nucleic acids are DETECTED. The SARS-CoV-2 RNA is generally detectable in upper and lower respiratory specimens during the acute phase of infection. Positive results are indicative of the presence of SARS-CoV-2 RNA. Clinical correlation with patient history and other diagnostic information is  necessary to determine patient infection status. Positive results do not rule out bacterial infection or co-infection with other viruses.  The expected result is Negative. Fact Sheet for Patients: SugarRoll.be Fact Sheet for Healthcare Providers: https://www.woods-mathews.com/ This test is not yet approved or cleared by the [place] FDA and  has been authorized for detection and/or  diagnosis of SARS-CoV-2 by FDA under an Emergency Use Authorization (EUA). This EUA will remain  in effect (meaning this test can be used) for  the duration of the COVID-19 declaration under Section 564(b)(1) of the Act, 21 U.S.C. section 360bbb-3(b)(1),  unless the authorization is terminated or revoked sooner. Performed at [facility], [place] [address], [place], [place] [address]   Urine Culture     Status: None   Collection Time: 07/02/19  6:52 PM   Specimen: Urine, Random  Result Value Ref Range Status   Specimen Description   Final    URINE, RANDOM Performed at [facility], [address], [place], [place] [address]    Special Requests   Final    NONE Performed at [facility], [address] [place], [place] [address]    Culture   Final    NO GROWTH Performed at [facility], [place] [address] [place], [place] [address]    Report Status 07/04/2019 FINAL  Final      Radiology Studies: No results found.     LOS: 5 days   Aylee Littrell [employer] on [email or web address]  07/09/2019, 11:16 AM

## 2019-07-10 DIAGNOSIS — I824Y3 Acute embolism and thrombosis of unspecified deep veins of proximal lower extremity, bilateral: Secondary | ICD-10-CM

## 2019-07-10 DIAGNOSIS — I4891 Unspecified atrial fibrillation: Secondary | ICD-10-CM

## 2019-07-10 LAB — BASIC METABOLIC PANEL
Anion gap: 9 (ref 5–15)
BUN: 18 mg/dL (ref 8–23)
CO2: 26 mmol/L (ref 22–32)
Calcium: 8.2 mg/dL — ABNORMAL LOW (ref 8.9–10.3)
Chloride: 106 mmol/L (ref 98–111)
Creatinine, Ser: 0.93 mg/dL (ref 0.61–1.24)
GFR calc Af Amer: >60 mL/min (ref >60)
GFR calc non Af Amer: >60 mL/min (ref >60)
Glucose, Bld: 85 mg/dL (ref 70–99)
Potassium: 3.7 mmol/L (ref 3.5–5.1)
Sodium: 141 mmol/L (ref 135–145)

## 2019-07-10 LAB — CBC
HCT: 42.5 % (ref 39.0–52.0)
Hemoglobin: 13.4 g/dL (ref 13.0–17.0)
MCH: 28.6 pg (ref 26.0–34.0)
MCHC: 31.5 g/dL (ref 30.0–36.0)
MCV: 90.6 fL (ref 80.0–100.0)
Platelets: 108 10*3/uL — ABNORMAL LOW (ref 150–400)
RBC: 4.69 MIL/uL (ref 4.22–5.81)
RDW: 15 % (ref 11.5–15.5)
WBC: 15.5 10*3/uL — ABNORMAL HIGH (ref 4.0–10.5)
nRBC: 0.3 % — ABNORMAL HIGH (ref 0.0–0.2)

## 2019-07-10 LAB — D-DIMER, QUANTITATIVE: D-Dimer, Quant: 11.04 ug/mL-FEU — ABNORMAL HIGH (ref 0.00–0.50)

## 2019-07-10 LAB — PROTIME-INR
INR: 2.1 — ABNORMAL HIGH (ref 0.8–1.2)
Prothrombin Time: 23.4 seconds — ABNORMAL HIGH (ref 11.4–15.2)

## 2019-07-10 MED ORDER — DIGOXIN 250 MCG PO TABS
0.2500 mg | ORAL_TABLET | Freq: Once | ORAL | Status: AC
  Administered 2019-07-10: 10:00:00 0.25 mg via ORAL
  Filled 2019-07-10: qty 1

## 2019-07-10 MED ORDER — POTASSIUM CHLORIDE CRYS ER 20 MEQ PO TBCR
40.0000 meq | EXTENDED_RELEASE_TABLET | Freq: Once | ORAL | Status: AC
  Administered 2019-07-10: 12:00:00 40 meq via ORAL
  Filled 2019-07-10: qty 2

## 2019-07-10 MED ORDER — PREDNISONE 20 MG PO TABS
40.0000 mg | ORAL_TABLET | Freq: Every day | ORAL | Status: DC
  Administered 2019-07-11: 40 mg via ORAL
  Filled 2019-07-10: qty 2

## 2019-07-10 NOTE — Progress Notes (Addendum)
PROGRESS NOTE  Neil Bailey [id number] DOB: 06-29-1951 DOA: 07/04/2019  PCP: [name], MD  Brief History/Interval Summary: 68 year old Caucasian male with a past medical history of atrial fibrillation on warfarin, history of DVT, former smoker, who initially presented with presyncope.  He was also noted to have a new cough.  Was noted to be mildly confused according to his spouse.  In the emergency department he was noted to be febrile.  He tested positive for COVID-19.  Chest x-ray showed pneumonia.  CT head did not show any acute findings.  EKG showed atrial fibrillation.  Initially he was requiring 2 L of oxygen.  He was hospitalized at [facility].  He was started on steroids and remdesivir.  His oxygen requirement slowly started climbing.  Subsequently he was transferred here to [facility].  Reason for Visit: Acute respiratory failure with hypoxia.  Pneumonia due to COVID-19.  Consultants: None  Procedures: None  Antibiotics: Anti-infectives (From admission, onward)   Start     Dose/Rate Route Frequency Ordered Stop   07/05/19 1000  remdesivir 100 mg in sodium chloride 0.9 % 100 mL IVPB     100 mg 200 mL/hr over 30 Minutes Intravenous Daily 07/04/19 1806 07/05/19 1900   07/05/19 0200  azithromycin (ZITHROMAX) 500 mg in sodium chloride 0.9 % 250 mL IVPB     500 mg 250 mL/hr over 60 Minutes Intravenous Every 24 hours 07/04/19 1723 07/07/19 0354   07/04/19 1730  cefTAZidime (FORTAZ) 2 g in sodium chloride 0.9 % 100 mL IVPB     2 g 200 mL/hr over 30 Minutes Intravenous Every 8 hours 07/04/19 1723 07/09/19 0646      Subjective/Interval History: Patient not as upset this morning as he was yesterday.  He is disappointed that he will be unable to go home today.  However he does understand.  He states that he slept well overnight.  He denies any chest pain shortness of breath palpitations dizziness or lightheadedness.  Heart rate has been elevated for most of the  night.    Assessment/Plan:  Acute Hypoxic Resp. Failure/Pneumonia due to COVID-19   Recent Labs  Lab 07/05/19 0517 07/06/19 0443 07/07/19 0225 07/07/19 0255 07/08/19 0437 07/09/19 0414 07/10/19 0311  DDIMER 3.26* 3.22* 4.25*  --  >20.00* >20.00* 11.04*  CRP 6.5* 3.3*  --  1.6* 1.1* 0.9  --   ALT 22 24 27   --  61* 51*  --   PROCALCITON 0.68 0.28  --   --   --   --   --     Objective findings: Fever: Remains afebrile Oxygen requirements: Nasal cannula 2 to 4 L.  Saturating in the early to mid 66s.    COVID 19 Therapeutics: Antibacterials: Completed course of Fortaz and azithromycin. Remdesivir: Completed course on 12/25. Steroids: Solu-Medrol 40 mg once a day.  Change to oral steroids. Diuretics: He was given a dose of Lasix on 12/25.   Actemra: Could not be given due to elevated procalcitonin Convalescent Plasma: Unfortunately had transfusion reaction to plasma on 12/25 Vitamin C and Zinc: Continue PUD Prophylaxis: Pepcid DVT Prophylaxis: Changed over from warfarin to Lovenox due to new DVT  Patient seems to be stable from a respiratory standpoint.  He is still requiring 2 to 4 L of oxygen.  His inflammatory markers have improved.  He has completed course of remdesivir.  Taper steroids.  He also completed course of antibacterials.  Continue incentive spirometry mobilization as much as tolerated.  Leukocytosis  is due to steroids and seems to be improving.  Acute lower extremity DVT with previous history of DVT Patient developed acute DVT despite being on warfarin with therapeutic INR.  This is likely due to hypercoagulable state from COVID-19.  Could also be considered failure of warfarin.  He was switched over to Lovenox which will be continued till he is a bit more stable.  Then we can change him to one of the direct acting agents.  D-dimer is noted to be better today at 11.0 from a peak of greater than 20.  Chronic atrial fibrillation now with RVR Patient's heart rate  has been poorly controlled over the last 48 hours.  He was not requiring any rate limiting drugs at home.  He was started on metoprolol here.  His blood pressure has been borderline low.  This limits our treatment options.  He was started on digoxin yesterday.  Heart rate is poorly controlled this morning.  He will be given additional dose of digoxin and will be continued on his metoprolol.  Continue to monitor on telemetry.  He remains asymptomatic.  Check thyroid function test tomorrow.  No echocardiogram in our system.  This can be pursued in the outpatient setting if not done recently.  Stop the inhalers as they could be causing some of his tachycardia.  Acute metabolic encephalopathy CT head did not show any acute findings.  Encephalopathy most likely due to acute illness from COVID-19. Seems to be close to his baseline. Still occasionally distracted.  Thrombocytopenia  Platelet counts were low most likely due to acute illness. Improved.  Hypokalemia Magnesium has been normal.  Potassium 3.7 today.  Will supplement as he is now on digoxin.  Obesity Estimated body mass index is 40.37 kg/m as calculated from the following:   Height as of this encounter: 5\' 9"  (1.753 m).   Weight as of this encounter: 124 kg.   DVT Prophylaxis: Lovenox therapeutic dose Code Status: Full code Family Communication: Wife being updated on a daily basis. Disposition Plan: Wait for improvement in heart rate.   Medications:  Scheduled: . vitamin C  500 mg Oral Daily  . docusate sodium  100 mg Oral BID  . enoxaparin (LOVENOX) injection  120 mg Subcutaneous BID  . famotidine  20 mg Oral Daily  . Ipratropium-Albuterol  4 puff Inhalation QID  . methylPREDNISolone (SOLU-MEDROL) injection  40 mg Intravenous Q24H  . metoprolol tartrate  25 mg Oral BID  . multivitamin with minerals  1 tablet Oral Daily  . zinc sulfate  220 mg Oral Daily   Continuous:  [id number], albuterol, benzonatate,  diphenhydrAMINE, metoprolol tartrate, ondansetron **OR** ondansetron (ZOFRAN) IV   Objective:  Vital Signs  Vitals:   07/10/19 0530 07/10/19 0545 07/10/19 0600 07/10/19 0729  BP:    109/76  Pulse: 100 (!) 47 96 60  Resp: (!) 21 19 (!) 21 (!) 28  Temp:      TempSrc:      SpO2: 96% 93% 97% 95%  Weight:      Height:        Intake/Output Summary (Last 24 hours) at 07/10/2019 1030 Last data filed at 07/10/2019 1000 Gross per 24 hour  Intake --  Output 880 ml  Net -880 ml   Filed Weights   07/04/19 1945  Weight: 124 kg    General appearance: Awake alert.  In no distress.  Slightly anxious Resp: Normal effort at rest.  Few crackles at the bases.  No wheezing or rhonchi  Cardio: S1-S2 is irregularly irregular, tachycardic.  No S3-S4.  No rubs or bruit.   GI: Abdomen is soft.  Nontender nondistended.  Bowel sounds are present normal.  No masses organomegaly Extremities: Bilateral lower extremity edema Neurologic: Alert and oriented x3.  No focal neurological deficits.       Lab Results:  Data Reviewed: I have personally reviewed following labs and imaging studies  CBC: Recent Labs  Lab 07/05/19 0517 07/06/19 0443 07/07/19 0225 07/08/19 0437 07/09/19 0414 07/10/19 0311  WBC 4.7 4.9 8.7 15.8* 18.4* 15.5*  NEUTROABS 3.5 3.9 7.1 12.3* 13.2*  --   HGB 15.8 12.6* 12.8* 13.6 14.2 13.4  HCT 49.7 38.8* 40.2 43.1 45.0 42.5  MCV 89.7 88.0 89.1 89.8 90.7 90.6  PLT 75* 80* 122* 114* 110* 108*    Basic Metabolic Panel: Recent Labs  Lab 07/05/19 0517 07/06/19 0443 07/07/19 0225 07/08/19 0437 07/09/19 0414 07/10/19 0311  NA 138 138 140 141 141 141  K 3.4* 3.9 3.9 4.2 3.8 3.7  CL 100 103 104 104 104 106  CO2 23 26 25 27 28 26   GLUCOSE 162* 140* 141* 114* 80 85  BUN 21 22 22  24* 22 18  CREATININE 1.09 0.92 0.90 0.87 0.99 0.93  CALCIUM 7.9* 8.2* 8.4* 8.7* 8.7* 8.2*  MG 2.1 2.3 2.4 2.4 2.2  --     GFR: Estimated Creatinine Clearance: 98.9 mL/min (by C-G formula  based on SCr of 0.93 mg/dL).  Liver Function Tests: Recent Labs  Lab 07/05/19 0517 07/06/19 0443 07/07/19 0225 07/08/19 0437 07/09/19 0414  AST 44* 37 35 54* 35  ALT 22 24 27  61* 51*  ALKPHOS 38 35* 36* 47 48  BILITOT 1.1 0.7 0.5 1.2 1.4*  PROT 6.7 5.8* 5.9* 6.4* 6.1*  ALBUMIN 3.2* 2.9* 2.9* 3.4* 3.2*     Coagulation Profile: Recent Labs  Lab 07/06/19 0443 07/07/19 0225 07/08/19 0437 07/09/19 0414 07/10/19 0311  INR 2.9* 3.2* 3.1* 2.6* 2.1*    CBG: No results for input(s): GLUCAP in the last 168 hours.    Recent Results (from the past 240 hour(s))  CULTURE, BLOOD (ROUTINE X 2) w Reflex to ID Panel     Status: None   Collection Time: 06/30/19  9:41 PM   Specimen: BLOOD  Result Value Ref Range Status   Specimen Description BLOOD RIGHT ANTECUBITAL  Final   Special Requests   Final    BOTTLES DRAWN AEROBIC AND ANAEROBIC Blood Culture results may not be optimal due to an excessive volume of blood received in culture bottles   Culture   Final    NO GROWTH 5 DAYS Performed at [facility], [address] [place], [place] [address]    Report Status 07/05/2019 FINAL  Final  CULTURE, BLOOD (ROUTINE X 2) w Reflex to ID Panel     Status: None   Collection Time: 06/30/19  9:41 PM   Specimen: BLOOD  Result Value Ref Range Status   Specimen Description BLOOD LEFT ANTECUBITAL  Final   Special Requests   Final    BOTTLES DRAWN AEROBIC AND ANAEROBIC Blood Culture adequate volume   Culture   Final    NO GROWTH 5 DAYS Performed at [facility], [address] [place], [place] [address]    Report Status 07/05/2019 FINAL  Final  SARS CORONAVIRUS 2 (TAT 6-24 HRS) Nasopharyngeal Nasopharyngeal Swab     Status: Abnormal   Collection Time: 06/30/19  9:55 PM   Specimen: Nasopharyngeal Swab  Result Value Ref Range Status   SARS Coronavirus 2 POSITIVE (A) NEGATIVE Final    Comment: RESULT CALLED TO, READ BACK BY AND VERIFIED WITH: Pryor Montes RN 10:00  07/01/19 ([email or web address]) (NOTE) SARS-CoV-2 target nucleic acids are DETECTED. The SARS-CoV-2 RNA is generally detectable in upper and lower respiratory specimens during the acute phase of infection. Positive results are indicative of the presence of SARS-CoV-2 RNA. Clinical correlation with patient history and other diagnostic information is  necessary to determine patient infection status. Positive results do not rule out bacterial infection or co-infection with other viruses.  The expected result is Negative. Fact Sheet for Patients: SugarRoll.be Fact Sheet for Healthcare Providers: https://www.woods-mathews.com/ This test is not yet approved or cleared by the [place] FDA and  has been authorized for detection and/or diagnosis of SARS-CoV-2 by FDA under an Emergency Use Authorization (EUA). This EUA will remain  in effect (meaning this test can be used) for  the duration of the COVID-19 declaration under Section 564(b)(1) of the Act, 21 U.S.C. section 360bbb-3(b)(1), unless the authorization is terminated or revoked sooner. Performed at [facility], [place] [address] [place], [place] [address]   Urine Culture     Status: None   Collection Time: 07/02/19  6:52 PM   Specimen: Urine, Random  Result Value Ref Range Status   Specimen Description   Final    URINE, RANDOM Performed at [facility], [address], [place], [place] [address]    Special Requests   Final    NONE Performed at [facility], [address] [place], [place] [address]    Culture   Final    NO GROWTH Performed at [facility], [place] [address], [place], [place] [address]    Report Status 07/04/2019 FINAL  Final      Radiology Studies: LE Venous  Result Date: 07/13/2019  Lower Venous Study Indications: Covid-19, elevatd D-Dimer.  Comparison Study: No prior study on file for comparison Performing Technologist: Sharion Dove RVS   Examination Guidelines: A complete evaluation includes B-mode imaging, spectral Doppler, color Doppler, and power Doppler as needed of all accessible portions of each vessel. Bilateral testing is considered an integral part of a complete examination. Limited examinations for reoccurring indications may be performed as noted.  +---------+---------------+---------+-----------+----------+--------------+ RIGHT    CompressibilityPhasicitySpontaneityPropertiesThrombus Aging +---------+---------------+---------+-----------+----------+--------------+ CFV      Full           Yes      Yes                                 +---------+---------------+---------+-----------+----------+--------------+ SFJ      Full                                                        +---------+---------------+---------+-----------+----------+--------------+ FV Prox  Full                                                        +---------+---------------+---------+-----------+----------+--------------+ FV Mid   Full                                                        +---------+---------------+---------+-----------+----------+--------------+  FV DistalFull                                                        +---------+---------------+---------+-----------+----------+--------------+ PFV      Full                                                        +---------+---------------+---------+-----------+----------+--------------+ POP      Partial        No       No                   Acute          +---------+---------------+---------+-----------+----------+--------------+ PTV      None                                         Acute          +---------+---------------+---------+-----------+----------+--------------+ PERO     None                                         Acute          +---------+---------------+---------+-----------+----------+--------------+    +---------+---------------+---------+-----------+----------+--------------+ LEFT     CompressibilityPhasicitySpontaneityPropertiesThrombus Aging +---------+---------------+---------+-----------+----------+--------------+ CFV      Full                                                        +---------+---------------+---------+-----------+----------+--------------+ SFJ      Full                                                        +---------+---------------+---------+-----------+----------+--------------+ FV Prox  Full                                                        +---------+---------------+---------+-----------+----------+--------------+ FV Mid   Full                                                        +---------+---------------+---------+-----------+----------+--------------+ FV DistalFull                                                        +---------+---------------+---------+-----------+----------+--------------+  PFV      Full                                                        +---------+---------------+---------+-----------+----------+--------------+ POP      Full           Yes      Yes                                 +---------+---------------+---------+-----------+----------+--------------+ PTV      None                                         Acute          +---------+---------------+---------+-----------+----------+--------------+ PERO     None                                         Acute          +---------+---------------+---------+-----------+----------+--------------+     Summary: Right: Findings consistent with acute deep vein thrombosis involving the right popliteal vein, right posterior tibial veins, and right peroneal veins. Left: Findings consistent with acute deep vein thrombosis involving the left posterior tibial veins, and left peroneal veins.  *See table(s) above for measurements and observations.  Electronically signed by Servando Snare MD on 07/09/2019 at 3:46:42 PM.    Final        LOS: 6 days   [facility] Pager on [email or web address]  07/10/2019, 10:30 AM

## 2019-07-10 NOTE — Progress Notes (Signed)
Pt continues to desat into the low 80's when ambulating to restroom and back.

## 2019-07-10 NOTE — Progress Notes (Signed)
Tele called about HR 160's. PRN metroprol 2.5mg .

## 2019-07-10 NOTE — Progress Notes (Signed)
Paged MD due to HR continuing to range from 130-160's despite 2 doses of iv metoprolol and 2 doses of digoxin that was ordered.  MD said to hold am meds (solumedrol, lovenox) at this time and that he is coming to the floor to assess pt himself.

## 2019-07-11 LAB — BASIC METABOLIC PANEL
Anion gap: 11 (ref 5–15)
BUN: 15 mg/dL (ref 8–23)
CO2: 26 mmol/L (ref 22–32)
Calcium: 8.4 mg/dL — ABNORMAL LOW (ref 8.9–10.3)
Chloride: 103 mmol/L (ref 98–111)
Creatinine, Ser: 0.84 mg/dL (ref 0.61–1.24)
GFR calc Af Amer: >60 mL/min (ref >60)
GFR calc non Af Amer: >60 mL/min (ref >60)
Glucose, Bld: 85 mg/dL (ref 70–99)
Potassium: 4.1 mmol/L (ref 3.5–5.1)
Sodium: 140 mmol/L (ref 135–145)

## 2019-07-11 LAB — TSH: TSH: 3.152 u[IU]/mL (ref 0.350–4.500)

## 2019-07-11 LAB — PROTIME-INR
INR: 1.8 — ABNORMAL HIGH (ref 0.8–1.2)
Prothrombin Time: 20.6 seconds — ABNORMAL HIGH (ref 11.4–15.2)

## 2019-07-11 LAB — CBC
HCT: 43.2 % (ref 39.0–52.0)
Hemoglobin: 13.3 g/dL (ref 13.0–17.0)
MCH: 28.4 pg (ref 26.0–34.0)
MCHC: 30.8 g/dL (ref 30.0–36.0)
MCV: 92.1 fL (ref 80.0–100.0)
Platelets: 113 10*3/uL — ABNORMAL LOW (ref 150–400)
RBC: 4.69 MIL/uL (ref 4.22–5.81)
RDW: 15.8 % — ABNORMAL HIGH (ref 11.5–15.5)
WBC: 16.4 10*3/uL — ABNORMAL HIGH (ref 4.0–10.5)
nRBC: 0 % (ref 0.0–0.2)

## 2019-07-11 LAB — DIGOXIN LEVEL: Digoxin Level: <0.2 ng/mL — ABNORMAL LOW (ref 0.8–2.0)

## 2019-07-11 LAB — D-DIMER, QUANTITATIVE: D-Dimer, Quant: 4.93 ug/mL-FEU — ABNORMAL HIGH (ref 0.00–0.50)

## 2019-07-11 LAB — T4, FREE: Free T4: 1.27 ng/dL — ABNORMAL HIGH (ref 0.61–1.12)

## 2019-07-11 MED ORDER — RIVAROXABAN 15 MG PO TABS
15.0000 mg | ORAL_TABLET | Freq: Two times a day (BID) | ORAL | Status: DC
  Administered 2019-07-11 – 2019-07-12 (×2): 15 mg via ORAL
  Filled 2019-07-11 (×4): qty 1

## 2019-07-11 MED ORDER — METOPROLOL TARTRATE 50 MG PO TABS
50.0000 mg | ORAL_TABLET | Freq: Two times a day (BID) | ORAL | Status: DC
  Administered 2019-07-11 – 2019-07-12 (×3): 50 mg via ORAL
  Filled 2019-07-11 (×3): qty 1

## 2019-07-11 MED ORDER — DIGOXIN 250 MCG PO TABS
0.2500 mg | ORAL_TABLET | Freq: Every day | ORAL | Status: DC
  Administered 2019-07-11 – 2019-07-12 (×2): 0.25 mg via ORAL
  Filled 2019-07-11: qty 1
  Filled 2019-07-11: qty 2

## 2019-07-11 MED ORDER — RIVAROXABAN 20 MG PO TABS: 20.0000 mg | ORAL_TABLET | Freq: Every day | ORAL | Status: DC

## 2019-07-11 NOTE — Plan of Care (Signed)

## 2019-07-11 NOTE — Progress Notes (Addendum)
PROGRESS NOTE  Neil Bailey [id number] DOB: 1950/08/19 DOA: 07/04/2019  PCP: [name], MD  Brief History/Interval Summary: 68 year old Caucasian male with a past medical history of atrial fibrillation on warfarin, history of DVT, former smoker, who initially presented with presyncope.  He was also noted to have a new cough.  Was noted to be mildly confused according to his spouse.  In the emergency department he was noted to be febrile.  He tested positive for COVID-19.  Chest x-ray showed pneumonia.  CT head did not show any acute findings.  EKG showed atrial fibrillation.  Initially he was requiring 2 L of oxygen.  He was hospitalized at [facility].  He was started on steroids and remdesivir.  His oxygen requirement slowly started climbing.  Subsequently he was transferred here to [facility].  Reason for Visit: Acute respiratory failure with hypoxia.  Pneumonia due to COVID-19.  Consultants: None  Procedures: None  Antibiotics: Anti-infectives (From admission, onward)   Start     Dose/Rate Route Frequency Ordered Stop   07/05/19 1000  remdesivir 100 mg in sodium chloride 0.9 % 100 mL IVPB     100 mg 200 mL/hr over 30 Minutes Intravenous Daily 07/04/19 1806 07/05/19 1900   07/05/19 0200  azithromycin (ZITHROMAX) 500 mg in sodium chloride 0.9 % 250 mL IVPB     500 mg 250 mL/hr over 60 Minutes Intravenous Every 24 hours 07/04/19 1723 07/07/19 0354   07/04/19 1730  cefTAZidime (FORTAZ) 2 g in sodium chloride 0.9 % 100 mL IVPB     2 g 200 mL/hr over 30 Minutes Intravenous Every 8 hours 07/04/19 1723 07/09/19 0646      Subjective/Interval History: Patient states that he is feeling better this morning.  In better spirits.  Heart rate is better controlled.  He slept well overnight.  Looking forward to walking in the hallway.      Assessment/Plan:  Acute Hypoxic Resp. Failure/Pneumonia due to COVID-19   Recent Labs  Lab 07/05/19 0517 07/06/19 0443  07/07/19 0225 07/07/19 0255 07/08/19 0437 07/09/19 0414 07/10/19 0311 07/11/19 0245  DDIMER 3.26* 3.22* 4.25*  --  >20.00* >20.00* 11.04* 4.93*  CRP 6.5* 3.3*  --  1.6* 1.1* 0.9  --   --   ALT 22 24 27   --  61* 51*  --   --   PROCALCITON 0.68 0.28  --   --   --   --   --   --     Objective findings: Fever: Remains afebrile Oxygen requirements: Nasal cannula.  4 L/min.  Saturating in the mid 90s.    COVID 19 Therapeutics: Antibacterials: Completed course of Fortaz and azithromycin. Remdesivir: Completed course on 12/25. Steroids: Now on prednisone 40 mg daily Diuretics: He was given a dose of Lasix on 12/25.   Actemra: Could not be given due to elevated procalcitonin Convalescent Plasma: Unfortunately had transfusion reaction to plasma on 12/25 Vitamin C and Zinc: Continue PUD Prophylaxis: Pepcid DVT Prophylaxis: Changed over from warfarin to Lovenox due to new DVT  Patient seems to be stable from a respiratory standpoint.  He still requiring anywhere between 2 to 4 L of oxygen.  Requires for when he is ambulating.  He has completed course of remdesivir.  Steroids being tapered.  He has completed course of antibacterials.  Feels better.  Continue to mobilize him.  Continue incentive spirometry.  Leukocytosis is due to steroids.  Acute lower extremity DVT with previous history of DVT Patient developed  acute DVT despite being on warfarin with therapeutic INR.  This is likely due to hypercoagulable state from COVID-19.  Could also be considered failure of warfarin.  He was switched over to Lovenox.  Change to rivaroxaban today.  D-dimer improved to 4.93 from a peak of greater than 20.   Atrial fibrillation now with RVR, history of chronic atrial fibrillation Heart rate has been poorly controlled over the last few days.  He was not on any rate limiting drugs at home.  Patient was started on metoprolol however this is limited by borderline low blood pressures.  Subsequently he was started  on digoxin.  Heart rate seems to be better controlled today.  Blood pressure is also improved.  Can also go up on the dose of metoprolol.  Digoxin level subtherapeutic.  He is followed by cardiology in the [place] area.  No echocardiogram in our system.  This can be pursued by his cardiology after discharge.  Inhalers were discontinued as they could have contributed to his tachycardia.  Was on warfarin prior to arrival but developed DVT while being therapeutic on warfarin.  Will be changed over to rivaroxaban today.  TSH 3.152.  Free T4 1.27.  Recommend rechecking them in 3 to 4 weeks.  Acute metabolic encephalopathy CT head did not show any acute findings.  Encephalopathy most likely due to acute illness from COVID-19. Seems to be close to his baseline. Still occasionally distracted.  Thrombocytopenia  Platelet counts were low most likely due to acute illness. Improved.  Hypokalemia Magnesium has been normal.  Potassium 3.7 today.  Will supplement as he is now on digoxin.  Obesity Estimated body mass index is 40.37 kg/m as calculated from the following:   Height as of this encounter: 5\' 9"  (1.753 m).   Weight as of this encounter: 124 kg.   DVT Prophylaxis: Lovenox therapeutic dose Code Status: Full code Family Communication: Wife being updated on a daily basis. Disposition Plan: Ambulate today. If he does well with ambulation then hopefully he can be discharged tomorrow as long as heart rate is reasonably well controlled.  We will change him to rivaroxaban today.   Medications:  Scheduled: . vitamin C  500 mg Oral Daily  . digoxin  0.25 mg Oral Daily  . docusate sodium  100 mg Oral BID  . enoxaparin (LOVENOX) injection  120 mg Subcutaneous BID  . famotidine  20 mg Oral Daily  . metoprolol tartrate  50 mg Oral BID  . multivitamin with minerals  1 tablet Oral Daily  . predniSONE  40 mg Oral Q breakfast  . zinc sulfate  220 mg Oral Daily   Continuous:  [id number],  benzonatate, diphenhydrAMINE, metoprolol tartrate, ondansetron **OR** ondansetron (ZOFRAN) IV   Objective:  Vital Signs  Vitals:   07/11/19 0500 07/11/19 0600 07/11/19 0802 07/11/19 1029  BP:   113/78   Pulse: (!) 33 (!) 123 77 86  Resp:   (!) 26   Temp:      TempSrc:      SpO2: (!) 82% 92% 100%   Weight:      Height:        Intake/Output Summary (Last 24 hours) at 07/11/2019 1337 Last data filed at 07/11/2019 0807 Gross per 24 hour  Intake 360 ml  Output 2560 ml  Net -2200 ml   Filed Weights   07/04/19 1945  Weight: 124 kg    General appearance: Awake alert.  In no distress Resp: Improved air entry bilaterally.  Few  crackles at the bases.  No wheezing or rhonchi Cardio: S1-S2 is irregularly irregular.  Telemetry shows heart rate between 90 to 110. GI: Abdomen is soft.  Nontender nondistended.  Bowel sounds are present normal.  No masses organomegaly Extremities: Lateral lower extremity edema Neurologic: Alert and oriented x3.  No focal neurological deficits.       Lab Results:  Data Reviewed: I have personally reviewed following labs and imaging studies  CBC: Recent Labs  Lab 07/05/19 0517 07/06/19 0443 07/07/19 0225 07/08/19 0437 Jul 28, 2019 0414 07/10/19 0311 07/11/19 0245  WBC 4.7 4.9 8.7 15.8* 18.4* 15.5* 16.4*  NEUTROABS 3.5 3.9 7.1 12.3* 13.2*  --   --   HGB 15.8 12.6* 12.8* 13.6 14.2 13.4 13.3  HCT 49.7 38.8* 40.2 43.1 45.0 42.5 43.2  MCV 89.7 88.0 89.1 89.8 90.7 90.6 92.1  PLT 75* 80* 122* 114* 110* 108* 113*    Basic Metabolic Panel: Recent Labs  Lab 07/05/19 0517 07/06/19 0443 07/07/19 0225 07/08/19 0437 07-28-19 0414 07/10/19 0311 07/11/19 0245  NA 138 138 140 141 141 141 140  K 3.4* 3.9 3.9 4.2 3.8 3.7 4.1  CL 100 103 104 104 104 106 103  CO2 23 26 25 27 28 26 26   GLUCOSE 162* 140* 141* 114* 80 85 85  BUN 21 22 22  24* 22 18 15   CREATININE 1.09 0.92 0.90 0.87 0.99 0.93 0.84  CALCIUM 7.9* 8.2* 8.4* 8.7* 8.7* 8.2* 8.4*  MG 2.1  2.3 2.4 2.4 2.2  --   --     GFR: Estimated Creatinine Clearance: 109.5 mL/min (by C-G formula based on SCr of 0.84 mg/dL).  Liver Function Tests: Recent Labs  Lab 07/05/19 0517 07/06/19 0443 07/07/19 0225 07/08/19 0437 07-28-2019 0414  AST 44* 37 35 54* 35  ALT 22 24 27  61* 51*  ALKPHOS 38 35* 36* 47 48  BILITOT 1.1 0.7 0.5 1.2 1.4*  PROT 6.7 5.8* 5.9* 6.4* 6.1*  ALBUMIN 3.2* 2.9* 2.9* 3.4* 3.2*     Coagulation Profile: Recent Labs  Lab 07/07/19 0225 07/08/19 0437 2019-07-28 0414 07/10/19 0311 07/11/19 0245  INR 3.2* 3.1* 2.6* 2.1* 1.8*    CBG: No results for input(s): GLUCAP in the last 168 hours.    Recent Results (from the past 240 hour(s))  Urine Culture     Status: None   Collection Time: 07/02/19  6:52 PM   Specimen: Urine, Random  Result Value Ref Range Status   Specimen Description   Final    URINE, RANDOM Performed at [facility], [address], [place], [place] [address]    Special Requests   Final    NONE Performed at [facility], [address], [place], [place] [address]    Culture   Final    NO GROWTH Performed at [facility], [place] [address] [place], [place] [address]    Report Status 07/04/2019 FINAL  Final      Radiology Studies: LE Venous  Result Date: 07/28/2019  Lower Venous Study Indications: Covid-19, elevatd D-Dimer.  Comparison Study: No prior study on file for comparison Performing Technologist: Sharion Dove RVS  Examination Guidelines: A complete evaluation includes B-mode imaging, spectral Doppler, color Doppler, and power Doppler as needed of all accessible portions of each vessel. Bilateral testing is considered an integral part of a complete examination. Limited examinations for reoccurring indications may be performed as noted.  +---------+---------------+---------+-----------+----------+--------------+ RIGHT    CompressibilityPhasicitySpontaneityPropertiesThrombus Aging  +---------+---------------+---------+-----------+----------+--------------+ CFV  Full           Yes      Yes                                 +---------+---------------+---------+-----------+----------+--------------+ SFJ      Full                                                        +---------+---------------+---------+-----------+----------+--------------+ FV Prox  Full                                                        +---------+---------------+---------+-----------+----------+--------------+ FV Mid   Full                                                        +---------+---------------+---------+-----------+----------+--------------+ FV DistalFull                                                        +---------+---------------+---------+-----------+----------+--------------+ PFV      Full                                                        +---------+---------------+---------+-----------+----------+--------------+ POP      Partial        No       No                   Acute          +---------+---------------+---------+-----------+----------+--------------+ PTV      None                                         Acute          +---------+---------------+---------+-----------+----------+--------------+ PERO     None                                         Acute          +---------+---------------+---------+-----------+----------+--------------+   +---------+---------------+---------+-----------+----------+--------------+ LEFT     CompressibilityPhasicitySpontaneityPropertiesThrombus Aging +---------+---------------+---------+-----------+----------+--------------+ CFV      Full                                                        +---------+---------------+---------+-----------+----------+--------------+ SFJ  Full                                                         +---------+---------------+---------+-----------+----------+--------------+ FV Prox  Full                                                        +---------+---------------+---------+-----------+----------+--------------+ FV Mid   Full                                                        +---------+---------------+---------+-----------+----------+--------------+ FV DistalFull                                                        +---------+---------------+---------+-----------+----------+--------------+ PFV      Full                                                        +---------+---------------+---------+-----------+----------+--------------+ POP      Full           Yes      Yes                                 +---------+---------------+---------+-----------+----------+--------------+ PTV      None                                         Acute          +---------+---------------+---------+-----------+----------+--------------+ PERO     None                                         Acute          +---------+---------------+---------+-----------+----------+--------------+     Summary: Right: Findings consistent with acute deep vein thrombosis involving the right popliteal vein, right posterior tibial veins, and right peroneal veins. Left: Findings consistent with acute deep vein thrombosis involving the left posterior tibial veins, and left peroneal veins.  *See table(s) above for measurements and observations. Electronically signed by Servando Snare MD on 07/09/2019 at 3:46:42 PM.    Final        LOS: 7 days   [facility] Pager on [email or web address]  07/11/2019, 1:37 PM

## 2019-07-11 NOTE — Progress Notes (Addendum)
Neil Bailey for Xarelto Indication: atrial fibrillation, acute DVT  Allergies  Allergen Reactions  . Plasma, Human Shortness Of Breath, Anxiety, Other (See Comments), Cough and Hypertension    Redness    Patient Measurements: Height: 5\' 9"  (175.3 cm) Weight: 273 lb 5.9 oz (124 kg) IBW/kg (Calculated) : 70.7  Vital Signs: Temp: 98.5 F (36.9 C) (12/31 0430) Temp Source: Oral (12/31 0430) BP: 113/78 (12/31 0802) Pulse Rate: 86 (12/31 1029)  Labs: Recent Labs    07/09/19 0414 07/10/19 0311 07/11/19 0245  HGB 14.2 13.4 13.3  HCT 45.0 42.5 43.2  PLT 110* 108* 113*  LABPROT 27.6* 23.4* 20.6*  INR 2.6* 2.1* 1.8*  CREATININE 0.99 0.93 0.84   Estimated Creatinine Clearance: 109.5 mL/min (by C-G formula based on SCr of 0.84 mg/dL).   Assessment: Patient presented to ED with near syncope.  Pt has a h/o afib, DVT on warfarin PTA.    D-dimer elevated >20.  Dopplers reveal acute DVT in the setting of COVID and therapeutic INR. Patient has been on Lovenox. Pharmacy consulted to transition to [place]. No bleeding noted, Hgb stable 13-14s, platelets low but trending up.  Plan:  Xarelto 15 mg PO bid with meals for 21 days then 20 mg PO daily with supper Monitor for s/sx of bleeding Education prior to discharge  Thank you for involving pharmacy in this patient's care.  Renold Genta, PharmD, BCPS Clinical Pharmacist Clinical phone for 07/11/2019 until 4p is [phone] 07/11/2019 2:12 PM  **Pharmacist phone directory can be found on [email or web address] listed under [place]**

## 2019-07-11 NOTE — Discharge Instructions (Addendum)
Follow with Primary MD [name], MD in 7 days   Get CBC, CMP, 2 view Chest X ray -  checked next visit within 1 week by Primary MD   Activity: As tolerated with Full fall precautions use walker/cane & assistance as needed  Disposition Home   Diet: Heart Healthy    Special Instructions: If you have smoked or chewed Tobacco  in the last 2 yrs please stop smoking, stop any regular Alcohol  and or any Recreational drug use.  On your next visit with your primary care physician please Get Medicines reviewed and adjusted.  Please request your Prim.MD to go over all Hospital Tests and Procedure/Radiological results at the follow up, please get all Hospital records sent to your Prim MD by signing hospital release before you go home.  If you experience worsening of your admission symptoms, develop shortness of breath, life threatening emergency, suicidal or homicidal thoughts you must seek medical attention immediately by calling 911 or calling your MD immediately  if symptoms less severe.  You Must read complete instructions/literature along with all the possible adverse reactions/side effects for all the Medicines you take and that have been prescribed to you. Take any new Medicines after you have completely understood and accpet all the possible adverse reactions/side effects.        Person Under Monitoring Name: Neil Bailey  Location: [address] [address]   Infection Prevention Recommendations for Individuals Confirmed to have, or Being Evaluated for, 2019 Novel Coronavirus (COVID-19) Infection Who Receive Care at Home  Individuals who are confirmed to have, or are being evaluated for, COVID-19 should follow the prevention steps below until a healthcare provider or local or state health department says they can return to normal activities.  Stay home except to get medical care You should restrict activities outside your home, except for getting medical  care. Do not go to work, school, or public areas, and do not use public transportation or taxis.  Call ahead before visiting your doctor Before your medical appointment, call the healthcare provider and tell them that you have, or are being evaluated for, COVID-19 infection. This will help the healthcare provider's office take steps to keep other people from getting infected. Ask your healthcare provider to call the local or state health department.  Monitor your symptoms Seek prompt medical attention if your illness is worsening (e.g., difficulty breathing). Before going to your medical appointment, call the healthcare provider and tell them that you have, or are being evaluated for, COVID-19 infection. Ask your healthcare provider to call the local or state health department.  Wear a facemask You should wear a facemask that covers your nose and mouth when you are in the same room with other people and when you visit a healthcare provider. People who live with or visit you should also wear a facemask while they are in the same room with you.  Separate yourself from other people in your home As much as possible, you should stay in a different room from other people in your home. Also, you should use a separate bathroom, if available.  Avoid sharing household items You should not share dishes, drinking glasses, cups, eating utensils, towels, bedding, or other items with other people in your home. After using these items, you should wash them thoroughly with soap and water.  Cover your coughs and sneezes Cover your mouth and nose with a tissue when you cough or sneeze, or you can cough or sneeze  into your sleeve. Throw used tissues in a lined trash can, and immediately wash your hands with soap and water for at least 20 seconds or use an alcohol-based hand rub.  Wash your [facility] your hands often and thoroughly with soap and water for at least 20 seconds. You can use an alcohol-based  hand sanitizer if soap and water are not available and if your hands are not visibly dirty. Avoid touching your eyes, nose, and mouth with unwashed hands.   Prevention Steps for Caregivers and Household Members of Individuals Confirmed to have, or Being Evaluated for, COVID-19 Infection Being Cared for in the Home  If you live with, or provide care at home for, a person confirmed to have, or being evaluated for, COVID-19 infection please follow these guidelines to prevent infection:  Follow healthcare provider's instructions Make sure that you understand and can help the patient follow any healthcare provider instructions for all care.  Provide for the patient's basic needs You should help the patient with basic needs in the home and provide support for getting groceries, prescriptions, and other personal needs.  Monitor the patient's symptoms If they are getting sicker, call his or her medical provider and tell them that the patient has, or is being evaluated for, COVID-19 infection. This will help the healthcare provider's office take steps to keep other people from getting infected. Ask the healthcare provider to call the local or state health department.  Limit the number of people who have contact with the patient  If possible, have only one caregiver for the patient.  Other household members should stay in another home or place of residence. If this is not possible, they should stay  in another room, or be separated from the patient as much as possible. Use a separate bathroom, if available.  Restrict visitors who do not have an essential need to be in the home.  Keep older adults, very young children, and other sick people away from the patient Keep older adults, very young children, and those who have compromised immune systems or chronic health conditions away from the patient. This includes people with chronic heart, lung, or kidney conditions, diabetes, and  cancer.  Ensure good ventilation Make sure that shared spaces in the home have good air flow, such as from an air conditioner or an opened window, weather permitting.  Wash your hands often  Wash your hands often and thoroughly with soap and water for at least 20 seconds. You can use an alcohol based hand sanitizer if soap and water are not available and if your hands are not visibly dirty.  Avoid touching your eyes, nose, and mouth with unwashed hands.  Use disposable paper towels to dry your hands. If not available, use dedicated cloth towels and replace them when they become wet.  Wear a facemask and gloves  Wear a disposable facemask at all times in the room and gloves when you touch or have contact with the patient's blood, body fluids, and/or secretions or excretions, such as sweat, saliva, sputum, nasal mucus, vomit, urine, or feces.  Ensure the mask fits over your nose and mouth tightly, and do not touch it during use.  Throw out disposable facemasks and gloves after using them. Do not reuse.  Wash your hands immediately after removing your facemask and gloves.  If your personal clothing becomes contaminated, carefully remove clothing and launder. Wash your hands after handling contaminated clothing.  Place all used disposable facemasks, gloves, and other waste in  a lined container before disposing them with other household waste.  Remove gloves and wash your hands immediately after handling these items.  Do not share dishes, glasses, or other household items with the patient  Avoid sharing household items. You should not share dishes, drinking glasses, cups, eating utensils, towels, bedding, or other items with a patient who is confirmed to have, or being evaluated for, COVID-19 infection.  After the person uses these items, you should wash them thoroughly with soap and water.  Wash laundry thoroughly  Immediately remove and wash clothes or bedding that have blood, body  fluids, and/or secretions or excretions, such as sweat, saliva, sputum, nasal mucus, vomit, urine, or feces, on them.  Wear gloves when handling laundry from the patient.  Read and follow directions on labels of laundry or clothing items and detergent. In general, wash and dry with the warmest temperatures recommended on the label.  Clean all areas the individual has used often  Clean all touchable surfaces, such as counters, tabletops, doorknobs, bathroom fixtures, toilets, phones, keyboards, tablets, and bedside tables, every day. Also, clean any surfaces that may have blood, body fluids, and/or secretions or excretions on them.  Wear gloves when cleaning surfaces the patient has come in contact with.  Use a diluted bleach solution (e.g., dilute bleach with 1 part bleach and 10 parts water) or a household disinfectant with a label that says EPA-registered for coronaviruses. To make a bleach solution at home, add 1 tablespoon of bleach to 1 quart (4 cups) of water. For a larger supply, add  cup of bleach to 1 gallon (16 cups) of water.  Read labels of cleaning products and follow recommendations provided on product labels. Labels contain instructions for safe and effective use of the cleaning product including precautions you should take when applying the product, such as wearing gloves or eye protection and making sure you have good ventilation during use of the product.  Remove gloves and wash hands immediately after cleaning.  Monitor yourself for signs and symptoms of illness Caregivers and household members are considered close contacts, should monitor their health, and will be asked to limit movement outside of the home to the extent possible. Follow the monitoring steps for close contacts listed on the symptom monitoring form.   ? If you have additional questions, contact your local health department or call the epidemiologist on call at [phone] (available 24/7). ? This  guidance is subject to change. For the most up-to-date guidance from [facility], please refer to their website: YouBlogs.pl    Information on my medicine - XARELTO (rivaroxaban)  This medication education was reviewed with me or my healthcare representative as part of my discharge preparation.  The pharmacist that spoke with me during my hospital stay was:  [facility], Margot Chimes, [place]? 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  What do you need to know about Xarelto? The starting dose is one 15 mg tablet taken TWICE daily with food for the FIRST 21 DAYS then on 08/02/19  the dose is changed to one 20 mg tablet taken ONCE A DAY with your evening meal.  DO NOT stop taking Xarelto without talking to the health care provider who prescribed the medication.  Refill your prescription for 20 mg tablets before you run out.  After discharge, you should have regular check-up appointments with your healthcare provider that is prescribing your Xarelto.  In the future your dose may need to be changed if your kidney function changes by a significant amount.  What do you do if you miss a dose? If you are taking Xarelto TWICE DAILY and you miss a dose, take it as soon as you remember. You may take two 15 mg tablets (total 30 mg) at the same time then resume your regularly scheduled 15 mg twice daily the next day.  If you are taking Xarelto ONCE DAILY and you miss a dose, take it as soon as you remember on the same day then continue your regularly scheduled once daily regimen the next day. Do not take two doses of Xarelto at the same time.   Important Safety Information Xarelto is a blood thinner medicine that can cause bleeding. You should call  your healthcare provider right away if you experience any of the following: ? Bleeding from an injury or your nose that does not stop. ? Unusual colored urine (red or dark brown) or unusual colored stools (red or black). ? Unusual bruising for unknown reasons. ? A serious fall or if you hit your head (even if there is no bleeding).  Some medicines may interact with Xarelto and might increase your risk of bleeding while on Xarelto. To help avoid this, consult your healthcare provider or pharmacist prior to using any new prescription or non-prescription medications, including herbals, vitamins, non-steroidal anti-inflammatory drugs (NSAIDs) and supplements.  This website has more information on Xarelto: [email or web address]-benson.[email or web address]

## 2019-07-11 NOTE — Progress Notes (Signed)
Called and spoke with spouse on updates.

## 2019-07-12 DIAGNOSIS — J1282 Pneumonia due to coronavirus disease 2019: Secondary | ICD-10-CM

## 2019-07-12 LAB — BASIC METABOLIC PANEL
Anion gap: 10 (ref 5–15)
BUN: 14 mg/dL (ref 8–23)
CO2: 30 mmol/L (ref 22–32)
Calcium: 8.8 mg/dL — ABNORMAL LOW (ref 8.9–10.3)
Chloride: 103 mmol/L (ref 98–111)
Creatinine, Ser: 0.98 mg/dL (ref 0.61–1.24)
GFR calc Af Amer: >60 mL/min (ref >60)
GFR calc non Af Amer: >60 mL/min (ref >60)
Glucose, Bld: 81 mg/dL (ref 70–99)
Potassium: 4 mmol/L (ref 3.5–5.1)
Sodium: 143 mmol/L (ref 135–145)

## 2019-07-12 MED ORDER — METHYLPREDNISOLONE 4 MG PO TBPK: ORAL_TABLET | ORAL | 0 refills | Status: DC

## 2019-07-12 MED ORDER — DIGOXIN 250 MCG PO TABS: 0.2500 mg | ORAL_TABLET | Freq: Every day | ORAL | 0 refills | Status: DC

## 2019-07-12 MED ORDER — PREDNISONE 20 MG PO TABS: 20.0000 mg | ORAL_TABLET | Freq: Every day | ORAL | Status: DC

## 2019-07-12 MED ORDER — ALBUTEROL SULFATE HFA 108 (90 BASE) MCG/ACT IN AERS: 2.0000 | INHALATION_SPRAY | Freq: Four times a day (QID) | RESPIRATORY_TRACT | 0 refills | Status: DC | PRN

## 2019-07-12 MED ORDER — FAMOTIDINE 20 MG PO TABS: 20.0000 mg | ORAL_TABLET | Freq: Every day | ORAL | 0 refills | Status: DC

## 2019-07-12 MED ORDER — FUROSEMIDE 20 MG PO TABS
40.0000 mg | ORAL_TABLET | Freq: Once | ORAL | Status: AC
  Administered 2019-07-12: 09:00:00 40 mg via ORAL
  Filled 2019-07-12: qty 2

## 2019-07-12 MED ORDER — METOPROLOL TARTRATE 50 MG PO TABS: 50.0000 mg | ORAL_TABLET | Freq: Two times a day (BID) | ORAL | 0 refills | Status: DC

## 2019-07-12 MED ORDER — RIVAROXABAN 15 MG PO TABS: 15.0000 mg | ORAL_TABLET | Freq: Two times a day (BID) | ORAL | 0 refills | Status: DC

## 2019-07-12 NOTE — Plan of Care (Signed)
Pt A&Ox4. VSS, SpO2 >88% on RA. O2 walk test completed, see notes for details. No c/o pain throughout shift. Utilizing urinal at bedside w/o issue  Reviewed AVS d/c paperwork w/ pt. Stated understanding & all questions answered. Signed CDC form & placed in pt d/c bin.   Pt brought to d/c area where family picked up pt. Pt left unit w. All belongings  Tomie China    Problem: Education: Goal: Knowledge of risk factors and measures for prevention of condition will improve Outcome: Adequate for Discharge   Problem: Coping: Goal: Psychosocial and spiritual needs will be supported Outcome: Adequate for Discharge   Problem: Respiratory: Goal: Will maintain a patent airway Outcome: Adequate for Discharge Goal: Complications related to the disease process, condition or treatment will be avoided or minimized Outcome: Adequate for Discharge   Problem: Education: Goal: Knowledge of risk factors and measures for prevention of condition will improve Outcome: Adequate for Discharge   Problem: Coping: Goal: Psychosocial and spiritual needs will be supported Outcome: Adequate for Discharge   Problem: Respiratory: Goal: Will maintain a patent airway Outcome: Adequate for Discharge Goal: Complications related to the disease process, condition or treatment will be avoided or minimized Outcome: Adequate for Discharge

## 2019-07-12 NOTE — TOC Transition Note (Signed)
Transition of Care [facility]) - CM/SW Discharge Note   Patient Details  Name: Neil Bailey MRN: [id number] Date of Birth: June 03, 1951  Transition of [facility]) CM/SW Contact:  Ninfa Meeker, RN Phone Number: 07/12/2019, 10:10 AM   Clinical Narrative:   No case management needs identified for patient. He will discharge home. CM has signed off.         Patient Goals and CMS Choice        Discharge Placement                       Discharge Plan and Services                                     Social Determinants of Health (SDOH) Interventions     Readmission Risk Interventions No flowsheet data found.

## 2019-07-12 NOTE — Progress Notes (Signed)
O2 Walk Test:  - O2 on RA at rest: 96%  - O2 Ambulating on RA: 91%  - O2 recovering after walk test on RA: 94%  Neil Bailey A Naliya Gish

## 2019-07-12 NOTE — Discharge Summary (Signed)
Neil Bailey UPJ:[id number] DOB: 1950-09-10 DOA: 07/04/2019  PCP: [name], MD  Admit date: 07/04/2019  Discharge date: 07/12/2019  Admitted From: Home   Disposition:  Home   Recommendations for Outpatient Follow-up:   Follow up with PCP in 1-2 weeks  PCP Please obtain BMP/CBC, 2 view CXR in 1week,  (see Discharge instructions)   PCP Please follow up on the following pending results: Readdress Xarelto dose in 1 month, repeat CBC, CMP, TSH, T3 and free T4 in 7 to 10 days.  Outpatient cardiology follow-up for A. fib.   Home Health: None   Equipment/Devices: o2 2lit/min PRN  Consultations: None  Discharge Condition: Stable    CODE STATUS: Full    Diet Recommendation: Heart Healthy   CC - SOB   Brief history of present illness from the day of admission and additional interim summary     69 year old Caucasian male with a past medical history of atrial fibrillation on warfarin, history of DVT, former smoker,who initially presented with presyncope. He was also noted to have a new cough. Was noted to be mildly confused according to his spouse. In the emergency department he was noted to be febrile. He tested positive for COVID-19. Chest x-ray showedpneumonia. CT headdid not show any acute findings. EKG showed atrial fibrillation. Initially he was requiring 2 L of oxygen.  He was hospitalized at [facility]. He was started on steroids and remdesivir. His oxygen requirement slowly started climbing. Subsequently he was transferred here to [facility]   Acute Hypoxic Resp. Failure/Pneumonia due to COVID-19 - he had moderate to severe disease he was treated with combination of remdesivir, steroids, he is much  improved and currently on room air at rest, upon ambulation at times he drops below 87% hence he will get as needed 2 L nasal cannula oxygen upon discharge along with oral steroid taper.  Follow-up with PCP in 7 to 10 days.   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  This is likely due to hypercoagulable state from COVID-19.  Could also be considered failure of warfarin.  He is now placed on Xarelto, PCP to switch his dose after 1 month as appropriate.   Atrial fibrillation now with RVR, history of chronic atrial fibrillation Likely due to hypoxia from COVID-19 pneumonia, rate was hard to control but finally was controlled with combination of beta-blocker and digoxin, [place] vas 2 score of at least 3 for which she has been placed now on Xarelto.  Will follow with cardiology outpatient.  TSH was stable but free T4 was slightly on the abnormal side.  Will request PCP to repeat in 7 to 10 days.  Acute metabolic encephalopathy CT head did not show any acute findings.  Encephalopathy most likely due to acute illness from COVID-19.  Now at baseline.  Thrombocytopenia  Platelet counts were low most due to acute viral illness, no signs of bleeding, PCP to repeat CBC in 7 to 10 days.  Obesity BMI of 40 follow with PCP for  weight loss  Discharge diagnosis     Principal Problem:   Pneumonia due to COVID-19 virus Active Problems:   Acute respiratory failure with hypoxia (HCC)   Atrial fibrillation, chronic (HCC)    Discharge instructions    Discharge Instructions    Diet - low sodium heart healthy   Complete by: As directed    Discharge instructions   Complete by: As directed    Follow with Primary MD [name], MD in 7 days   Get CBC, CMP, 2 view Chest X ray -  checked next visit within 1 week by Primary MD   Activity: As tolerated with Full fall precautions use walker/cane & assistance as needed  Disposition Home   Diet: Heart Healthy    Special Instructions: If you have smoked or chewed Tobacco  in the last 2 yrs please stop smoking, stop any regular Alcohol  and or any Recreational drug use.  On your next visit with your primary care physician please Get Medicines reviewed and adjusted.  Please request your Prim.MD to go over all Hospital Tests and Procedure/Radiological results at the follow up, please get all Hospital records sent to your Prim MD by signing hospital release before you go home.  If you experience worsening of your admission symptoms, develop shortness of breath, life threatening emergency, suicidal or homicidal thoughts you must seek medical attention immediately by calling 911 or calling your MD immediately  if symptoms less severe.  You Must read complete instructions/literature along with all the possible adverse reactions/side effects for all the Medicines you take and that have been prescribed to you. Take any new Medicines after you have completely understood and accpet all the possible adverse reactions/side effects.   Increase activity slowly   Complete by: As directed    MyChart COVID-19 home monitoring program   Complete by: Jul 12, 2019    Is the patient willing to use the [employer] for home monitoring?: Yes   Temperature monitoring   Complete by:  Jul 12, 2019    After how many days would you like to receive a notification of this patient's flowsheet entries?: 1      Discharge Medications   Allergies as of 07/12/2019  Reactions   Plasma, Human Shortness Of Breath, Anxiety, Other (See Comments), Cough, Hypertension   Redness      Medication List    STOP taking these medications   azithromycin 500 mg in sodium chloride 0.9 % 250 mL   benzonatate 200 MG capsule Commonly known as: TESSALON   cefTAZidime 2 g in sodium chloride 0.9 % 100 mL   dexamethasone 10 MG/ML injection Commonly known as: DECADRON   warfarin 5 MG tablet Commonly known as: COUMADIN     TAKE these medications   acetaminophen 500 MG tablet Commonly known as: TYLENOL Take 1,000 mg by mouth every 6 (six) hours as needed for mild pain or headache.   albuterol 108 (90 Base) MCG/ACT inhaler Commonly known as: VENTOLIN HFA Inhale 2 puffs into the lungs every 6 (six) hours as needed for wheezing or shortness of breath.   digoxin 0.25 MG tablet Commonly known as: LANOXIN Take 1 tablet (0.25 mg total) by mouth daily. Start taking on: July 13, 2019   famotidine 20 MG tablet Commonly known as: PEPCID Take 1 tablet (20 mg total) by mouth daily. Start taking on: July 13, 2019   guaiFENesin 600 MG 12 hr tablet Commonly known as: MUCINEX Take 1 tablet (600 mg total) by mouth 2 (two) times daily.   hydrochlorothiazide 25 MG tablet Commonly known as: HYDRODIURIL Hold due to low BP What changed:   how much to take  how to take this  when to take this   methylPREDNISolone 4 MG Tbpk tablet Commonly known as: MEDROL DOSEPAK follow package directions   metoprolol tartrate 50 MG tablet Commonly known as: LOPRESSOR Take 1 tablet (50 mg total) by mouth 2 (two) times daily.   Rivaroxaban 15 MG Tabs tablet Commonly known as: XARELTO Take 1 tablet (15 mg total) by mouth 2 (two) times daily with a meal.            Durable Medical  Equipment  (From admission, onward)         Start     Ordered   07/12/19 1029  For home use only DME oxygen  Once    Question Answer Comment  Length of Need 6 Months   Mode or (Route) Nasal cannula   Liters per Minute 2   Frequency Continuous (stationary and portable oxygen unit needed)   Oxygen delivery system Gas      07/12/19 1028          Follow-up Information    Paraschos, Alexander, MD. Schedule an appointment as soon as possible for a visit in 2 week(s).   Specialty: Cardiology Contact information: [facility] [place] [address] [phone]        [name], MD. Schedule an appointment as soon as possible for a visit in 1 week(s).   Specialty: Internal Medicine Contact information: [address] [place] [address] [phone]           Major procedures and Radiology Reports - PLEASE review detailed and final reports thoroughly  -        CT Head Wo Contrast  Result Date: 06/30/2019 CLINICAL DATA:  Near syncope. EXAM: CT HEAD WITHOUT CONTRAST TECHNIQUE: Contiguous axial images were obtained from the base of the skull through the vertex without intravenous contrast. COMPARISON:  None. FINDINGS: Brain: No subdural, epidural, or subarachnoid hemorrhage. Ventricles and sulci are prominent but otherwise unremarkable. Cerebellum, brainstem, and basal cisterns are normal. White matter  changes are noted. No acute cortical ischemia or infarct identified. No mass effect or midline shift identified. Vascular: No hyperdense vessel or unexpected calcification. Skull: Normal. Negative for fracture or focal lesion. Sinuses/Orbits: No acute finding. Other: None. IMPRESSION: Chronic white matter changes.  No acute intracranial abnormalities. Electronically Signed   By: Dorise Bullion III M.D   On: 06/30/2019 17:38   DG CHEST PORT 1 VIEW  Result Date: 07/05/2019 CLINICAL DATA:   69 year old male positive COVID-19. Shortness of breath. EXAM: PORTABLE CHEST 1 VIEW COMPARISON:  06/30/2019 portable chest. FINDINGS: Portable AP semi upright view at 0622 hours. Lower lung volumes. Stable cardiomegaly and mediastinal contours. Increased bilateral pulmonary reticular interstitial opacities with lower long predominance. Decreased ventilation at the lung bases. No pneumothorax. No definite effusion. Visualized tracheal air column is within normal limits. No acute osseous abnormality identified. Negative visible bowel gas pattern. IMPRESSION: Lower lung volumes with decreased bibasilar ventilation. Suspected progression of COVID-19 pneumonia since 06/30/2019 in the form of increased bilateral reticular interstitial opacity. Electronically Signed   By: Genevie Ann M.D.   On: 07/05/2019 08:24   DG Chest Portable 1 View  Result Date: 06/30/2019 CLINICAL DATA:  Near syncopal episode. On antibiotics for cough. EXAM: PORTABLE CHEST 1 VIEW COMPARISON:  Abdominal CT 12/26/2014. No previous chest radiographs available. FINDINGS: 1643 hours. Stable mild cardiomegaly exaggerated by prominent epicardial fat as correlated with previous CT. There is patchy airspace disease in the left perihilar region which could reflect early pneumonia. The right lung appears clear. There is no edema, pneumothorax or significant pleural effusion. The bones appear unremarkable. Telemetry leads overlie the chest. IMPRESSION: 1. Patchy airspace disease in the left perihilar region suspicious for early pneumonia. Followup PA and lateral chest X-ray is recommended in 3-4 weeks following completion of antibiotic therapy to ensure resolution and exclude underlying malignancy. 2. Stable mild cardiomegaly exaggerated by prominent epicardial fat. Electronically Signed   By: Richardean Sale M.D.   On: 06/30/2019 17:13   LE Venous  Result Date: 07/09/2019  Lower Venous Study Indications: Covid-19, elevatd D-Dimer.  Comparison Study: No  prior study on file for comparison Performing Technologist: Sharion Dove RVS  Examination Guidelines: A complete evaluation includes B-mode imaging, spectral Doppler, color Doppler, and power Doppler as needed of all accessible portions of each vessel. Bilateral testing is considered an integral part of a complete examination. Limited examinations for reoccurring indications may be performed as noted.  +---------+---------------+---------+-----------+----------+--------------+ RIGHT    CompressibilityPhasicitySpontaneityPropertiesThrombus Aging +---------+---------------+---------+-----------+----------+--------------+ CFV      Full           Yes      Yes                                 +---------+---------------+---------+-----------+----------+--------------+ SFJ      Full                                                        +---------+---------------+---------+-----------+----------+--------------+ FV Prox  Full                                                        +---------+---------------+---------+-----------+----------+--------------+  FV Mid   Full                                                        +---------+---------------+---------+-----------+----------+--------------+ FV DistalFull                                                        +---------+---------------+---------+-----------+----------+--------------+ PFV      Full                                                        +---------+---------------+---------+-----------+----------+--------------+ POP      Partial        No       No                   Acute          +---------+---------------+---------+-----------+----------+--------------+ PTV      None                                         Acute          +---------+---------------+---------+-----------+----------+--------------+ PERO     None                                         Acute           +---------+---------------+---------+-----------+----------+--------------+   +---------+---------------+---------+-----------+----------+--------------+ LEFT     CompressibilityPhasicitySpontaneityPropertiesThrombus Aging +---------+---------------+---------+-----------+----------+--------------+ CFV      Full                                                        +---------+---------------+---------+-----------+----------+--------------+ SFJ      Full                                                        +---------+---------------+---------+-----------+----------+--------------+ FV Prox  Full                                                        +---------+---------------+---------+-----------+----------+--------------+ FV Mid   Full                                                        +---------+---------------+---------+-----------+----------+--------------+  FV DistalFull                                                        +---------+---------------+---------+-----------+----------+--------------+ PFV      Full                                                        +---------+---------------+---------+-----------+----------+--------------+ POP      Full           Yes      Yes                                 +---------+---------------+---------+-----------+----------+--------------+ PTV      None                                         Acute          +---------+---------------+---------+-----------+----------+--------------+ PERO     None                                         Acute          +---------+---------------+---------+-----------+----------+--------------+     Summary: Right: Findings consistent with acute deep vein thrombosis involving the right popliteal vein, right posterior tibial veins, and right peroneal veins. Left: Findings consistent with acute deep vein thrombosis involving the left posterior tibial veins, and left  peroneal veins.  *See table(s) above for measurements and observations. Electronically signed by Servando Snare MD on 07/09/2019 at 3:46:42 PM.    Final     Micro Results     Recent Results (from the past 240 hour(s))  Urine Culture     Status: None   Collection Time: 07/02/19  6:52 PM   Specimen: Urine, Random  Result Value Ref Range Status   Specimen Description   Final    URINE, RANDOM Performed at [facility], [address], [place], [place] [address]    Special Requests   Final    NONE Performed at [facility], [address], [place], [place] [address]    Culture   Final    NO GROWTH Performed at [facility], [place] [address], [place],  [address]    Report Status 07/04/2019 FINAL  Final    Today   Subjective    Dwana Curd today has no headache,no chest abdominal pain,no new weakness tingling or numbness, feels much better wants to go home today.     Objective   Blood pressure 110/63, pulse 90, temperature 97.6 F (36.4 C), temperature source Oral, resp. rate 20, height 5' 9"  (1.753 m), weight 124 kg, SpO2 96 %.   Intake/Output Summary (Last 24 hours) at 07/12/2019 1028 Last data filed at 07/12/2019 0900 Gross per 24 hour  Intake 480 ml  Output 1290 ml  Net -810 ml    Exam Awake Alert,   No new F.N deficits, Normal affect  [place].AT,PERRAL Supple Neck,No JVD, No cervical lymphadenopathy appriciated.  Symmetrical Chest wall movement, Good air movement bilaterally, CTAB RRR,No Gallops,Rubs or new Murmurs, No Parasternal Heave +ve B.Sounds, Abd Soft, Non tender, No organomegaly appriciated, No rebound -guarding or rigidity. No Cyanosis, Clubbing or edema, No new Rash or bruise   Data Review   CBC w Diff:  Lab Results  Component Value Date   WBC 16.4 (H) 07/11/2019   HGB 13.3 07/11/2019   HCT 43.2 07/11/2019   PLT 113 (L) 07/11/2019   LYMPHOPCT 12 07/09/2019   BANDSPCT 7 07/09/2019   MONOPCT 5 07/09/2019   EOSPCT 0  07/09/2019   BASOPCT 0 07/09/2019    CMP:  Lab Results  Component Value Date   NA 143 07/12/2019   K 4.0 07/12/2019   CL 103 07/12/2019   CO2 30 07/12/2019   BUN 14 07/12/2019   CREATININE 0.98 07/12/2019   PROT 6.1 (L) 07/09/2019   ALBUMIN 3.2 (L) 07/09/2019   BILITOT 1.4 (H) 07/09/2019   ALKPHOS 48 07/09/2019   AST 35 07/09/2019   ALT 51 (H) 07/09/2019  .   Total Time in preparing paper work, data evaluation and todays exam - 31 minutes  Lala Lund M.D on 07/12/2019 at 10:28 AM  [facility]   Office  [phone]

## 2019-07-18 DIAGNOSIS — I82431 Acute embolism and thrombosis of right popliteal vein: Secondary | ICD-10-CM | POA: Insufficient documentation

## 2019-07-18 DIAGNOSIS — Z8616 Personal history of COVID-19: Secondary | ICD-10-CM | POA: Insufficient documentation

## 2019-08-04 ENCOUNTER — Other Ambulatory Visit: Payer: Self-pay | Admitting: Internal Medicine

## 2019-08-25 ENCOUNTER — Encounter: Payer: Self-pay | Admitting: Emergency Medicine

## 2019-08-25 ENCOUNTER — Other Ambulatory Visit: Payer: Self-pay

## 2019-08-25 ENCOUNTER — Emergency Department
Admission: EM | Admit: 2019-08-25 | Discharge: 2019-08-25 | Disposition: A | Discharge status: Home / Self Care | Payer: Medicare HMO | Attending: Emergency Medicine | Admitting: Emergency Medicine

## 2019-08-25 DIAGNOSIS — R42 Dizziness and giddiness: Secondary | ICD-10-CM | POA: Insufficient documentation

## 2019-08-25 DIAGNOSIS — Z79899 Other long term (current) drug therapy: Secondary | ICD-10-CM | POA: Insufficient documentation

## 2019-08-25 DIAGNOSIS — Z7901 Long term (current) use of anticoagulants: Secondary | ICD-10-CM | POA: Diagnosis not present

## 2019-08-25 DIAGNOSIS — Z87891 Personal history of nicotine dependence: Secondary | ICD-10-CM | POA: Insufficient documentation

## 2019-08-25 LAB — CBC
HCT: 46.4 % (ref 39.0–52.0)
Hemoglobin: 15 g/dL (ref 13.0–17.0)
MCH: 29.4 pg (ref 26.0–34.0)
MCHC: 32.3 g/dL (ref 30.0–36.0)
MCV: 91 fL (ref 80.0–100.0)
Platelets: 102 10*3/uL — ABNORMAL LOW (ref 150–400)
RBC: 5.1 MIL/uL (ref 4.22–5.81)
RDW: 15.1 % (ref 11.5–15.5)
WBC: 4.2 10*3/uL (ref 4.0–10.5)
nRBC: 0 % (ref 0.0–0.2)

## 2019-08-25 LAB — DIFFERENTIAL
Abs Immature Granulocytes: 0.06 10*3/uL (ref 0.00–0.07)
Basophils Absolute: 0 10*3/uL (ref 0.0–0.1)
Basophils Relative: 1 %
Eosinophils Absolute: 0 10*3/uL (ref 0.0–0.5)
Eosinophils Relative: 1 %
Immature Granulocytes: 1 %
Lymphocytes Relative: 11 %
Lymphs Abs: 0.4 10*3/uL — ABNORMAL LOW (ref 0.7–4.0)
Monocytes Absolute: 1 10*3/uL (ref 0.1–1.0)
Monocytes Relative: 23 %
Neutro Abs: 2.7 10*3/uL (ref 1.7–7.7)
Neutrophils Relative %: 63 %

## 2019-08-25 LAB — PROTIME-INR
INR: 1.2 (ref 0.8–1.2)
Prothrombin Time: 15.5 seconds — ABNORMAL HIGH (ref 11.4–15.2)

## 2019-08-25 LAB — COMPREHENSIVE METABOLIC PANEL
ALT: 20 U/L (ref 0–44)
AST: 27 U/L (ref 15–41)
Albumin: 4.2 g/dL (ref 3.5–5.0)
Alkaline Phosphatase: 68 U/L (ref 38–126)
Anion gap: 8 (ref 5–15)
BUN: 14 mg/dL (ref 8–23)
CO2: 25 mmol/L (ref 22–32)
Calcium: 8.9 mg/dL (ref 8.9–10.3)
Chloride: 103 mmol/L (ref 98–111)
Creatinine, Ser: 1.08 mg/dL (ref 0.61–1.24)
GFR calc Af Amer: >60 mL/min (ref >60)
GFR calc non Af Amer: >60 mL/min (ref >60)
Glucose, Bld: 96 mg/dL (ref 70–99)
Potassium: 4.1 mmol/L (ref 3.5–5.1)
Sodium: 136 mmol/L (ref 135–145)
Total Bilirubin: 1.2 mg/dL (ref 0.3–1.2)
Total Protein: 7.4 g/dL (ref 6.5–8.1)

## 2019-08-25 LAB — APTT: aPTT: 34 seconds (ref 24–36)

## 2019-08-25 MED ORDER — SODIUM CHLORIDE 0.9% FLUSH
3.0000 mL | Freq: Once | INTRAVENOUS | Status: DC
Start: 2019-08-25 — End: 2019-08-25

## 2019-08-25 NOTE — ED Provider Notes (Signed)
[facility] Emergency Department Provider Note   ____________________________________________    I have reviewed the triage vital signs and the nursing notes.   HISTORY  Chief Complaint Dizziness     HPI Neil Bailey is a 69 y.o. male with history of atrial fibrillation on blood thinners who presents with complaints of dizziness.  Patient notes he had his first dose of coronavirus vaccine yesterday.  He has had COVID-19 prior to that.  He notes last night he began feeling mild lightheadedness, fatigue and weakness, similar to when he had coronavirus.  No shortness of breath.  No chest pain or palpitations.  Reports feeling somewhat improved today.  Past Medical History:  Diagnosis Date  . Atrial fibrillation ([place])   . DVT (deep venous thrombosis) [facility])     Patient Active Problem List   Diagnosis Date Noted  . Acute respiratory failure with hypoxia ([place]) 07/04/2019  . Atrial fibrillation, chronic ([place]) 07/04/2019  . Pneumonia due to COVID-19 virus 07/01/2019  . CAP (community acquired pneumonia) 06/30/2019    Past Surgical History:  Procedure Laterality Date  . COLONOSCOPY  7-8 years ago   [facility]  . EYE SURGERY  05/2014, 06/2014   cataracts  . INGUINAL HERNIA REPAIR Right 01/19/2015   Procedure: RIGHT INCARCERATED INGUINAL HERNIA REPAIR;  Surgeon: Christene Lye, MD;  Location: [facility] ORS;  Service: General;  Laterality: Right;  . TONSILLECTOMY      Prior to Admission medications   Medication Sig Start Date End Date Taking? Authorizing Provider  acetaminophen (TYLENOL) 500 MG tablet Take 1,000 mg by mouth every 6 (six) hours as needed for mild pain or headache.    [provider]  albuterol (VENTOLIN HFA) 108 (90 Base) MCG/ACT inhaler Inhale 2 puffs into the lungs every 6 (six) hours as needed for wheezing or shortness of breath. 07/12/19   Thurnell Lose, MD  digoxin (LANOXIN) 0.25 MG tablet Take 1 tablet (0.25 mg total)  by mouth daily. 07/13/19   Thurnell Lose, MD  famotidine (PEPCID) 20 MG tablet Take 1 tablet (20 mg total) by mouth daily. 07/13/19   Thurnell Lose, MD  guaiFENesin (MUCINEX) 600 MG 12 hr tablet Take 1 tablet (600 mg total) by mouth 2 (two) times daily. 07/04/19   Enzo Bi, MD  hydrochlorothiazide (HYDRODIURIL) 25 MG tablet Hold due to low BP Patient taking differently: Take 25 mg by mouth daily. Hold due to low BP 07/04/19   Enzo Bi, MD  methylPREDNISolone (MEDROL DOSEPAK) 4 MG TBPK tablet follow package directions 07/12/19   Thurnell Lose, MD  metoprolol tartrate (LOPRESSOR) 50 MG tablet Take 1 tablet (50 mg total) by mouth 2 (two) times daily. 07/12/19   Thurnell Lose, MD  Rivaroxaban (XARELTO) 15 MG TABS tablet Take 1 tablet (15 mg total) by mouth 2 (two) times daily with a meal. 07/12/19   Thurnell Lose, MD     Allergies Plasma, human  No family history on file.  Social History Social History   Tobacco Use  . Smoking status: Former Smoker    Years: 28.00    Quit date: 07/11/1986    Years since quitting: 33.1  . Smokeless tobacco: Never Used  Substance Use Topics  . Alcohol use: Yes    Alcohol/week: 0.0 standard drinks    Comment: occ  . Drug use: No    Review of Systems  Constitutional: No fever/chills Eyes: No visual changes.  ENT: No sore throat. Cardiovascular: As  above Respiratory: Denies shortness of breath. Gastrointestinal: No abdominal pain.  No nausea, no vomiting.   Genitourinary: Negative for dysuria. Musculoskeletal: Negative for back pain. Skin: Negative for rash. Neurological: Negative for headaches   ____________________________________________   PHYSICAL EXAM:  VITAL SIGNS: ED Triage Vitals  Enc Vitals Group     BP 08/25/19 1336 126/72     Pulse Rate 08/25/19 1336 97     Resp 08/25/19 1336 16     Temp 08/25/19 1336 98 F (36.7 C)     Temp Source 08/25/19 1336 Oral     SpO2 08/25/19 1336 98 %     Weight 08/25/19 1337 113.4 kg  (250 lb)     Height 08/25/19 1337 1.753 m (5\' 9" )     Head Circumference --      Peak Flow --      Pain Score 08/25/19 1337 0     Pain Loc --      Pain Edu? --      Excl. in [place]? --     Constitutional: Alert and oriented.  Nose: No congestion/rhinnorhea. Mouth/Throat: Mucous membranes are moist.    Cardiovascular: Normal rate, irregular rhythm. Grossly normal heart sounds.  Good peripheral circulation. Respiratory: Normal respiratory effort.  No retractions. Lungs CTAB. Gastrointestinal: Soft and nontender. No distention.   Musculoskeletal:   Warm and well perfused Neurologic:  Normal speech and language. No gross focal neurologic deficits are appreciated.  Skin:  Skin is warm, dry and intact. No rash noted. Psychiatric: Mood and affect are normal. Speech and behavior are normal.  ____________________________________________   LABS (all labs ordered are listed, but only abnormal results are displayed)  Labs Reviewed  PROTIME-INR - Abnormal; Notable for the following components:      Result Value   Prothrombin Time 15.5 (*)    All other components within normal limits  CBC - Abnormal; Notable for the following components:   Platelets 102 (*)    All other components within normal limits  DIFFERENTIAL - Abnormal; Notable for the following components:   Lymphs Abs 0.4 (*)    All other components within normal limits  APTT  COMPREHENSIVE METABOLIC PANEL  I-STAT CREATININE, ED  CBG MONITORING, ED   ____________________________________________  EKG  ED ECG REPORT I, Lavonia Drafts, the attending physician, personally viewed and interpreted this ECG.  Date: 08/25/2019  Rhythm: Atrial fibrillation QRS Axis: normal Intervals: Abnormal ST/T Wave abnormalities: normal Narrative Interpretation: no evidence of acute ischemia  ____________________________________________  RADIOLOGY  None ____________________________________________   PROCEDURES  Procedure(s)  performed: No  Procedures   Critical Care performed: No ____________________________________________   INITIAL IMPRESSION / ASSESSMENT AND PLAN / ED COURSE  Pertinent labs & imaging results that were available during my care of the patient were reviewed by me and considered in my medical decision making (see chart for details).  Patient presents with complaints of mild dizziness, he is well-appearing here in the emergency department, with normal blood pressure.  EKG is unremarkable, lab work is significant only for  thrombocytopenia which appears to be chronic for the patient.  I suspect he is having an immune reaction related to having Covid in the past and having antibodies now having initial Covid vaccine.  Recommend rest, hydration with anticipate improvement within the next 24 to 48 hours.  Return precautions discussed.   ____________________________________________   FINAL CLINICAL IMPRESSION(S) / ED DIAGNOSES  Final diagnoses:  Dizziness        Note:  This document was  prepared using Systems analyst and may include unintentional dictation errors.   Lavonia Drafts, MD 08/25/19 [phone]

## 2019-08-25 NOTE — ED Triage Notes (Signed)
Pt to ED via POV c/o dizziness since last night around 9pm. Pt denies any other symptoms. Pt state that he got his first COVID vaccine yesterday. Pt describes the dizziness as a lightheaded feeling. Pt denies N/V.

## 2019-08-25 NOTE — ED Notes (Signed)
This RN at bedside to discharge pt. Pt was here for dizziness after covid vaccine.  Alert and oriented. NAD.  Color WNL.  No vision changes. No facial droop.  Denies any symptoms other than dizziness. No numbness or tingling.

## 2019-08-25 NOTE — Discharge Instructions (Signed)
I suspect your symptoms are related to an immune response to the coronavirus vaccine given that you have already had novel coronavirus.  Your lab tests today are reassuring, your EKG is unchanged from prior.  Your blood pressure and heart rate are normal.  Please focus on staying hydrated and resting I would anticipate that you will feel better within the next 24 to 48 hours

## 2020-02-11 DIAGNOSIS — D696 Thrombocytopenia, unspecified: Secondary | ICD-10-CM | POA: Insufficient documentation

## 2020-02-20 ENCOUNTER — Other Ambulatory Visit: Payer: Self-pay

## 2020-02-20 ENCOUNTER — Encounter: Payer: Self-pay | Admitting: Urology

## 2020-02-20 ENCOUNTER — Ambulatory Visit: Payer: Medicare HMO | Admitting: Urology

## 2020-02-20 VITALS — BP 105/70 | HR 91 | Ht 69.0 in | Wt 285.0 lb

## 2020-02-20 DIAGNOSIS — N5089 Other specified disorders of the male genital organs: Secondary | ICD-10-CM | POA: Diagnosis not present

## 2020-02-20 DIAGNOSIS — N3289 Other specified disorders of bladder: Secondary | ICD-10-CM

## 2020-02-20 NOTE — Progress Notes (Signed)
02/20/20 4:33 PM   Neil Bailey 08-09-1950 [id number]  CC: Scrotal swelling  HPI: I saw Neil Bailey in urology clinic for scrotal swelling.  He is a 69 year old male with past medical history notable for obesity with a BMI of 42, history of atrial fibrillation and DVT on Coumadin who presents with worsening scrotal swelling over the last few years.  The swelling is worse on the left side, and it does seem to get smaller after he urinates.  He occasionally will have some urgency and frequency with urination as well.  His history is notable for a open right inguinal direct hernia repair with Dr. 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  From the operative note, it appears the posterior wall was reinforced with a progrip mesh.  He denies any gross hematuria or abdominal pain.  A scrotal ultrasound performed in 2017 showed a right hydrocele, but no left-sided pathology.  PMH: Past Medical History:  Diagnosis Date  . Atrial fibrillation ([place])   . DVT (deep venous thrombosis) [facility])     Surgical History: Past Surgical History:  Procedure Laterality Date  . COLONOSCOPY  7-8 years ago   [facility]  . EYE SURGERY  05/2014, 06/2014   cataracts  . INGUINAL HERNIA REPAIR Right 01/19/2015   Procedure: RIGHT INCARCERATED INGUINAL HERNIA REPAIR;  Surgeon: Christene Lye, MD;  Location: [facility] ORS;  Service: General;  Laterality: Right;  . TONSILLECTOMY      Social History:  reports that he quit smoking about 33 years ago. He quit after 28.00 years of use. He has never used smokeless tobacco. He reports current alcohol use. He reports that he does not use drugs.  Physical Exam: BP 105/70   Pulse 91   Ht 5\' 9"  (1.753 m)   Wt 285 lb (129.3 kg)   BMI 42.09 kg/m    Constitutional:  Alert and oriented, No acute distress. Cardiovascular: No clubbing, cyanosis, or edema. Respiratory: Normal respiratory effort, no increased  work of breathing. GI: Abdomen is soft, nontender, nondistended, no abdominal masses GU: Bilateral scrotal swelling, left more than right.  Circumcised phallus with patent meatus.  Suspected right sided hydrocele, right testicle without masses.  Left significant scrotal swelling that projects up into the left groin worrisome for possible herniation of bladder, bowel, versus large hydrocele  Laboratory Data: Reviewed  Pertinent Imaging: I have personally reviewed the CT from June 2016 showing herniation of the bladder into the right scrotum  Assessment & Plan:   In summary, is a 69 year old male with a history of right direct inguinal hernia repair by Dr. Jamal Collin when the bladder had herniated into the right scrotum, and mesh was placed.  He has had a recurrence of symptoms on the left side, and I am concerned that he has herniated the bladder into the left scrotum now.  We discussed other possible etiologies including a hernia with bowel versus a hydrocele.  I recommended further imaging with a CT abdomen pelvis, and further treatment strategies pending those results.  We discussed that a possible joint case with general surgery may be needed if the bladder has herniated significantly into the left scrotum.  I do anticipate his urinary symptoms will improve if this is the case by returning the bladder to its anatomic location in the pelvis.   Virtual visit in 2 to 3 weeks to discuss CT results  [name], MD 02/20/2020  [place]  [address], [place], [place] [address] [phone]

## 2020-03-09 ENCOUNTER — Other Ambulatory Visit: Payer: Self-pay

## 2020-03-09 ENCOUNTER — Ambulatory Visit
Admission: RE | Admit: 2020-03-09 | Discharge: 2020-03-09 | Disposition: A | Discharge status: Home / Self Care | Payer: Medicare HMO | Source: Ambulatory Visit | Attending: Urology | Admitting: Urology

## 2020-03-09 DIAGNOSIS — K4090 Unilateral inguinal hernia, without obstruction or gangrene, not specified as recurrent: Secondary | ICD-10-CM | POA: Diagnosis not present

## 2020-03-09 DIAGNOSIS — N5089 Other specified disorders of the male genital organs: Secondary | ICD-10-CM | POA: Diagnosis present

## 2020-03-09 DIAGNOSIS — I700 Atherosclerosis of aorta: Secondary | ICD-10-CM

## 2020-03-09 HISTORY — DX: Atherosclerosis of aorta: I70.0

## 2020-03-09 IMAGING — CT CT ABD-PELV W/O CM
2 of 7 series · 14 of 46 positions shown, 18 images · non-contrast
Comparison: [DATE]

CLINICAL DATA: Left inguinal hernia. Suspect bladder herniation.
Surgical planning.

EXAM:
CT ABDOMEN AND PELVIS WITHOUT CONTRAST
TECHNIQUE: Multidetector CT imaging of the abdomen and pelvis was performed
following the standard protocol without IV contrast.

[Series 3: axials routine abdomen pelvis without 5.00 · axial · non-contrast · 0.98mm/px · z∈[-1703,-1148]mm · 11 of 132 slices shown, 15 images]
[im 14/132  soft-tissue]
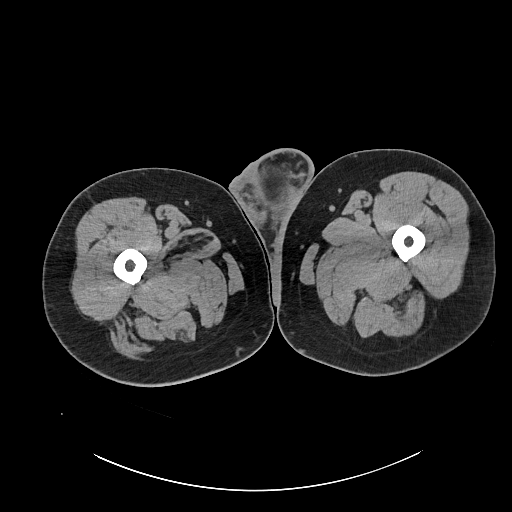
[im 14/132  bone]
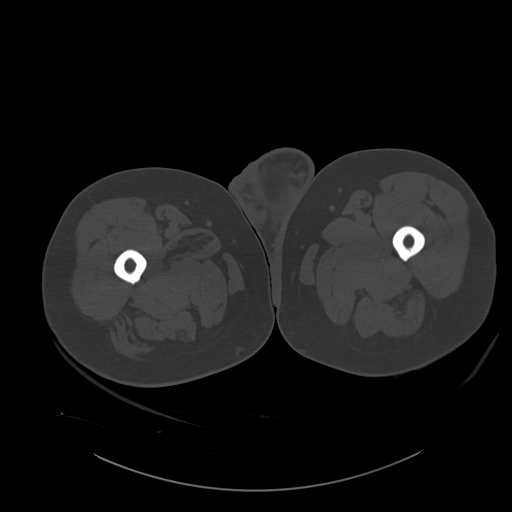
[im 28/132  soft-tissue]
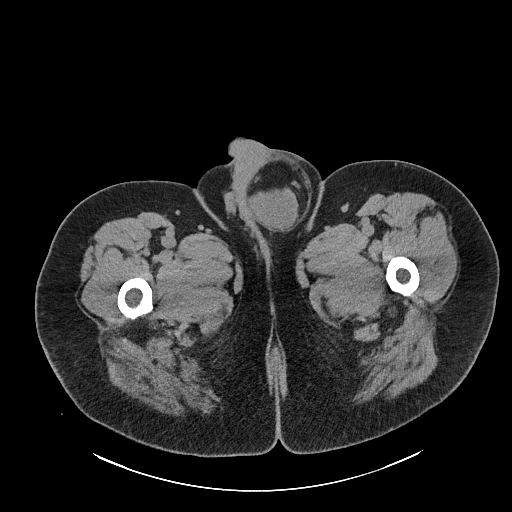
[im 42/132  soft-tissue]
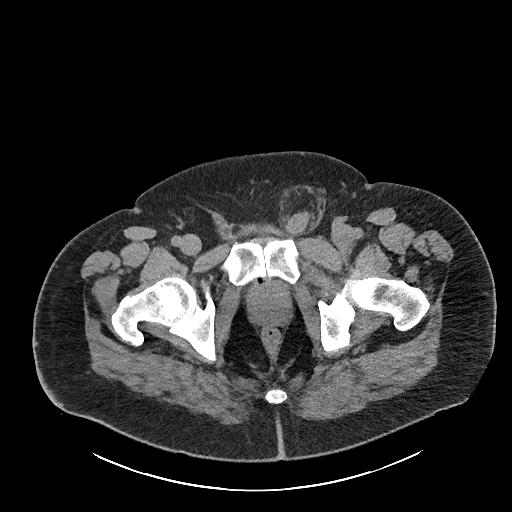
[im 56/132  soft-tissue]
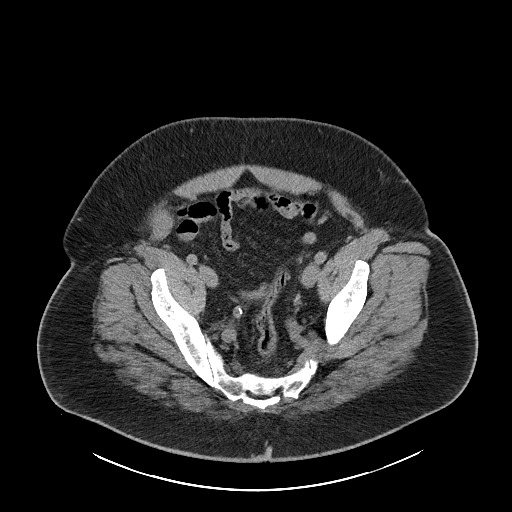
[im 69/132  soft-tissue]
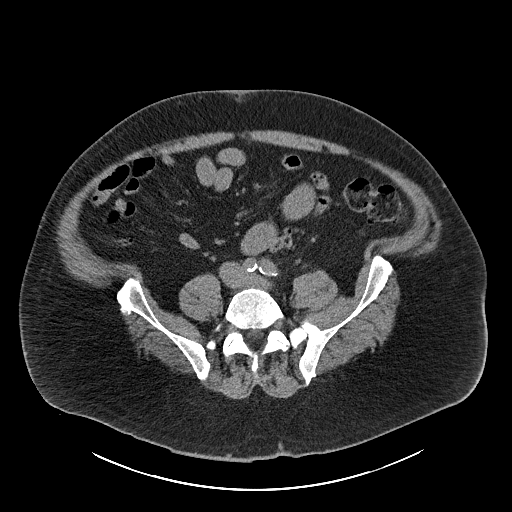
[im 83/132  soft-tissue]
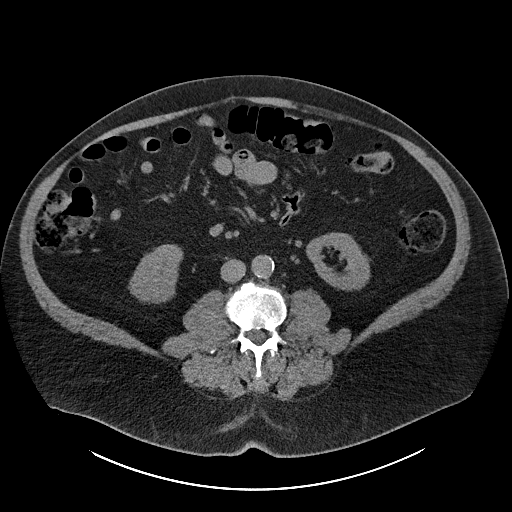
[im 97/132  soft-tissue]
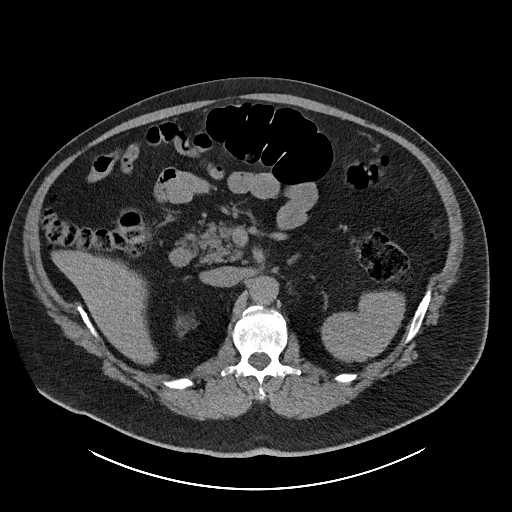
[im 104/132  lung]
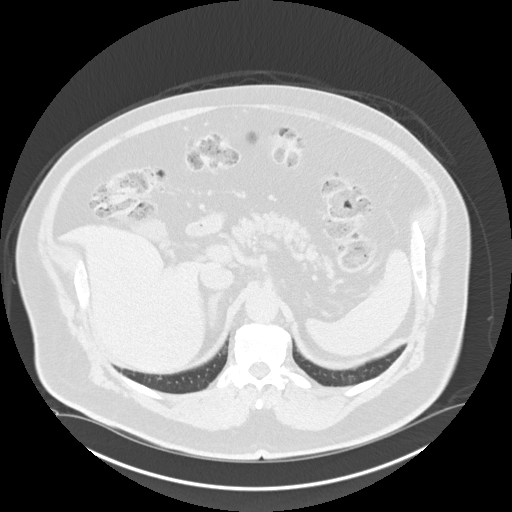
[im 111/132  soft-tissue]
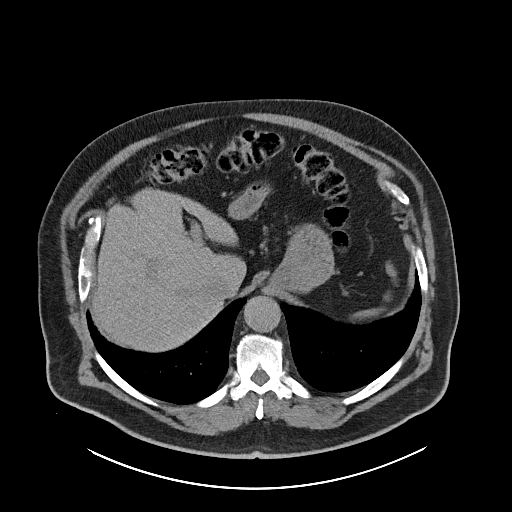
[im 111/132  lung]
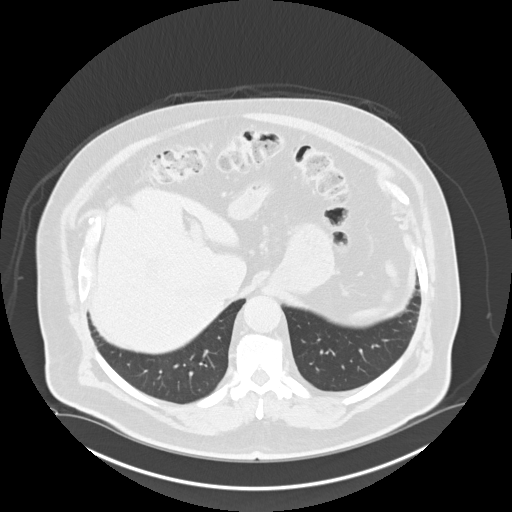
[im 118/132  lung]
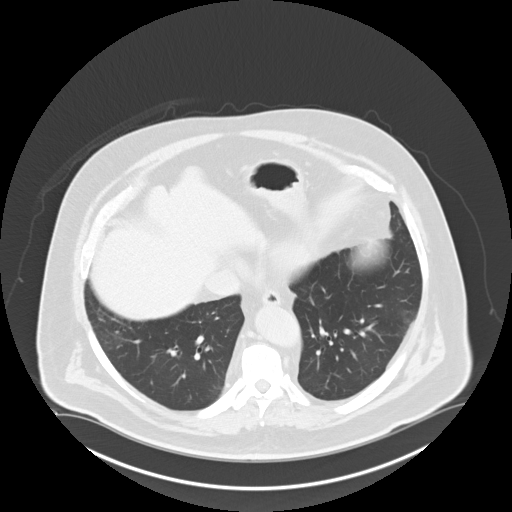
[im 125/132  soft-tissue]
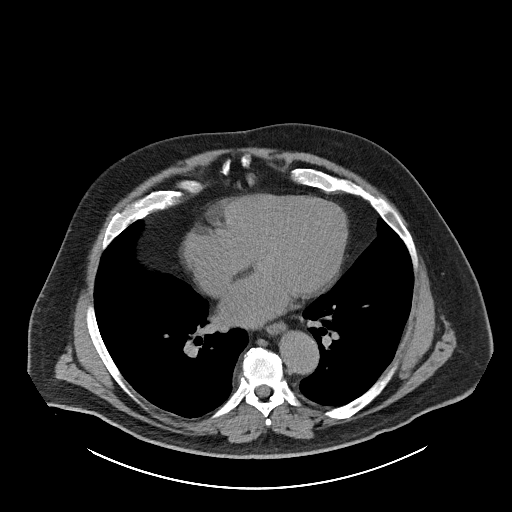
[im 125/132  lung]
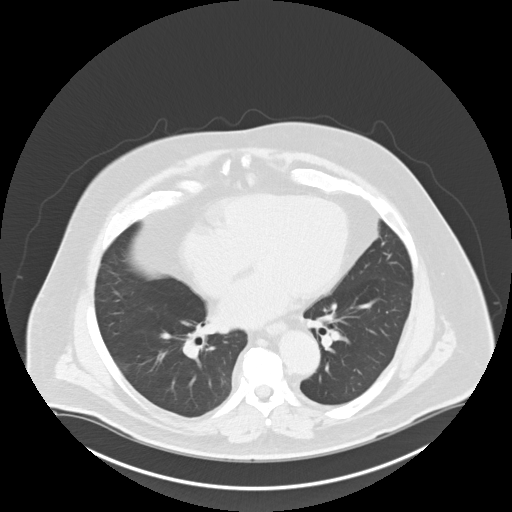
[im 125/132  bone]
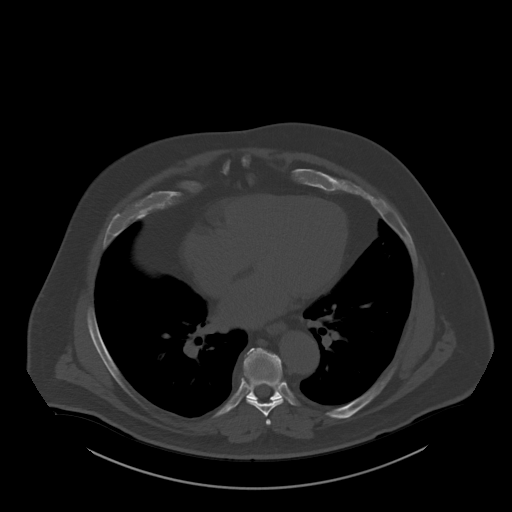

[Series 5: coronals routine abdomen pelvis without 2.00 cor · coronal · non-contrast · 0.97mm/px · 3 of 192 slices shown]
[im 48/192  soft-tissue]
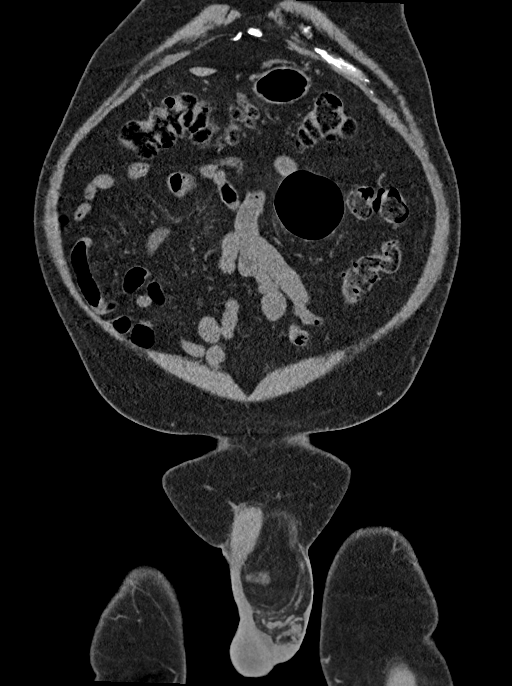
[im 96/192  soft-tissue]
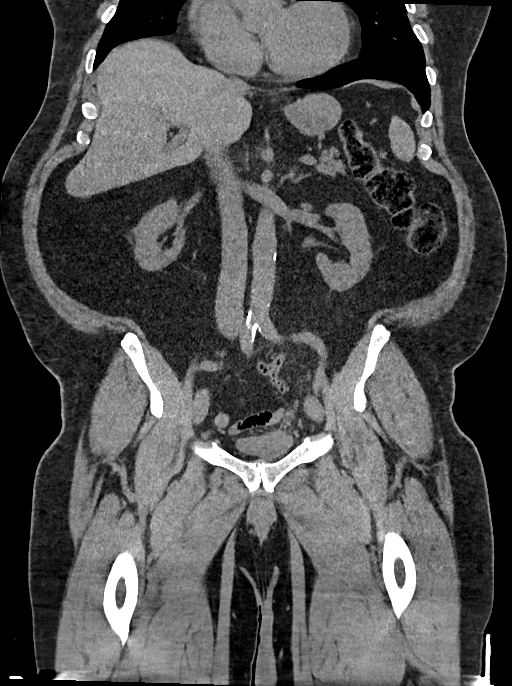
[im 144/192  soft-tissue]
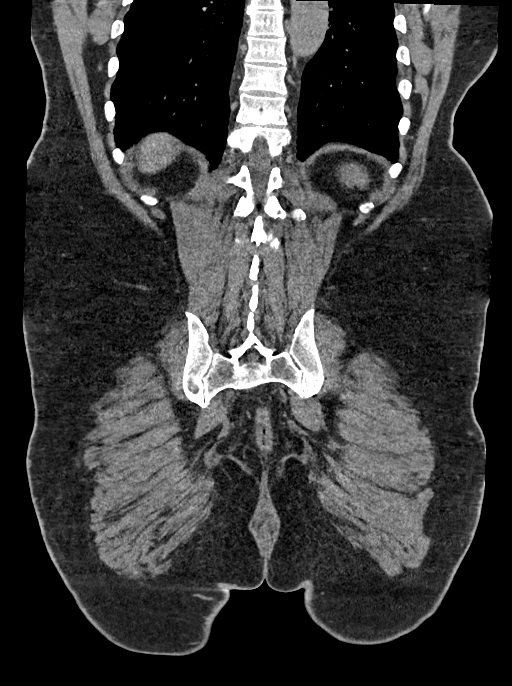

[14 of 46 positions shown; findings below may reference images not displayed]

FINDINGS: Lower chest: No acute abnormality.

Hepatobiliary: No focal hepatic abnormality. Gallbladder
unremarkable.

Pancreas: No focal abnormality or ductal dilatation.

Spleen: No focal abnormality.  Normal size.

Adrenals/Urinary Tract: Enlarging low-density area within the
midpole of the right kidney measures 3.5 cm compared to 1.5 cm
previously. This cannot be characterized on this noncontrast study.
No hydronephrosis. Adrenal glands are unremarkable. Urinary bladder
is decompressed. A portion of the urinary bladder is seen within a
large left inguinal hernia.

Stomach/Bowel: Scattered colonic diverticulosis. No active
diverticulitis. Normal appendix. Stomach and small bowel
decompressed, grossly unremarkable. No evidence of bowel
obstruction.

Vascular/Lymphatic: Aortic atherosclerosis. No evidence of aneurysm
or adenopathy.

Reproductive: No visible focal abnormality.

Other: No free fluid or free air. Large left inguinal hernia
containing fat and a portion of the urinary bladder.

Musculoskeletal: No acute bony abnormality.
IMPRESSION: Large left inguinal hernia contains fat and a portion of the urinary
bladder.

Scattered colonic diverticulosis.  No active diverticulitis.

3.5 cm low-density lesion in the midpole of the right kidney cannot
be characterized on this noncontrast study. Recommend further
evaluation with renal ultrasound or MRI.

Aortic atherosclerosis.

## 2020-03-10 ENCOUNTER — Other Ambulatory Visit: Payer: Self-pay | Admitting: Urology

## 2020-03-10 DIAGNOSIS — K4090 Unilateral inguinal hernia, without obstruction or gangrene, not specified as recurrent: Secondary | ICD-10-CM

## 2020-03-11 ENCOUNTER — Other Ambulatory Visit: Payer: Self-pay

## 2020-03-11 ENCOUNTER — Ambulatory Visit: Payer: Medicare HMO | Admitting: Urology

## 2020-03-11 ENCOUNTER — Encounter: Payer: Self-pay | Admitting: Urology

## 2020-03-11 VITALS — BP 147/78 | HR 74 | Ht 69.0 in | Wt 282.6 lb

## 2020-03-11 DIAGNOSIS — K4090 Unilateral inguinal hernia, without obstruction or gangrene, not specified as recurrent: Secondary | ICD-10-CM

## 2020-03-11 NOTE — Progress Notes (Signed)
   03/11/2020 11:27 AM   Dwana Curd [birth year] [id number]  Reason for visit: Follow up urinary symptoms, review CT results  HPI: I saw Mr. Yeske back in urology clinic for the above issues.  Briefly, he is a 69 year old male with obesity and BMI of 42, history of atrial fibrillation and DVT on Coumadin who said worsening left scrotal swelling over the last few years.  He is also had some urinary symptoms of urgency and frequency.  History notable for open right inguinal direct hernia repair with Dr. Jamal Collin in July 2016 at which time the bladder was herniated into the right scrotum.  Mesh was placed on the right side at that point.  At our last visit, I ordered a CT for further evaluation of his left scrotal swelling and suspected hernia with herniation of the bladder.  I personally reviewed his CT dated 03/09/2020 which does show a left-sided hernia with significant herniation of the majority of the bladder into the left scrotum.  There is also a 3.5 cm right renal cyst and further evaluation was recommended with a renal ultrasound or contrasted scan.  We reviewed these results at length.  We discussed the need for surgical repair with general surgery and possible urology involvement.  I would defer details of the exact procedure and approach robotic/lap/open to general surgery.  Urology is happy to assist as needed for returning the bladder to the pelvis.  I discussed his case with Dr. Dahlia Byes of general surgery, and he has an appointment next week to discuss left hernia repair with majority of the bladder herniated into the left scrotum.  Anticipate this will significantly improve his urinary symptoms, as well as the discomfort of his left-sided hernia.  Call with renal US results Referral placed to general surgery for evaluation of left inguinal hernia with bladder herniation  [name], MD  [facility] [address], [place], [place]  [address] [phone]

## 2020-03-18 ENCOUNTER — Other Ambulatory Visit: Payer: Self-pay

## 2020-03-18 ENCOUNTER — Encounter: Payer: Self-pay | Admitting: Surgery

## 2020-03-18 ENCOUNTER — Telehealth: Payer: Self-pay

## 2020-03-18 ENCOUNTER — Ambulatory Visit (INDEPENDENT_AMBULATORY_CARE_PROVIDER_SITE_OTHER): Payer: Medicare HMO | Admitting: Surgery

## 2020-03-18 VITALS — BP 107/72 | HR 61 | Temp 98.2°F | Ht 69.0 in | Wt 280.6 lb

## 2020-03-18 DIAGNOSIS — K4090 Unilateral inguinal hernia, without obstruction or gangrene, not specified as recurrent: Secondary | ICD-10-CM

## 2020-03-18 NOTE — Progress Notes (Signed)
Cardiac Clearance has been received from Dr Saralyn Pilar office. The patient may hold his Warfarin 5 days prior to surgery. He is cleared at Low risk for surgery.

## 2020-03-18 NOTE — Progress Notes (Addendum)
Surgical Consultation  03/18/2020  Neil Bailey is an 69 y.o. male.   Chief Complaint  Patient presents with  . New Patient (Initial Visit)     last seen 2016 [facility] ref Dr.Sninksy Left inguinal hernia with herniation of bladder into scrotum    HPI: Neil Bailey is a 69 year old patient seen in consultation at the request of Dr. Diamantina Providence for a symptomatic L inguinal hernia.  He reports left scrotal swelling for a few months.  Recently went to urology was urinary symptoms including urgency and frequency.  He does have a history significant for A. fib, DVT and is on Coumadin.  His BMI is 42.  He also had CT scan that have personally reviewed showing evidence of a left inguinal hernia with bladder inside the hernia sac.  There is no evidence of incarceration or strangulation. Hx of Right open IH w mesh by Dr. Jamal Collin 2016.  He endorses left inguinal discomfort but not true pain. He is able to perform more than 4 METS of activity w/o SOB or C/P. CBC nml except PL 102k, CMP normal. He sees Dr. Saralyn Pilar for a fib and anticoagulation  Past Medical History:  Diagnosis Date  . Atrial fibrillation ([place])   . DVT (deep venous thrombosis) ([place])     Past Surgical History:  Procedure Laterality Date  . COLONOSCOPY  7-8 years ago   [facility]  . EYE SURGERY  05/2014, 06/2014   cataracts  . INGUINAL HERNIA REPAIR Right 01/19/2015   Procedure: RIGHT INCARCERATED INGUINAL HERNIA REPAIR;  Surgeon: Christene Lye, MD;  Location: [facility] ORS;  Service: General;  Laterality: Right;  . TONSILLECTOMY      History reviewed. No pertinent family history.  Social History:  reports that he quit smoking about 33 years ago. He quit after 28.00 years of use. He has never used smokeless tobacco. He reports current alcohol use. He reports that he does not use drugs.  Allergies:  Allergies  Allergen Reactions  . Plasma, Human Shortness Of Breath, Anxiety, Other (See Comments), Cough and Hypertension     Redness    Medications reviewed.     ROS Full ROS performed and is otherwise negative other than what is stated in the HPI    BP 107/72   Pulse 61   Temp 98.2 F (36.8 C) (Oral)   Ht 5\' 9"  (1.753 m)   Wt 280 lb 9.6 oz (127.3 kg)   SpO2 96%   BMI 41.44 kg/m   Physical Exam Vitals and nursing note reviewed. Exam conducted with a chaperone present.  Constitutional:      General: He is not in acute distress.    Appearance: Normal appearance. He is obese.  Eyes:     General: No scleral icterus.       Right eye: No discharge.        Left eye: No discharge.  Cardiovascular:     Rate and Rhythm: Normal rate. Rhythm irregular.     Heart sounds: No murmur heard.   Pulmonary:     Effort: Pulmonary effort is normal. No respiratory distress.     Breath sounds: Normal breath sounds. No stridor. No wheezing.  Abdominal:     General: Abdomen is flat. There is no distension.     Palpations: Abdomen is soft. There is no mass.     Tenderness: There is no abdominal tenderness. There is no guarding or rebound.     Hernia: A hernia is present.     Comments: Reducible  LIH with some discomfort on palpation.large left hydrocele.  Musculoskeletal:        General: No swelling or tenderness. Normal range of motion.     Cervical back: Normal range of motion and neck supple. No rigidity or tenderness.  Skin:    General: Skin is warm and dry.     Capillary Refill: Capillary refill takes less than 2 seconds.     Comments: He does have intertrigo on left inner thigh  Neurological:     General: No focal deficit present.     Mental Status: He is alert and oriented to person, place, and time.  Psychiatric:        Mood and Affect: Mood normal.        Behavior: Behavior normal.        Thought Content: Thought content normal.        Judgment: Judgment normal.      Assessment/Plan: 69 year old male with a symptomatic left hernia.  He is currently anticoagulated,  hernia contains bladder.  He  had a history of an open right inguinal hernia repair with mesh.  I definitely recommend repair of the left inguinal hernia and I do think robotic approach would be in his best interest.  Given that he has bladder within the hernia sac , we will get urology involved in the case so they can perform catheterization with ICG injection into the left ureter.  That way I will be able to identify and preserve the ureter and bladder during the operation.  Procedure discussed with patient detail.  Risk, benefits , and possible complications including but not limited to: Bleeding, chronic pain, bladder injury, recurrence, infection  injury to adjacent organs.  We will make sure that cardiology Dr. Saralyn Pilar is okay with holding Coumadin or they may have to bridge him with Lovenox depending on their recommendations.  Extensive counseling provided. D/w him about keeping perineum and thighs dry to prevent candida infection of intertrigo. He verbalized understanding. Case d/w Dr. Diamantina Providence in detail.   Caroleen Hamman, MD [facility] General Surgeon

## 2020-03-18 NOTE — Telephone Encounter (Signed)
Cardiac Clearance was faxed over to Dr.Paraschos's office at [phone].

## 2020-03-18 NOTE — Patient Instructions (Addendum)
Our surgery scheduler Pamala Hurry will contact you within 24-48 hours to discuss preparation prior to surgery. Please have the BLUE sheet available when she contacts you to get scheduled. If you have any questions regarding surgery, please do not hesitate to give our office a call.  Please follow Cardiologist recommendations medication protocol prior to surgery. Cardiac Clearance was sent at today's visit.   Inguinal Hernia, Adult An inguinal hernia is when fat or your intestines push through a weak spot in a muscle where your leg meets your lower belly (groin). This causes a rounded lump (bulge). This kind of hernia could also be:  In your scrotum, if you are male.  In folds of skin around your vagina, if you are male. There are three types of inguinal hernias. These include:  Hernias that can be pushed back into the belly (are reducible). This type rarely causes pain.  Hernias that cannot be pushed back into the belly (are incarcerated).  Hernias that cannot be pushed back into the belly and lose their blood supply (are strangulated). This type needs emergency surgery. If you do not have symptoms, you may not need treatment. If you have symptoms or a large hernia, you may need surgery. Follow these instructions at home: Lifestyle  Do these things if told by your doctor so you do not have trouble pooping (constipation): ? Drink enough fluid to keep your pee (urine) pale yellow. ? Eat foods that have a lot of fiber. These include fresh fruits and vegetables, whole grains, and beans. ? Limit foods that are high in fat and processed sugars. These include foods that are fried or sweet. ? Take medicine for trouble pooping.  Avoid lifting heavy objects.  Avoid standing for long amounts of time.  Do not use any products that contain nicotine or tobacco. These include cigarettes and e-cigarettes. If you need help quitting, ask your doctor.  Stay at a healthy weight. General  instructions  You may try to push your hernia in by very gently pressing on it when you are lying down. Do not try to force the bulge back in if it will not push in easily.  Watch your hernia for any changes in shape, size, or color. Tell your doctor if you see any changes.  Take over-the-counter and prescription medicines only as told by your doctor.  Keep all follow-up visits as told by your doctor. This is important. Contact a doctor if:  You have a fever.  You have new symptoms.  Your symptoms get worse. Get help right away if:  The area where your leg meets your lower belly has: ? Pain that gets worse suddenly. ? A bulge that gets bigger suddenly, and it does not get smaller after that. ? A bulge that turns red or purple. ? A bulge that is painful when you touch it.  You are a man, and your scrotum: ? Suddenly feels painful. ? Suddenly changes in size.  You cannot push the hernia in by very gently pressing on it when you are lying down. Do not try to force the bulge back in if it will not push in easily.  You feel sick to your stomach (nauseous), and that feeling does not go away.  You throw up (vomit), and that keeps happening.  You have a fast heartbeat.  You cannot poop (have a bowel movement) or pass gas. These symptoms may be an emergency. Do not wait to see if the symptoms will go away. Get medical help  right away. Call your local emergency services (911 in the [place] Summary  An inguinal hernia is when fat or your intestines push through a weak spot in a muscle where your leg meets your lower belly (groin). This causes a rounded lump (bulge).  If you do not have symptoms, you may not need treatment. If you have symptoms or a large hernia, you may need surgery.  Avoid lifting heavy objects. Also avoid standing for long amounts of time.  Do not try to force the bulge back in if it will not push in easily. This information is not intended to replace advice  given to you by your health care provider. Make sure you discuss any questions you have with your health care provider. Document Revised: 07/29/2017 Document Reviewed: 03/29/2017 Elsevier Patient Education  [employer].

## 2020-03-18 NOTE — H&P (View-Only) (Signed)
Surgical Consultation  03/18/2020  Neil Bailey is an 69 y.o. male.   Chief Complaint  Patient presents with  . New Patient (Initial Visit)     last seen 2016 [facility] ref Dr.Sninksy Left inguinal hernia with herniation of bladder into scrotum    HPI: Neil Bailey is a 69 year old patient seen in consultation at the request of Dr. Diamantina Providence for a symptomatic L inguinal hernia.  Neil Bailey reports left scrotal swelling for a few months.  Recently went to urology was urinary symptoms including urgency and frequency.  Neil Bailey does have a history significant for A. fib, DVT and is on Coumadin.  His BMI is 42.  Neil Bailey also had CT scan that have personally reviewed showing evidence of a left inguinal hernia with bladder inside the hernia sac.  There is no evidence of incarceration or strangulation. Hx of Right open IH w mesh by Dr. Jamal Collin 2016.  Neil Bailey endorses left inguinal discomfort but not true pain. Neil Bailey is able to perform more than 4 METS of activity w/o SOB or C/P. CBC nml except PL 102k, CMP normal. Neil Bailey sees Dr. Saralyn Pilar for a fib and anticoagulation  Past Medical History:  Diagnosis Date  . Atrial fibrillation ([place])   . DVT (deep venous thrombosis) ([place])     Past Surgical History:  Procedure Laterality Date  . COLONOSCOPY  7-8 years ago   [facility]  . EYE SURGERY  05/2014, 06/2014   cataracts  . INGUINAL HERNIA REPAIR Right 01/19/2015   Procedure: RIGHT INCARCERATED INGUINAL HERNIA REPAIR;  Surgeon: Christene Lye, MD;  Location: [facility] ORS;  Service: General;  Laterality: Right;  . TONSILLECTOMY      History reviewed. No pertinent family history.  Social History:  reports that Neil Bailey quit smoking about 33 years ago. Neil Bailey quit after 28.00 years of use. Neil Bailey has never used smokeless tobacco. Neil Bailey reports current alcohol use. Neil Bailey reports that Neil Bailey does not use drugs.  Allergies:  Allergies  Allergen Reactions  . Plasma, Human Shortness Of Breath, Anxiety, Other (See Comments), Cough and Hypertension     Redness    Medications reviewed.     ROS Full ROS performed and is otherwise negative other than what is stated in the HPI    BP 107/72   Pulse 61   Temp 98.2 F (36.8 C) (Oral)   Ht 5\' 9"  (1.753 m)   Wt 280 lb 9.6 oz (127.3 kg)   SpO2 96%   BMI 41.44 kg/m   Physical Exam Vitals and nursing note reviewed. Exam conducted with a chaperone present.  Constitutional:      General: Neil Bailey is not in acute distress.    Appearance: Normal appearance. Neil Bailey is obese.  Eyes:     General: No scleral icterus.       Right eye: No discharge.        Left eye: No discharge.  Cardiovascular:     Rate and Rhythm: Normal rate. Rhythm irregular.     Heart sounds: No murmur heard.   Pulmonary:     Effort: Pulmonary effort is normal. No respiratory distress.     Breath sounds: Normal breath sounds. No stridor. No wheezing.  Abdominal:     General: Abdomen is flat. There is no distension.     Palpations: Abdomen is soft. There is no mass.     Tenderness: There is no abdominal tenderness. There is no guarding or rebound.     Hernia: A hernia is present.     Comments: Reducible  LIH with some discomfort on palpation.large left hydrocele.  Musculoskeletal:        General: No swelling or tenderness. Normal range of motion.     Cervical back: Normal range of motion and neck supple. No rigidity or tenderness.  Skin:    General: Skin is warm and dry.     Capillary Refill: Capillary refill takes less than 2 seconds.     Comments: Neil Bailey does have intertrigo on left inner thigh  Neurological:     General: No focal deficit present.     Mental Status: Neil Bailey is alert and oriented to person, place, and time.  Psychiatric:        Mood and Affect: Mood normal.        Behavior: Behavior normal.        Thought Content: Thought content normal.        Judgment: Judgment normal.      Assessment/Plan: 69 year old male with a symptomatic left hernia.  Neil Bailey is currently anticoagulated,  hernia contains bladder.  Neil Bailey  had a history of an open right inguinal hernia repair with mesh.  I definitely recommend repair of the left inguinal hernia and I do think robotic approach would be in his best interest.  Given that Neil Bailey has bladder within the hernia sac , we will get urology involved in the case so they can perform catheterization with ICG injection into the left ureter.  That way I will be able to identify and preserve the ureter and bladder during the operation.  Procedure discussed with patient detail.  Risk, benefits , and possible complications including but not limited to: Bleeding, chronic pain, bladder injury, recurrence, infection  injury to adjacent organs.  We will make sure that cardiology Dr. Saralyn Pilar is okay with holding Coumadin or they may have to bridge him with Lovenox depending on their recommendations.  Extensive counseling provided. D/w him about keeping perineum and thighs dry to prevent candida infection of intertrigo. Neil Bailey verbalized understanding. Case d/w Dr. Diamantina Providence in detail.   Caroleen Hamman, MD [facility] General Surgeon

## 2020-03-19 ENCOUNTER — Telehealth: Payer: Self-pay | Admitting: Surgery

## 2020-03-19 NOTE — Telephone Encounter (Signed)
Pt has been advised of Pre-Admission date/time, COVID Testing date and Surgery date.  Surgery Date: 04/10/20 Preadmission Testing Date: 04/01/20 (phone 8a-1p) Covid Testing Date: 04/08/20 - patient advised to go to the [place] ([place]) between 8a-1p   Patient has been made aware to call [phone], between 1-3:00pm the day before surgery, to find out what time to arrive for surgery.    Patient also has been informed he has been cleared for surgery by his cardiologist and to stop his Warfarin 5 days prior to surgery.  Patient voices understanding.

## 2020-03-20 ENCOUNTER — Telehealth: Payer: Self-pay | Admitting: Surgery

## 2020-03-20 NOTE — Telephone Encounter (Signed)
Updated information regarding rescheduled surgery.  Patient has been advised of Pre-Admission date/time, COVID Testing date and Surgery date.  Surgery Date: 04/03/20 Preadmission Testing Date: 03/30/20 (phone 8a-1p) Covid Testing Date: 04/01/20 - patient advised to go to the [place] ([place]) between 8a-1p  Patient has been made aware to call [phone], between 1-3:00pm the day before surgery, to find out what time to arrive for surgery.

## 2020-03-30 ENCOUNTER — Other Ambulatory Visit: Payer: Self-pay

## 2020-03-30 ENCOUNTER — Encounter
Admission: RE | Admit: 2020-03-30 | Discharge: 2020-03-30 | Disposition: A | Discharge status: Home / Self Care | Payer: Medicare HMO | Source: Ambulatory Visit | Attending: Surgery | Admitting: Surgery

## 2020-03-30 DIAGNOSIS — Z20822 Contact with and (suspected) exposure to covid-19: Secondary | ICD-10-CM | POA: Diagnosis not present

## 2020-03-30 DIAGNOSIS — Z01812 Encounter for preprocedural laboratory examination: Secondary | ICD-10-CM | POA: Diagnosis present

## 2020-03-30 DIAGNOSIS — I4891 Unspecified atrial fibrillation: Secondary | ICD-10-CM | POA: Diagnosis not present

## 2020-03-30 DIAGNOSIS — Z01818 Encounter for other preprocedural examination: Secondary | ICD-10-CM | POA: Insufficient documentation

## 2020-03-30 HISTORY — DX: Essential (primary) hypertension: I10

## 2020-03-30 HISTORY — DX: Dyspnea, unspecified: R06.00

## 2020-03-30 NOTE — Pre-Procedure Instructions (Signed)
[place] 02/04/2020 Component    WBC (White Blood Cell Count)  RBC (Red Blood Cell Count)  Hemoglobin  Hematocrit  MCV (Mean Corpuscular Volume)  MCH (Mean Corpuscular Hemoglobin)  MCHC (Mean Corpuscular Hemoglobin Concentration)  RDW-CV (Red Cell Distribution Width)  MPV (Mean Platelet Volume)  Immature Granulocyte Count  Component 02/04/2020 09/18/2019 07/18/2019 12/27/2018 12/25/2017           WBC (White Blood Cell Count) 6.0 -- 13.1High   7.5 6.5 Load older lab results  RBC (Red Blood Cell Count) 4.93 -- 5.09 4.96 5.01 Load older lab results  Hemoglobin 14.6 14.6 14.8 14.3 14.7 Load older lab results  Hematocrit 45.2 -- 47.5 45.2 46.3 Load older lab results  MCV (Mean Corpuscular Volume) 91.7 -- 93.3 91.1 92.4 Load older lab results  MCH (Mean Corpuscular Hemoglobin) 29.6 -- 29.1 28.8 29.3 Load older lab results  MCHC (Mean Corpuscular Hemoglobin Concentration) 32.3 -- 31.2Low   31.6Low   31.7Low   Load older lab results  RDW-CV (Red Cell Distribution Width) 15.0High   -- 16.8High   14.8 14.6 Load older lab results  MPV (Mean Platelet Volume) 11.9 -- 11.3 10.8 10.2 Load older lab results  Immature Granulocyte Count 0.04 -- 0.46High   0.03 0.03 Load older lab results  [place] 02/04/2020 Component    Glucose  Sodium  Potassium  Chloride  Carbon Dioxide (CO2)  Urea Nitrogen (BUN)  Creatinine  Glomerular Filtration Rate (eGFR), MDRD Estimate  Calcium  AST   ALT   Alk Phos (alkaline Phosphatase)  Albumin  Bilirubin, Total  Protein, Total  A/G Ratio  Magnesium  BUN/Crea Ratio  Anion Gap w/K  Component 02/04/2020 09/18/2019 07/18/2019 12/27/2018 12/25/2017           Glucose 90 73 98 84 84 Load older lab results  Sodium 139 141 139 142 141 Load older lab results  Potassium 4.1 3.7 4.3 4.2 3.9 Load older lab results  Chloride 103 104 101 103 102 Load older lab results  Carbon Dioxide  (CO2) 31.4 30.3 33.1High   31.4 32.5High   Load older lab results  Urea Nitrogen (BUN) 18 15 12 15 14  Load older lab results  Creatinine 1.2 1.1 1.0 1.1 1.1 Load older lab results  Glomerular Filtration Rate (eGFR), MDRD Estimate 60Low   67 74 67 5 Load older lab results  Calcium 9.2 9.2 9.4 9.4 9.2 Load older lab results  AST  17 -- 23 17 16  Load older lab results  ALT  12 -- 46 11 12 Load older lab results  Alk Phos (alkaline Phosphatase) 57 -- 63 68 54 Load older lab results  Albumin 4.3 -- 4.0 4.1 4.3 Load older lab results  Bilirubin, Total 0.7 -- 1.0 0.5 0.6 Load older lab results  Protein, Total 6.7 -- 6.9 7.0 7.0 Load older lab results  A/G Ratio 1.8 -- 1.4 1.4 1.6 Load older lab results  Magnesium -- -- -- -- -- Load older lab results  BUN/Crea Ratio -- 13.6 -- -- --   Anion Gap w/K -- 10.4 -- -- --   [place] 02/04/2020 Component    Glucose  Hemoglobin A1C  Average Blood Glucose (Calc)  Component 02/04/2020 02/04/2020 09/18/2019 07/18/2019 12/27/2018 12/27/2018 12/25/2017 12/25/2017              Glucose 90 -- 73 98 84 -- 84 -- Load older lab results  Hemoglobin A1C -- 5.8High   -- -- --  5.9High   -- 5.9High   Load older lab results  Average Blood Glucose (Calc) -- 120 -- -- -- 123 -- 123 Load older lab results  [place]

## 2020-03-30 NOTE — Patient Instructions (Signed)
Your procedure is scheduled on: 04-03-20 FRIDAY Report to Same Day Surgery 2nd floor medical mall [facility] Entrance-take elevator on left to 2nd floor.  Check in with surgery information desk.) To find out your arrival time please call [phone] between 1PM - 3PM on 04-02-20 THURSDAY  Remember: Instructions that are not followed completely may result in serious medical risk, up to and including death, or upon the discretion of your surgeon and anesthesiologist your surgery may need to be rescheduled.    _x___ 1. Do not eat food after midnight the night before your procedure. NO GUM OR CANDY AFTER MIDNIGHT. You may drink clear liquids up to 2 hours before you are scheduled to arrive at the hospital for your procedure.  Do not drink clear liquids within 2 hours of your scheduled arrival to the hospital.  Clear liquids include  --Water or Apple juice without pulp  -- Gatorade  --Black Coffee or Clear Tea (No milk, no creamers, do not add anything to the coffee or Tea-OK TO ADD SUGAR)    __x__ 2. No Alcohol for 24 hours before or after surgery.   __x__3. No Smoking or e-cigarettes for 24 prior to surgery.  Do not use any chewable tobacco products for at least 6 hour prior to surgery   ____  4. Bring all medications with you on the day of surgery if instructed.    __x__ 5. Notify your doctor if there is any change in your medical condition     (cold, fever, infections).    x___6. On the morning of surgery brush your teeth with toothpaste and water.  You may rinse your mouth with mouth wash if you wish.  Do not swallow any toothpaste or mouthwash.   Do not wear jewelry, make-up, hairpins, clips or nail polish.  Do not wear lotions, powders, or perfumes.  Do not shave 48 hours prior to surgery. Men may shave face and neck.  Do not bring valuables to the hospital.    [facility] is not responsible for any belongings or valuables.               Contacts, dentures or bridgework may not  be worn into surgery.  Leave your suitcase in the car. After surgery it may be brought to your room.  For patients admitted to the hospital, discharge time is determined by your treatment team.  _  Patients discharged the day of surgery will not be allowed to drive home.  You will need someone to drive you home and stay with you the night of your procedure.    Please read over the following fact sheets that you were given:   [facility] Preparing for Surgery   _x___ TAKE THE FOLLOWING MEDICATION THE MORNING OF SURGERY WITH A SMALL SIP OF WATER. These include:  1. METOPROLOL (TOPROL)  2.  3.  4.  5.  6.  ____Fleets enema or Magnesium Citrate as directed.   _x___ Use CHG Soap as directed on instruction sheet   ____ Use inhalers on the day of surgery and bring to hospital day of surgery  ____ Stop Metformin and Janumet 2 days prior to surgery.    ____ Take 1/2 of usual insulin dose the night before surgery and none on the morning surgery.   _X___ Follow recommendations from Cardiologist, Pulmonologist or PCP regarding stopping Aspirin, Coumadin, Plavix ,Eliquis, Effient, or Pradaxa, and Pletal-LAST DOSE OF WARFARIN (COUMADIN) WAS ON  03-28-20 SATURDAY  X____Stop Anti-inflammatories such as  Advil, Aleve, Ibuprofen, Motrin, Naproxen, Naprosyn, Goodies powders or aspirin products NOW- OK to take Tylenol   ____ Stop supplements until after surgery.   ____ Bring C-Pap to the hospital.

## 2020-03-30 NOTE — Progress Notes (Signed)
[facility] Perioperative Services  Pre-Admission/Anesthesia Testing Clinical Review  Date: 04/01/20  Patient Demographics:  Name: Neil Bailey DOB:   10/23/1950 MRN:   [id number]  Planned Surgical Procedure(s):    Case: [id number] Date/Time: 04/03/20 0715   Procedures:      XI ROBOTIC ASSISTED INGUINAL HERNIA REPAIR WITH MESH, possible bilateral (Left )     CYSTOSCOPY W/ ICGREEN (Left )   Anesthesia type: General   Pre-op diagnosis: Left inguinal hernia   Location: [facility] OR ROOM 04 / [facility] ORS FOR ANESTHESIA GROUP   Surgeons: Jules Husbands, MD; [name], MD     NOTE: Available PAT nursing documentation and vital signs have been reviewed. Clinical nursing staff has updated patient's PMH/PSHx, current medication list, and drug allergies/intolerances to ensure comprehensive history available to assist in medical decision making as it pertains to the aforementioned surgical procedure and anticipated anesthetic course.   Clinical Discussion:  Neil Bailey is a 69 y.o. male who is submitted for pre-surgical anesthesia review and clearance prior to him undergoing the above procedure.Patient is a Former Smoker (42 pack years; quit 07/1986). Pertinent PMH includes: A. fib, post SARS-CoV-2 dyspnea, HTN, DVT, GERD (on daily H2 blocker)  Patient is followed by cardiology Saralyn Pilar, MD). He was last seen in the cardiology clinic on 12/19/2019; notes reviewed.  At the time of his clinic visit, patient was reported to be doing well overall from a cardiac standpoint.  Patient denied chest pain, shortness of breath, orthopnea, PND, palpitations, vertiginous symptoms, and presyncope/syncope.  Patient has chronic peripheral edema for which he is on loop diuretic therapy (furosemide).  Patient remains active, however does not exercise on a regular basis due to chronic knee pain. CHA2DS2-VASc Score = 3. Patient currently on daily anticoagulation using warfarin.  Of note,  patient previously on apixaban, however it was discontinued due to associated financial burden.  Hypertension well controlled on metoprolol and HCTZ.    Patient scheduled to undergo elective hernia repair.  Given history of PAF, presurgical cardiac clearance was requested by attending surgeon.  Per cardiology, "patient is optimized for surgery.  Patient may proceed with planned surgical intervention with a LOW risk ratification.  May hold warfarin for 5 days prior to procedure". The patient has been instructed that his last dose of his anticoagulant will be on 03/29/2020.  He denies previous perioperative complications with anesthesia. He underwent a general anesthetic course here (ASA III) in 01/2015 with no documented complications.   Vitals with BMI 03/18/2020 03/11/2020 02/20/2020  Height 5\' 9"  5\' 9"  5\' 9"   Weight 280 lbs 10 oz 282 lbs 10 oz 285 lbs  BMI 41.42 37.85 88.50  Systolic 277 412 878  Diastolic 72 78 70  Pulse 61 74 91    Providers/Specialists:   NOTE: Primary physician provider listed below. Patient may have been seen by APP or partner within same practice.   PROVIDER ROLE LAST OV  No att. providers found General Surgery 03/18/2020  [name], MD Primary Care Provider 02/11/2020  Isaias Cowman, MD Cardiology 12/19/2019   Allergies:  Plasma, human  Current Home Medications:   . hydrochlorothiazide (HYDRODIURIL) 25 MG tablet  . metoprolol succinate (TOPROL-XL) 50 MG 24 hr tablet  . warfarin (COUMADIN) 5 MG tablet  . acetaminophen (TYLENOL) 500 MG tablet  . albuterol (VENTOLIN HFA) 108 (90 Base) MCG/ACT inhaler  . digoxin (LANOXIN) 0.25 MG tablet  . famotidine (PEPCID) 20 MG tablet  . furosemide (LASIX) 20 MG tablet  .  metoprolol tartrate (LOPRESSOR) 50 MG tablet   No current facility-administered medications for this encounter.   History:   Past Medical History:  Diagnosis Date  . Atrial fibrillation ([place])   . COVID-19 06/2019  . DVT (deep venous  thrombosis) ([place])   . Dyspnea    since covid dec 2020  . HTN (hypertension)   . Lab test positive for detection of COVID-19 virus 06/30/2019   Past Surgical History:  Procedure Laterality Date  . COLONOSCOPY  7-8 years ago   [facility]  . EYE SURGERY  05/2014, 06/2014   cataracts  . INGUINAL HERNIA REPAIR Right 01/19/2015   Procedure: RIGHT INCARCERATED INGUINAL HERNIA REPAIR;  Surgeon: Christene Lye, MD;  Location: [facility] ORS;  Service: General;  Laterality: Right;  . TONSILLECTOMY     History reviewed. No pertinent family history. Social History   Tobacco Use  . Smoking status: Former Smoker    Packs/day: 1.50    Years: 28.00    Pack years: 42.00    Types: Cigarettes    Quit date: 07/11/1986    Years since quitting: 33.7  . Smokeless tobacco: Never Used  Vaping Use  . Vaping Use: Never used  Substance Use Topics  . Alcohol use: Yes    Alcohol/week: 0.0 standard drinks    Comment: occ  . Drug use: No    Pertinent Clinical Results:  LABS: Labs reviewed: Acceptable for surgery.       ECG: Date: 04/01/2020 Time ECG obtained: 0956 AM Rate: 81 bpm Rhythm: atrial fibrillation Axis (leads I and aVF): Normal Intervals: QRS 84 ms. QTc 427 ms. ST segment and T wave changes: No evidence of acute ST segment elevation or depression Comparison: Similar to previous tracing obtained on 08/25/2019   IMAGING / PROCEDURES: CT ABDOMEN AND PELVIS W/O CONTRAST done on 03/09/2020 1. Large left inguinal hernia contains fat and a portion of the urinary bladder. 2. Scattered colonic diverticulosis.  No active diverticulitis. 3. 3.5 cm low-density lesion in the midpole of the right kidney cannot be characterized on this noncontrast study. Recommend further evaluation with renal ultrasound or MRI. 4. Aortic atherosclerosis.  Impression and Plan:  Dianne Bady has been referred for pre-anesthesia review and clearance prior to him undergoing the planned anesthetic and procedural  courses. Available labs, pertinent testing, and imaging results were personally reviewed by me. This patient has been appropriately cleared by cardiology.  Based on clinical review performed today (04/01/20), barring any significant acute changes in the patient's overall condition, it is anticipated that he will be able to proceed with the planned surgical intervention. Any acute changes in clinical condition may necessitate his procedure being postponed and/or cancelled. Pre-surgical instructions were reviewed with the patient during his PAT appointment and questions were fielded by PAT clinical staff.  Honor Loh, MSN, APRN, FNP-C, CEN [facility]  Peri-operative Services Nurse Practitioner Phone: [phone] 04/01/20 12:51 PM  NOTE: This note has been prepared using Dragon dictation software. Despite my best ability to proofread, there is always the potential that unintentional transcriptional errors may still occur from this process.

## 2020-04-01 ENCOUNTER — Encounter
Admission: RE | Admit: 2020-04-01 | Discharge: 2020-04-01 | Disposition: A | Discharge status: Home / Self Care | Payer: Medicare HMO | Source: Ambulatory Visit | Attending: Surgery | Admitting: Surgery

## 2020-04-01 ENCOUNTER — Other Ambulatory Visit: Payer: Self-pay

## 2020-04-01 ENCOUNTER — Other Ambulatory Visit: Payer: Medicare HMO

## 2020-04-01 DIAGNOSIS — I4891 Unspecified atrial fibrillation: Secondary | ICD-10-CM | POA: Insufficient documentation

## 2020-04-01 DIAGNOSIS — Z01818 Encounter for other preprocedural examination: Secondary | ICD-10-CM | POA: Diagnosis not present

## 2020-04-01 DIAGNOSIS — Z20822 Contact with and (suspected) exposure to covid-19: Secondary | ICD-10-CM | POA: Insufficient documentation

## 2020-04-01 LAB — POTASSIUM: Potassium: 3.7 mmol/L (ref 3.5–5.1)

## 2020-04-01 LAB — SARS CORONAVIRUS 2 (TAT 6-24 HRS): SARS Coronavirus 2: NEGATIVE

## 2020-04-03 ENCOUNTER — Ambulatory Visit: Payer: Medicare HMO | Admitting: Certified Registered Nurse Anesthetist

## 2020-04-03 ENCOUNTER — Ambulatory Visit: Payer: Medicare HMO | Admitting: Urgent Care

## 2020-04-03 ENCOUNTER — Ambulatory Visit
Admission: RE | Admit: 2020-04-03 | Discharge: 2020-04-03 | Disposition: A | Discharge status: Home / Self Care | Payer: Medicare HMO | Attending: Surgery | Admitting: Surgery

## 2020-04-03 ENCOUNTER — Encounter: Payer: Self-pay | Admitting: Surgery

## 2020-04-03 ENCOUNTER — Other Ambulatory Visit: Payer: Self-pay

## 2020-04-03 ENCOUNTER — Encounter
Admission: RE | Disposition: A | Discharge status: Home / Self Care | Payer: Self-pay | Source: Home / Self Care | Attending: Surgery

## 2020-04-03 DIAGNOSIS — Z86718 Personal history of other venous thrombosis and embolism: Secondary | ICD-10-CM | POA: Diagnosis not present

## 2020-04-03 DIAGNOSIS — Z408 Encounter for other prophylactic surgery: Secondary | ICD-10-CM | POA: Diagnosis not present

## 2020-04-03 DIAGNOSIS — Z87891 Personal history of nicotine dependence: Secondary | ICD-10-CM | POA: Diagnosis not present

## 2020-04-03 DIAGNOSIS — Z7901 Long term (current) use of anticoagulants: Secondary | ICD-10-CM | POA: Diagnosis not present

## 2020-04-03 DIAGNOSIS — N3289 Other specified disorders of bladder: Secondary | ICD-10-CM | POA: Diagnosis not present

## 2020-04-03 DIAGNOSIS — N433 Hydrocele, unspecified: Secondary | ICD-10-CM | POA: Diagnosis not present

## 2020-04-03 DIAGNOSIS — K4091 Unilateral inguinal hernia, without obstruction or gangrene, recurrent: Secondary | ICD-10-CM | POA: Diagnosis not present

## 2020-04-03 DIAGNOSIS — I4891 Unspecified atrial fibrillation: Secondary | ICD-10-CM | POA: Insufficient documentation

## 2020-04-03 DIAGNOSIS — K4090 Unilateral inguinal hernia, without obstruction or gangrene, not specified as recurrent: Secondary | ICD-10-CM | POA: Diagnosis present

## 2020-04-03 HISTORY — PX: XI ROBOTIC ASSISTED INGUINAL HERNIA REPAIR WITH MESH: SHX6706

## 2020-04-03 HISTORY — PX: CYSTOSCOPY: SHX5120

## 2020-04-03 LAB — PROTIME-INR
INR: 1.1 (ref 0.8–1.2)
Prothrombin Time: 13.8 seconds (ref 11.4–15.2)

## 2020-04-03 SURGERY — REPAIR, HERNIA, INGUINAL, ROBOT-ASSISTED, LAPAROSCOPIC, USING MESH
Anesthesia: General | Laterality: Left

## 2020-04-03 MED ORDER — PHENYLEPHRINE HCL (PRESSORS) 10 MG/ML IV SOLN
INTRAVENOUS | Status: AC
  Filled 2020-04-03: qty 1

## 2020-04-03 MED ORDER — SUCCINYLCHOLINE CHLORIDE 200 MG/10ML IV SOSY
PREFILLED_SYRINGE | INTRAVENOUS | Status: AC
  Filled 2020-04-03: qty 10

## 2020-04-03 MED ORDER — LIDOCAINE HCL (PF) 2 % IJ SOLN
INTRAMUSCULAR | Status: AC
  Filled 2020-04-03: qty 5

## 2020-04-03 MED ORDER — BUPIVACAINE-EPINEPHRINE 0.25% -1:200000 IJ SOLN
INTRAMUSCULAR | Status: DC | PRN
  Administered 2020-04-03: 30 mL

## 2020-04-03 MED ORDER — ONDANSETRON HCL 4 MG/2ML IJ SOLN
INTRAMUSCULAR | Status: AC
  Filled 2020-04-03: qty 2

## 2020-04-03 MED ORDER — DEXTROSE 5 % IV SOLN
3.0000 g | INTRAVENOUS | Status: AC
  Administered 2020-04-03: 3 g via INTRAVENOUS
  Filled 2020-04-03: qty 3

## 2020-04-03 MED ORDER — PROPOFOL 10 MG/ML IV BOLUS
INTRAVENOUS | Status: AC
  Filled 2020-04-03: qty 40

## 2020-04-03 MED ORDER — BUPIVACAINE-EPINEPHRINE (PF) 0.25% -1:200000 IJ SOLN
INTRAMUSCULAR | Status: AC
  Filled 2020-04-03: qty 30

## 2020-04-03 MED ORDER — PROPOFOL 10 MG/ML IV BOLUS
INTRAVENOUS | Status: DC | PRN
  Administered 2020-04-03: 180 mg via INTRAVENOUS

## 2020-04-03 MED ORDER — FAMOTIDINE 20 MG PO TABS
ORAL_TABLET | ORAL | Status: AC
  Administered 2020-04-03: 20 mg via ORAL
  Filled 2020-04-03: qty 1

## 2020-04-03 MED ORDER — GABAPENTIN 300 MG PO CAPS: 300.0000 mg | ORAL_CAPSULE | ORAL | Status: AC

## 2020-04-03 MED ORDER — HYDROCODONE-ACETAMINOPHEN 5-325 MG PO TABS: 1.0000 | ORAL_TABLET | ORAL | 0 refills | Status: DC | PRN

## 2020-04-03 MED ORDER — GLYCOPYRROLATE 0.2 MG/ML IJ SOLN
INTRAMUSCULAR | Status: AC
  Filled 2020-04-03: qty 1

## 2020-04-03 MED ORDER — FAMOTIDINE 20 MG PO TABS: 20.0000 mg | ORAL_TABLET | Freq: Once | ORAL | Status: AC

## 2020-04-03 MED ORDER — FENTANYL CITRATE (PF) 100 MCG/2ML IJ SOLN: 25.0000 ug | INTRAMUSCULAR | Status: DC | PRN

## 2020-04-03 MED ORDER — DEXAMETHASONE SODIUM PHOSPHATE 10 MG/ML IJ SOLN
INTRAMUSCULAR | Status: DC | PRN
  Administered 2020-04-03: 10 mg via INTRAVENOUS

## 2020-04-03 MED ORDER — ACETAMINOPHEN 500 MG PO TABS
ORAL_TABLET | ORAL | Status: AC
  Administered 2020-04-03: 1000 mg via ORAL
  Filled 2020-04-03: qty 2

## 2020-04-03 MED ORDER — BUPIVACAINE LIPOSOME 1.3 % IJ SUSP
INTRAMUSCULAR | Status: DC | PRN
  Administered 2020-04-03: 20 mL

## 2020-04-03 MED ORDER — BUPIVACAINE LIPOSOME 1.3 % IJ SUSP
INTRAMUSCULAR | Status: AC
  Filled 2020-04-03: qty 20

## 2020-04-03 MED ORDER — CHLORHEXIDINE GLUCONATE 0.12 % MT SOLN: 15.0000 mL | Freq: Once | OROMUCOSAL | Status: AC

## 2020-04-03 MED ORDER — SEVOFLURANE IN SOLN
RESPIRATORY_TRACT | Status: AC
  Filled 2020-04-03: qty 250

## 2020-04-03 MED ORDER — FENTANYL CITRATE (PF) 100 MCG/2ML IJ SOLN
INTRAMUSCULAR | Status: DC | PRN
Start: 2020-04-03 — End: 2020-04-03
  Administered 2020-04-03 (×4): 50 ug via INTRAVENOUS

## 2020-04-03 MED ORDER — DEXAMETHASONE SODIUM PHOSPHATE 10 MG/ML IJ SOLN
INTRAMUSCULAR | Status: AC
  Filled 2020-04-03: qty 1

## 2020-04-03 MED ORDER — LIDOCAINE HCL (CARDIAC) PF 100 MG/5ML IV SOSY
PREFILLED_SYRINGE | INTRAVENOUS | Status: DC | PRN
  Administered 2020-04-03: 100 mg via INTRAVENOUS

## 2020-04-03 MED ORDER — SUCCINYLCHOLINE CHLORIDE 20 MG/ML IJ SOLN
INTRAMUSCULAR | Status: DC | PRN
  Administered 2020-04-03: 120 mg via INTRAVENOUS

## 2020-04-03 MED ORDER — LACTATED RINGERS IV SOLN: INTRAVENOUS | Status: DC

## 2020-04-03 MED ORDER — ACETAMINOPHEN 500 MG PO TABS: 1000.0000 mg | ORAL_TABLET | ORAL | Status: AC

## 2020-04-03 MED ORDER — ROCURONIUM BROMIDE 100 MG/10ML IV SOLN
INTRAVENOUS | Status: DC | PRN
  Administered 2020-04-03: 40 mg via INTRAVENOUS
  Administered 2020-04-03: 30 mg via INTRAVENOUS
  Administered 2020-04-03: 10 mg via INTRAVENOUS
  Administered 2020-04-03: 20 mg via INTRAVENOUS
  Administered 2020-04-03: 10 mg via INTRAVENOUS
  Administered 2020-04-03: 20 mg via INTRAVENOUS

## 2020-04-03 MED ORDER — MIDAZOLAM HCL 2 MG/2ML IJ SOLN
INTRAMUSCULAR | Status: AC
  Filled 2020-04-03: qty 2

## 2020-04-03 MED ORDER — CELECOXIB 200 MG PO CAPS
ORAL_CAPSULE | ORAL | Status: AC
  Administered 2020-04-03: 200 mg via ORAL
  Filled 2020-04-03: qty 1

## 2020-04-03 MED ORDER — CHLORHEXIDINE GLUCONATE CLOTH 2 % EX PADS
6.0000 | MEDICATED_PAD | Freq: Once | CUTANEOUS | Status: AC
  Administered 2020-04-03: 6 via TOPICAL

## 2020-04-03 MED ORDER — SUGAMMADEX SODIUM 500 MG/5ML IV SOLN
INTRAVENOUS | Status: AC
  Filled 2020-04-03: qty 5

## 2020-04-03 MED ORDER — CELECOXIB 200 MG PO CAPS: 200.0000 mg | ORAL_CAPSULE | ORAL | Status: AC

## 2020-04-03 MED ORDER — SUGAMMADEX SODIUM 500 MG/5ML IV SOLN
INTRAVENOUS | Status: DC | PRN
  Administered 2020-04-03: 254 mg via INTRAVENOUS

## 2020-04-03 MED ORDER — CEFAZOLIN SODIUM-DEXTROSE 2-4 GM/100ML-% IV SOLN
INTRAVENOUS | Status: AC
  Filled 2020-04-03: qty 100

## 2020-04-03 MED ORDER — FENTANYL CITRATE (PF) 100 MCG/2ML IJ SOLN
INTRAMUSCULAR | Status: AC
  Filled 2020-04-03: qty 2

## 2020-04-03 MED ORDER — ROCURONIUM BROMIDE 10 MG/ML (PF) SYRINGE
PREFILLED_SYRINGE | INTRAVENOUS | Status: AC
  Filled 2020-04-03: qty 10

## 2020-04-03 MED ORDER — ORAL CARE MOUTH RINSE: 15.0000 mL | Freq: Once | OROMUCOSAL | Status: AC

## 2020-04-03 MED ORDER — ONDANSETRON HCL 4 MG/2ML IJ SOLN: 4.0000 mg | Freq: Once | INTRAMUSCULAR | Status: DC | PRN

## 2020-04-03 MED ORDER — KETOROLAC TROMETHAMINE 30 MG/ML IJ SOLN
INTRAMUSCULAR | Status: AC
  Filled 2020-04-03: qty 1

## 2020-04-03 MED ORDER — KETOROLAC TROMETHAMINE 30 MG/ML IJ SOLN
INTRAMUSCULAR | Status: DC | PRN
  Administered 2020-04-03: 15 mg via INTRAVENOUS

## 2020-04-03 MED ORDER — ONDANSETRON HCL 4 MG/2ML IJ SOLN
INTRAMUSCULAR | Status: DC | PRN
  Administered 2020-04-03: 4 mg via INTRAVENOUS

## 2020-04-03 MED ORDER — CHLORHEXIDINE GLUCONATE 0.12 % MT SOLN
OROMUCOSAL | Status: AC
  Administered 2020-04-03: 15 mL via OROMUCOSAL
  Filled 2020-04-03: qty 15

## 2020-04-03 MED ORDER — PHENYLEPHRINE HCL (PRESSORS) 10 MG/ML IV SOLN
INTRAVENOUS | Status: DC | PRN
  Administered 2020-04-03 (×3): 100 ug via INTRAVENOUS

## 2020-04-03 MED ORDER — MIDAZOLAM HCL 2 MG/2ML IJ SOLN
INTRAMUSCULAR | Status: DC | PRN
  Administered 2020-04-03: 2 mg via INTRAVENOUS

## 2020-04-03 MED ORDER — GABAPENTIN 300 MG PO CAPS
ORAL_CAPSULE | ORAL | Status: AC
  Administered 2020-04-03: 300 mg via ORAL
  Filled 2020-04-03: qty 1

## 2020-04-03 SURGICAL SUPPLY — 90 items
"PENCIL ELECTRO HAND CTR " (MISCELLANEOUS) ×1 IMPLANT
ADAPTER CATH URET CONN 4-6FR (ADAPTER) IMPLANT
ADAPTER GOLDBERG URETERAL (ADAPTER) ×3 IMPLANT
ADH SKN CLS APL DERMABOND .7 (GAUZE/BANDAGES/DRESSINGS) ×1
ADPR CATH 15X14FR FL DRN BG (ADAPTER) ×1
ADPR CATH 3-6FR M LL SDARM Y (MISCELLANEOUS)
APL PRP STRL LF DISP 70% ISPRP (MISCELLANEOUS) ×1
BAG BILE T-TUBES STRL (MISCELLANEOUS) IMPLANT
BAG DRAIN CYSTO-URO LG1000N (MISCELLANEOUS) ×3 IMPLANT
BAG DRN 9.5 2 ADJ BELT ADPR (MISCELLANEOUS)
BAG DRN RND TRDRP ANRFLXCHMBR (UROLOGICAL SUPPLIES) ×1
BAG URINE DRAIN 2000ML AR STRL (UROLOGICAL SUPPLIES) ×3 IMPLANT
BRUSH SCRUB EZ  4% CHG (MISCELLANEOUS) ×2
BRUSH SCRUB EZ 4% CHG (MISCELLANEOUS) ×1 IMPLANT
CANISTER SUCT 1200ML W/VALVE (MISCELLANEOUS) ×3 IMPLANT
CANNULA REDUC XI 12-8 STAPL (CANNULA) ×1
CANNULA REDUC XI 12-8MM STAPL (CANNULA) ×1
CANNULA REDUCER 12-8 DVNC XI (CANNULA) ×1 IMPLANT
CATH FOLEY 2WAY  5CC 16FR (CATHETERS)
CATH FOLEY 2WAY 5CC 16FR (CATHETERS)
CATH FOLEY SIL 2WAY 14FR5CC (CATHETERS) IMPLANT
CATH URETL 5X70 OPEN END (CATHETERS) IMPLANT
CATH URTH 16FR FL 2W BLN LF (CATHETERS) IMPLANT
CHLORAPREP W/TINT 26 (MISCELLANEOUS) ×3 IMPLANT
COVER TIP SHEARS 8 DVNC (MISCELLANEOUS) ×1 IMPLANT
COVER TIP SHEARS 8MM DA VINCI (MISCELLANEOUS) ×2
COVER WAND RF STERILE (DRAPES) ×3 IMPLANT
DEFOGGER SCOPE WARMER CLEARIFY (MISCELLANEOUS) ×3 IMPLANT
DERMABOND ADVANCED (GAUZE/BANDAGES/DRESSINGS) ×2
DERMABOND ADVANCED .7 DNX12 (GAUZE/BANDAGES/DRESSINGS) ×1 IMPLANT
DRAPE 3/4 80X56 (DRAPES) ×3 IMPLANT
DRAPE ARM DVNC X/XI (DISPOSABLE) ×3 IMPLANT
DRAPE COLUMN DVNC XI (DISPOSABLE) ×1 IMPLANT
DRAPE DA VINCI XI ARM (DISPOSABLE) ×6
DRAPE DA VINCI XI COLUMN (DISPOSABLE) ×2
ELECT CAUTERY BLADE 6.4 (BLADE) ×3 IMPLANT
ELECT REM PT RETURN 9FT ADLT (ELECTROSURGICAL) ×3
ELECTRODE REM PT RTRN 9FT ADLT (ELECTROSURGICAL) ×1 IMPLANT
GLOVE BIO SURGEON STRL SZ7 (GLOVE) ×12 IMPLANT
GLOVE BIOGEL PI IND STRL 7.5 (GLOVE) ×1 IMPLANT
GLOVE BIOGEL PI INDICATOR 7.5 (GLOVE) ×2
GOWN STRL REUS W/ TWL LRG LVL3 (GOWN DISPOSABLE) ×4 IMPLANT
GOWN STRL REUS W/ TWL XL LVL3 (GOWN DISPOSABLE) ×1 IMPLANT
GOWN STRL REUS W/TWL LRG LVL3 (GOWN DISPOSABLE) ×12
GOWN STRL REUS W/TWL XL LVL3 (GOWN DISPOSABLE) ×3
GUIDEWIRE STR DUAL SENSOR (WIRE) ×3 IMPLANT
IRRIGATION STRYKERFLOW (MISCELLANEOUS) ×1 IMPLANT
IRRIGATOR STRYKERFLOW (MISCELLANEOUS) ×3
IV NS 1000ML (IV SOLUTION)
IV NS 1000ML BAXH (IV SOLUTION) IMPLANT
KIT IMAGING PINPOINTPAQ (MISCELLANEOUS) ×2 IMPLANT
KIT PINK PAD W/HEAD ARE REST (MISCELLANEOUS) ×3
KIT PINK PAD W/HEAD ARM REST (MISCELLANEOUS) ×1 IMPLANT
KIT TURNOVER CYSTO (KITS) ×3 IMPLANT
LABEL OR SOLS (LABEL) ×3 IMPLANT
MESH 3DMAX 4X6 LT LRG (Mesh General) ×2 IMPLANT
NEEDLE HYPO 22GX1.5 SAFETY (NEEDLE) ×3 IMPLANT
OBTURATOR OPTICAL STANDARD 8MM (TROCAR) ×2
OBTURATOR OPTICAL STND 8 DVNC (TROCAR) ×1
OBTURATOR OPTICALSTD 8 DVNC (TROCAR) ×1 IMPLANT
PACK CYSTO AR (MISCELLANEOUS) ×3 IMPLANT
PACK LAP CHOLECYSTECTOMY (MISCELLANEOUS) ×3 IMPLANT
PENCIL ELECTRO HAND CTR (MISCELLANEOUS) ×3 IMPLANT
SEAL CANN UNIV 5-8 DVNC XI (MISCELLANEOUS) ×3 IMPLANT
SEAL XI 5MM-8MM UNIVERSAL (MISCELLANEOUS) ×6
SET CYSTO W/LG BORE CLAMP LF (SET/KITS/TRAYS/PACK) ×3 IMPLANT
SET TUBE SMOKE EVAC HIGH FLOW (TUBING) ×3 IMPLANT
SIDE-ARM FITTING (MISCELLANEOUS) IMPLANT
SOL .9 NS 3000ML IRR  AL (IV SOLUTION) ×2
SOL .9 NS 3000ML IRR AL (IV SOLUTION) ×1
SOL .9 NS 3000ML IRR UROMATIC (IV SOLUTION) ×1 IMPLANT
SOLUTION ELECTROLUBE (MISCELLANEOUS) ×3 IMPLANT
SPONGE LAP 18X18 RF (DISPOSABLE) ×3 IMPLANT
STAPLER CANNULA SEAL DVNC XI (STAPLE) ×1 IMPLANT
STAPLER CANNULA SEAL XI (STAPLE) ×2
STENT URET 6FRX24 CONTOUR (STENTS) IMPLANT
STENT URET 6FRX26 CONTOUR (STENTS) IMPLANT
SURGILUBE 2OZ TUBE FLIPTOP (MISCELLANEOUS) ×3 IMPLANT
SUT MNCRL AB 4-0 PS2 18 (SUTURE) ×3 IMPLANT
SUT SILK 0 (SUTURE)
SUT SILK 0 30XBRD TIE 6 (SUTURE) IMPLANT
SUT VIC AB 2-0 SH 27 (SUTURE) ×6
SUT VIC AB 2-0 SH 27XBRD (SUTURE) ×2 IMPLANT
SUT VICRYL 0 AB UR-6 (SUTURE) ×6 IMPLANT
SUT VLOC 90 S/L VL9 GS22 (SUTURE) ×3 IMPLANT
SYRINGE IRR TOOMEY STRL 70CC (SYRINGE) ×3 IMPLANT
TAPE TRANSPORE STRL 2 31045 (GAUZE/BANDAGES/DRESSINGS) ×3 IMPLANT
TROCAR BALLN GELPORT 12X130M (ENDOMECHANICALS) ×3 IMPLANT
TUBING ART PRESS 48 MALE/FEM (TUBING) IMPLANT
WATER STERILE IRR 1000ML POUR (IV SOLUTION) ×3 IMPLANT

## 2020-04-03 NOTE — OR Nursing (Addendum)
Discharge pending void.    UPDATE @ 13:55 - bladder scan - able to see very small green bladder border on screen but unable to pick up measurement.  Will continue to encourage po fluid intake. UPDATE - voided and d/c'd to home.  (Dr. Diamantina Providence in to see pt in postop earlier.)

## 2020-04-03 NOTE — Op Note (Signed)
Date of procedure: 04/03/20  Preoperative diagnosis:  1. Left inguinal hernia with bladder herniation into left scrotum  Postoperative diagnosis:  1. Same  Procedure: 1. Cystoscopy, left ureteral injection of ICG  Surgeon: [name], MD  Anesthesia: General  Complications: None  Intraoperative findings:  1.  Very high bladder neck, uncomplicated injection of ICG into the left ureter  EBL: None  Specimens: None  Drains: 18 French coud Foley  Indication: Neil Bailey is a 69 y.o. patient with large left inguinal hernia with herniation of large portion of the bladder into the scrotum.  After reviewing the management options for treatment, they elected to proceed with the above surgical procedure(s). We have discussed the potential benefits and risks of the procedure, side effects of the proposed treatment, the likelihood of the patient achieving the goals of the procedure, and any potential problems that might occur during the procedure or recuperation. Informed consent has been obtained.  Description of procedure:  The patient was taken to the operating room and general anesthesia was induced. SCDs were placed for DVT prophylaxis.The patient was placed in the dorsal lithotomy position, prepped and draped in the usual sterile fashion, and preoperative antibiotics were administered. A preoperative time-out was performed.   A 21 French rigid cystoscope was used to intubate the urethra and a normal-appearing urethra was followed proximally towards the bladder.  The prostate was very large with a high bladder neck and it was challenging just to get the scope into the bladder.  I was able to identify the trigone and the right ureteral orifice, and follow this over to the left side which had herniated only slightly down into the left groin.  The left ureteral orifice was identified and cannulated with a sensor wire, and an access catheter was advanced over the wire into the distal  ureter.  The wire was removed and 10 mL of ICG was injected into the left ureter.  An 42 French Foley was placed easily and 10 cc placed in the balloon.  The case was then turned over to Dr. Dahlia Byes in general surgery for hernia repair.  Disposition: Stable to PACU  Plan: Follow-up in clinic in 2 to 3 months for symptom check  [name], MD

## 2020-04-03 NOTE — Anesthesia Procedure Notes (Signed)
Procedure Name: Intubation Performed by: Demetrius Charity, CRNA Pre-anesthesia Checklist: Patient identified, Patient being monitored, Timeout performed, Emergency Drugs available and Suction available Patient Re-evaluated:Patient Re-evaluated prior to induction Oxygen Delivery Method: Circle system utilized Preoxygenation: Pre-oxygenation with 100% oxygen Induction Type: IV induction Ventilation: Mask ventilation without difficulty Laryngoscope Size: McGraph and 4 Grade View: Grade II Tube type: Oral Tube size: 7.5 mm Number of attempts: 1 Airway Equipment and Method: Stylet and Video-laryngoscopy Placement Confirmation: ETT inserted through vocal cords under direct vision,  positive ETCO2 and breath sounds checked- equal and bilateral Secured at: 24 cm Tube secured with: Tape Dental Injury: Teeth and Oropharynx as per pre-operative assessment  Difficulty Due To: Difficulty was anticipated, Difficult Airway- due to large tongue and Difficult Airway- due to limited oral opening

## 2020-04-03 NOTE — Transfer of Care (Signed)
Immediate Anesthesia Transfer of Care Note  Patient: Neil Bailey  Procedure(s) Performed: XI ROBOTIC ASSISTED INGUINAL HERNIA REPAIR WITH MESH, possible bilateral (Left ) CYSTOSCOPY W/ ICGREEN (Left )  Patient Location: PACU  Anesthesia Type:General  Level of Consciousness: awake and alert   Airway & Oxygen Therapy: Patient Spontanous Breathing and Patient connected to face mask oxygen  Post-op Assessment: Report given to RN and Post -op Vital signs reviewed and stable  Post vital signs: Reviewed and stable  Last Vitals:  Vitals Value Taken Time  BP 129/68 04/03/20 1029  Temp 37.3 C 04/03/20 1029  Pulse 100 04/03/20 1032  Resp 23 04/03/20 1032  SpO2 98 % 04/03/20 1032  Vitals shown include unvalidated device data.  Last Pain:  Vitals:   04/03/20 0607  TempSrc: Tympanic  PainSc: 0-No pain         Complications: No complications documented.

## 2020-04-03 NOTE — Op Note (Addendum)
Robotic assisted Laparoscopic Transabdominal Left Inguinal Hernia Repair with 3 D large Mesh      Pre-operative Diagnosis:  Left  Inguinal Hernia with large hydrocele   Post-operative Diagnosis: Same   Procedure: Robotic  Laparoscopic  repair of Left inguinal hernias   Surgeon: Caroleen Hamman, MD FACS   Anesthesia: Gen. with endotracheal tube   Findings: large Direct Left inguinal hernia  with large Left hydrocele      Procedure Details  The patient was seen again in the Holding Room. The benefits, complications, treatment options, and expected outcomes were discussed with the patient. The risks of bleeding, infection, recurrence of symptoms, failure to resolve symptoms, recurrence of hernia, ischemic orchitis, chronic pain syndrome or neuroma, were discussed again. The likelihood of improving the patient's symptoms with return to their baseline status is good.  The patient and/or family concurred with the proposed plan, giving informed consent.  The patient was taken to Operating Room, identified  and the procedure verified as Laparoscopic Inguinal Hernia Repair. Laterality confirmed.  A Time Out was held and the above information confirmed.   Prior to the induction of general anesthesia, antibiotic prophylaxis was administered. VTE prophylaxis was in place. General endotracheal anesthesia was then administered and tolerated well.  Dr. Glori Luis placed a foley and ICG , please see his separate operative report,.  After the induction, the abdomen was prepped with Chloraprep and draped in the sterile fashion. The patient was positioned in the supine position.     Supraumbilical incision created and cut down technique used to enter the abdominal cavity. Fascia elevated and incised and hasson trochar placed. Pneumoperitoneum obtained w/o HD changes. No evidence of bowel injuries.  Two 8 mm placed under direct vision. The laparoscopy revealed large indirect defects. I inserted the needles and the  mesh. The robot was brought ot the table and docked in the standard fashion, no collision between arms was observed. Instruments were kept under direct view at all times. We started on the Left side were a flap was created. The sac was reduced and dissected free from adjacent structures. It was a very large sac, I used ICG but did not see the reflection indicating that I was away from the bladder and ureter.  We preserved the vas and the vessels. Once dissection was completed a large 3D mesh was placed and secured with two interrupted vicryl attached to the pubic tubercle. There was good coverage of the direct, indirect and femoral spaces. The flap was closed with v lock suture.  Second look revealed no complications or injuries.    Once assuring that hemostasis was adequate the ports were removed and a figure-of-eight 0 Vicryl suture was placed at the fascial edges. 4-0 subcuticular Monocryl was used at all skin edges. Dermabond was placed.  Patient tolerated the procedure well. There were no complications. He was taken to the recovery room in stable condition.  Please note that he had persistent large Hydrocele on the left side at the start and conclusion of the case.        's note that the hernia was on the left side and there was no hernias on the right side.  Originally my note had a typo regarding the laterality of the procedure.           Caroleen Hamman, MD, FACS

## 2020-04-03 NOTE — Interval H&P Note (Signed)
History and Physical Interval Note:  04/03/2020 7:22 AM  Neil Bailey  has presented today for surgery, with the diagnosis of Left inguinal hernia.  The various methods of treatment have been discussed with the patient and family. After consideration of risks, benefits and other options for treatment, the patient has consented to  Procedure(s): XI ROBOTIC [place], possible bilateral (Left) CYSTOSCOPY W/ ICGREEN (Left) as a surgical intervention.  The patient's history has been reviewed, patient examined, no change in status, stable for surgery.  I have reviewed the patient's chart and labs.  Questions were answered to the patient's satisfaction.     [place]

## 2020-04-03 NOTE — H&P (Signed)
UROLOGY H&P UPDATE  Agree with prior H&P dated 03/18/2020 by Dr. Dahlia Byes.  69 year old male with left inguinal hernia containing large portion of the bladder.  Urology was requested to place ICG in the left ureter for intraoperative identification.  Cardiac: RRR Lungs: CTA bilaterally  Laterality: Left Procedure: Cystoscopy, injection of left ureteral ICG, robotic left inguinal hernia repair with general surgery   Informed consent obtained, we specifically discussed the risks of bleeding, infection, post-operative pain, need for additional procedures, bladder injury, ureteral injury, possible need for catheter placement, recurrence.  [name], MD 04/03/2020

## 2020-04-03 NOTE — Discharge Instructions (Addendum)
Bupivacaine Liposomal Suspension for Injection °What is this medicine? °BUPIVACAINE LIPOSOMAL (bue PIV a kane LIP oh som al) is an anesthetic. It causes loss of feeling in the skin or other tissues. It is used to prevent and to treat pain from some procedures. °This medicine may be used for other purposes; ask your health care provider or pharmacist if you have questions. °COMMON BRAND NAME(S): EXPAREL °What should I tell my health care provider before I take this medicine? °They need to know if you have any of these conditions: °· G6PD deficiency °· heart disease °· kidney disease °· liver disease °· low blood pressure °· lung or breathing disease, like asthma °· an unusual or allergic reaction to bupivacaine, other medicines, foods, dyes, or preservatives °· pregnant or trying to get pregnant °· breast-feeding °How should I use this medicine? °This medicine is for injection into the affected area. It is given by a health care professional in a hospital or clinic setting. °Talk to your pediatrician regarding the use of this medicine in children. Special care may be needed. °Overdosage: If you think you have taken too much of this medicine contact a poison control center or emergency room at once. °NOTE: This medicine is only for you. Do not share this medicine with others. °What if I miss a dose? °This does not apply. °What may interact with this medicine? °This medicine may interact with the following medications: °· acetaminophen °· certain antibiotics like dapsone, nitrofurantoin, aminosalicylic acid, sulfonamides °· certain medicines for seizures like phenobarbital, phenytoin, valproic acid °· chloroquine °· cyclophosphamide °· flutamide °· hydroxyurea °· ifosfamide °· metoclopramide °· nitric oxide °· nitroglycerin °· nitroprusside °· nitrous oxide °· other local anesthetics like lidocaine, pramoxine, tetracaine °· primaquine °· quinine °· rasburicase °· sulfasalazine °This list may not describe all possible  interactions. Give your health care provider a list of all the medicines, herbs, non-prescription drugs, or dietary supplements you use. Also tell them if you smoke, drink alcohol, or use illegal drugs. Some items may interact with your medicine. °What should I watch for while using this medicine? °Your condition will be monitored carefully while you are receiving this medicine. °Be careful to avoid injury while the area is numb, and you are not aware of pain. °What side effects may I notice from receiving this medicine? °Side effects that you should report to your doctor or health care professional as soon as possible: °· allergic reactions like skin rash, itching or hives, swelling of the face, lips, or tongue °· seizures °· signs and symptoms of a dangerous change in heartbeat or heart rhythm like chest pain; dizziness; fast, irregular heartbeat; palpitations; feeling faint or lightheaded; falls; breathing problems °· signs and symptoms of methemoglobinemia such as pale, gray, or blue colored skin; headache; fast heartbeat; shortness of breath; feeling faint or lightheaded, falls; tiredness °Side effects that usually do not require medical attention (report to your doctor or health care professional if they continue or are bothersome): °· anxious °· back pain °· changes in taste °· changes in vision °· constipation °· dizziness °· fever °· nausea, vomiting °This list may not describe all possible side effects. Call your doctor for medical advice about side effects. You may report side effects to FDA at 1-800-FDA-1088. °Where should I keep my medicine? °This drug is given in a hospital or clinic and will not be stored at home. °NOTE: This sheet is a summary. It may not cover all possible information. If you have questions about this   medicine, talk to your doctor, pharmacist, or health care provider. °© 2020 [employer] (2019-04-09 10:48:23) ° ° °AMBULATORY SURGERY  °DISCHARGE INSTRUCTIONS ° ° °1) The  drugs that you were given will stay in your system until tomorrow so for the next 24 hours you should not: ° °A) Drive an automobile °B) Make any legal decisions °C) Drink any alcoholic beverage ° ° °2) You may resume regular meals tomorrow.  Today it is better to start with liquids and gradually work up to solid foods. ° °You may eat anything you prefer, but it is better to start with liquids, then soup and crackers, and gradually work up to solid foods. ° ° °3) Please notify your doctor immediately if you have any unusual bleeding, trouble breathing, redness and pain at the surgery site, drainage, fever, or pain not relieved by medication. ° ° ° °4) Additional Instructions: ° ° ° ° ° ° ° °Please contact your physician with any problems or Same Day Surgery at [phone], Monday through Friday 6 am to 4 pm, or [place] at [place] Main number at [phone]. °

## 2020-04-03 NOTE — Anesthesia Postprocedure Evaluation (Signed)
Anesthesia Post Note  Patient: Neil Bailey  Procedure(s) Performed: XI ROBOTIC ASSISTED INGUINAL HERNIA REPAIR WITH MESH, possible bilateral (Left ) CYSTOSCOPY W/ ICGREEN (Left )  Patient location during evaluation: PACU Anesthesia Type: General Level of consciousness: awake and alert Pain management: pain level controlled Vital Signs Assessment: post-procedure vital signs reviewed and stable Respiratory status: spontaneous breathing and respiratory function stable Cardiovascular status: stable Anesthetic complications: no   No complications documented.   Last Vitals:  Vitals:   04/03/20 1108 04/03/20 1121  BP: (!) 108/57 101/64  Pulse:  77  Resp:  16  Temp:  (!) 35.9 C  SpO2:  96%    Last Pain:  Vitals:   04/03/20 1216  TempSrc:   PainSc: (P) 0-No pain                 Lamis Behrmann K

## 2020-04-03 NOTE — Anesthesia Preprocedure Evaluation (Signed)
Anesthesia Evaluation  Patient identified by MRN, date of birth, ID band Patient awake    Reviewed: Allergy & Precautions, NPO status , Patient's Chart, lab work & pertinent test results, reviewed documented beta blocker date and time   History of Anesthesia Complications Negative for: history of anesthetic complications  Airway Mallampati: III       Dental   Pulmonary neg sleep apnea, neg COPD, Not current smoker, former smoker,           Cardiovascular hypertension, Pt. on medications and Pt. on home beta blockers (-) Past MI and (-) CHF + dysrhythmias Atrial Fibrillation (-) Valvular Problems/Murmurs     Neuro/Psych neg Seizures    GI/Hepatic Neg liver ROS, neg GERD  ,  Endo/Other  neg diabetes  Renal/GU negative Renal ROS     Musculoskeletal   Abdominal   Peds  Hematology   Anesthesia Other Findings   Reproductive/Obstetrics                             Anesthesia Physical Anesthesia Plan  ASA: III  Anesthesia Plan: General   Post-op Pain Management:    Induction: Intravenous  PONV Risk Score and Plan: 2 and Ondansetron and Dexamethasone  Airway Management Planned: Oral ETT  Additional Equipment:   Intra-op Plan:   Post-operative Plan:   Informed Consent: I have reviewed the patients History and Physical, chart, labs and discussed the procedure including the risks, benefits and alternatives for the proposed anesthesia with the patient or authorized representative who has indicated his/her understanding and acceptance.       Plan Discussed with:   Anesthesia Plan Comments:         Anesthesia Quick Evaluation

## 2020-04-06 ENCOUNTER — Encounter: Payer: Self-pay | Admitting: Surgery

## 2020-04-08 ENCOUNTER — Other Ambulatory Visit: Payer: Medicare HMO

## 2020-04-22 ENCOUNTER — Ambulatory Visit (INDEPENDENT_AMBULATORY_CARE_PROVIDER_SITE_OTHER): Payer: Medicare HMO | Admitting: Surgery

## 2020-04-22 ENCOUNTER — Encounter: Payer: Self-pay | Admitting: Surgery

## 2020-04-22 ENCOUNTER — Other Ambulatory Visit: Payer: Self-pay

## 2020-04-22 VITALS — BP 137/81 | HR 74 | Temp 97.9°F | Ht 69.0 in | Wt 277.2 lb

## 2020-04-22 DIAGNOSIS — Z09 Encounter for follow-up examination after completed treatment for conditions other than malignant neoplasm: Secondary | ICD-10-CM

## 2020-04-22 NOTE — Patient Instructions (Addendum)
Follow-up with our office as needed.Please call and ask to speak with a nurse if you develop questions or concerns.  GENERAL POST-OPERATIVE PATIENT INSTRUCTIONS   WOUND CARE INSTRUCTIONS:  Keep a dry clean dressing on the wound if there is drainage. The initial bandage may be removed after 24 hours.  Once the wound has quit draining you may leave it open to air.  If clothing rubs against the wound or causes irritation and the wound is not draining you may cover it with a dry dressing during the daytime.  Try to keep the wound dry and avoid ointments on the wound unless directed to do so.  If the wound becomes bright red and painful or starts to drain infected material that is not clear, please contact your physician immediately.  If the wound is mildly pink and has a thick firm ridge underneath it, this is normal, and is referred to as a healing ridge.  This will resolve over the next 4-6 weeks.  BATHING: You may shower if you have been informed of this by your surgeon. However, Please do not submerge in a tub, hot tub, or pool until incisions are completely sealed or have been told by your surgeon that you may do so.  DIET:  You may eat any foods that you can tolerate.  It is a good idea to eat a high fiber diet and take in plenty of fluids to prevent constipation.  If you do become constipated you may want to take a mild laxative or take ducolax tablets on a daily basis until your bowel habits are regular.  Constipation can be very uncomfortable, along with straining, after recent surgery.  ACTIVITY:  You are encouraged to cough and deep breath or use your incentive spirometer if you were given one, every 15-30 minutes when awake.  This will help prevent respiratory complications and low grade fevers post-operatively if you had a general anesthetic.  You may want to hug a pillow when coughing and sneezing to add additional support to the surgical area, if you had abdominal or chest surgery, which will  decrease pain during these times.  You are encouraged to walk and engage in light activity for the next two weeks.  You should not lift more than 20 pounds, until 4 to 6 weeks after surgery as it could put you at increased risk for complications.  Twenty pounds is roughly equivalent to a plastic bag of groceries. At that time- Listen to your body when lifting, if you have pain when lifting, stop and then try again in a few days. Soreness after doing exercises or activities of daily living is normal as you get back in to your normal routine.  MEDICATIONS:  Try to take narcotic medications and anti-inflammatory medications, such as tylenol, ibuprofen, naprosyn, etc., with food.  This will minimize stomach upset from the medication.  Should you develop nausea and vomiting from the pain medication, or develop a rash, please discontinue the medication and contact your physician.  You should not drive, make important decisions, or operate machinery when taking narcotic pain medication.  SUNBLOCK Use sun block to incision area over the next year if this area will be exposed to sun. This helps decrease scarring and will allow you avoid a permanent darkened area over your incision.  QUESTIONS:  Please feel free to call our office if you have any questions, and we will be glad to assist you. [phone].    

## 2020-04-27 NOTE — Progress Notes (Signed)
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 Hydrocele on the left had significantly improved after surgery He is doing very well and has no complaints. He is urinating better   PE NAD Abd: soft, nt, incisions c/d/i, no recurrence. Right hydrocele large, left small hydrocele.  A/P Doing well No complications RTC prn

## 2020-05-28 ENCOUNTER — Other Ambulatory Visit (HOSPITAL_COMMUNITY): Payer: Self-pay | Admitting: Internal Medicine

## 2020-05-28 ENCOUNTER — Other Ambulatory Visit: Payer: Self-pay | Admitting: Internal Medicine

## 2020-05-28 DIAGNOSIS — N431 Infected hydrocele: Secondary | ICD-10-CM

## 2020-05-28 DIAGNOSIS — R1900 Intra-abdominal and pelvic swelling, mass and lump, unspecified site: Secondary | ICD-10-CM

## 2020-06-02 ENCOUNTER — Ambulatory Visit: Payer: Medicare HMO | Admitting: Urology

## 2020-06-02 ENCOUNTER — Other Ambulatory Visit: Payer: Self-pay | Admitting: Internal Medicine

## 2020-06-02 ENCOUNTER — Encounter: Payer: Self-pay | Admitting: Urology

## 2020-06-02 ENCOUNTER — Other Ambulatory Visit: Payer: Self-pay

## 2020-06-02 VITALS — BP 117/73 | HR 105 | Ht 69.0 in | Wt 277.0 lb

## 2020-06-02 DIAGNOSIS — N5089 Other specified disorders of the male genital organs: Secondary | ICD-10-CM

## 2020-06-02 DIAGNOSIS — N2889 Other specified disorders of kidney and ureter: Secondary | ICD-10-CM

## 2020-06-02 DIAGNOSIS — N289 Disorder of kidney and ureter, unspecified: Secondary | ICD-10-CM

## 2020-06-02 NOTE — Progress Notes (Signed)
   06/02/2020 2:13 PM   Neil Bailey [birth year] [id number]  Reason for visit: Scrotal swelling  HPI: I saw Neil Bailey in urology clinic for follow-up of scrotal swelling.  He is a 69 year old male with morbid obesity and a BMI of 41 who I originally met in August 2021 for scrotal swelling, worse on the left side, as well as some mild urinary symptoms of urgency and frequency.  His history was notable for a right-sided inguinal hernia repair in 2016 with Dr. 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  A CT on 03/10/2020  showed herniation of the bladder into the left scrotum, and he was evaluated by general surgery and ultimately underwent robotic left inguinal hernia repair with Dr. Dahlia Byes on 04/03/2020.  At the time of surgery I was asked to place ICG in the left ureter to aid in intraoperative identification for Dr. Candis Schatz repair.  Per Dr. Candis Schatz note, the hernia sac was reduced and dissected free and mesh was used to repair the defect.  He developed a UTI post-op with dysuria and fatigue, and urine culture grew E. coli on 04/20/2020, and most recently on 05/25/2020 and was treated with culture appropriate Cipro by his PCP.  He denies any urinary symptoms today, and reports that all his urinary problems have resolved since being treated with antibiotics.  He is having persistent bothersome scrotal swelling, again still worse on the left side.  On exam he has a right-sided moderate hydrocele, and significant left-sided swelling still.  There is no significant scrotal tenderness, no skin changes, no evidence of scrotal abscess or infection.  There was no evidence of a left-sided hydrocele at the time of his CT in August 2021.  I personally viewed and interpreted the images.  We discussed that I do not have a good explanation for his persistent left-sided swelling after surgery, as I would have anticipated this would have improved significantly after hernia repair with  removal of the bladder from the scrotum by Dr. Perrin Maltese.  Regarding his right sided scrotal swelling, we reviewed hydroceles at length, and treatment options including observation, aspiration, or hydrocelectomy.  He is very bothered by the persistent bilateral scrotal swelling, but worse on the left side.  I recommended a scrotal ultrasound for further evaluation, and we reviewed possible etiologies.  He also is getting an MRI tomorrow for further evaluation of a 3.5 cm renal lesion seen on prior CT without contrast.  We will follow up MRI and scrotal ultrasound results and contact him with those findings to determine next steps in care  Neil Bailey, [place] [address] [place], [place] [address] [phone]

## 2020-06-02 NOTE — Patient Instructions (Signed)
Hydrocele, Adult  A hydrocele is a collection of fluid in the loose pouch of skin that holds the testicles (scrotum). This may happen because:  The amount of fluid produced in the scrotum is not absorbed by the rest of the body.  Fluid from the abdomen fills the scrotum. Normally, the testicles develop in the abdomen then move (drop) into to the scrotum before birth. The tube that the testicles travel through usually closes after the testicles drop. If the tube does not close, fluid from the abdomen can fill the scrotum. This is less common in adults.  What are the causes?  The cause of a hydrocele in adults is usually not known. However, it may be caused by:  An injury to the scrotum.  An infection (epididymitis).  Decreased blood flow to the scrotum.  Twisting of a testicle (testicular torsion).  A birth defect.  A tumor or cancer of the testicle.  What are the signs or symptoms?  A hydrocele feels like a water-filled balloon. It may also feel heavy. Other symptoms include:  Swelling of the scrotum. The swelling may decrease when you lie down. You may also notice more swelling at night than in the morning.  Swelling of the groin.  Mild discomfort in the scrotum.  Pain. This can develop if the hydrocele was caused by infection or twisting. The larger the hydrocele, the more likely you are to have pain.  How is this diagnosed?  This condition may be diagnosed based on:  Physical exam.  Medical history.  You may also have other tests, including:  Imaging tests, such as ultrasound.  Blood or urine tests.  How is this treated?  Most hydroceles go away on their own. If you have no discomfort or pain, your health care provider may suggest close monitoring of your condition (called watch and wait or watchful waiting) until the condition goes away or symptoms develop. If treatment is needed, it may include:  Treating an underlying condition. This may include using an antibiotic medicine to treat an infection.  Surgery to  stop fluid from collecting in the scrotum.  Surgery to drain the fluid. Options include:  Needle aspiration. A needle is used to drain fluid. However, the fluid buildup will come back quickly.  Hydrocelectomy. For this procedure, an incision is made in the scrotum to remove the fluid sac.  Follow these instructions at home:  Watch the hydrocele for any changes.  Take over-the-counter and prescription medicines only as told by your health care provider.  If you were prescribed an antibiotic medicine, use it as told by your health care provider. Do not stop taking the antibiotic even if you start to feel better.  Keep all follow-up visits as told by your health care provider. This is important.  Contact a health care provider if:  You notice any changes in the hydrocele.  The swelling in your scrotum or groin gets worse.  The hydrocele becomes red, firm, painful, or tender to the touch.  You have a fever.  Get help right away if you:  Develop a lot of pain, or your pain becomes worse.  Summary  A hydrocele is a collection of fluid in the loose pouch of skin that holds the testicles (scrotum).  Hydroceles can cause swelling, discomfort, and sometimes pain.  In adults, the cause of a hydrocele usually is not known. However, it is sometimes caused by an infection or a rotation and twisting of the scrotum.  Treatment is usually not   may be given to ease the pain. This information is not intended to replace advice given to you by your health care provider. Make sure you discuss any questions you have with your health care provider. Document Revised: 07/08/2017 Document Reviewed: 07/08/2017 Elsevier Patient Education  [place], Adult  A hydrocelectomy is a surgical procedure to remove a collection of fluid (hydrocele) from the scrotum,  which is the pouch that holds the testicles. You may need to have this procedure if a hydrocele is causing painful swelling in your scrotum. Tell a health care provider about:  Any allergies you have.  All medicines you are taking, including vitamins, herbs, eye drops, creams, and over-the-counter medicines.  Any problems you or family members have had with anesthetic medicines.  Any blood disorders you have.  Any surgeries you have had.  Any medical conditions you have. What are the risks? Generally, this is a safe procedure. However, problems may occur, including:  Bleeding into the scrotum (scrotal hematoma).  Damage to nearby structures or organs, including to the testicle or the tube that carries sperm out of the testicle (vas deferens).  Infection.  Allergic reactions to medicines. What happens before the procedure? Staying hydrated Follow instructions from your health care provider about hydration, which may include:  Up to 2 hours before the procedure - you may continue to drink clear liquids, such as water, clear fruit juice, black coffee, and plain tea. Eating and drinking restrictions Follow instructions from your health care provider about eating and drinking, which may include:  8 hours before the procedure - stop eating heavy meals or foods, such as meat, fried foods, or fatty foods.  6 hours before the procedure - stop eating light meals or foods, such as toast or cereal.  6 hours before the procedure - stop drinking milk or drinks that contain milk.  2 hours before the procedure - stop drinking clear liquids. Medicines Ask your health care provider about:  Changing or stopping your regular medicines. This is especially important if you are taking diabetes medicines or blood thinners.  Taking medicines such as aspirin and ibuprofen. These medicines can thin your blood. Do not take these medicines unless your health care provider tells you to take  them.  Taking over-the-counter medicines, vitamins, herbs, and supplements. General instructions  Do not use any products that contain nicotine or tobacco for at least 4 weeks before the procedure. These products include cigarettes, e-cigarettes, and chewing tobacco. If you need help quitting, ask your health care provider.  Plan to have someone take you home from the hospital or clinic.  Plan to have a responsible adult care for you for at least 24 hours after you leave the hospital or clinic. This is important.  Ask your health care provider: ? How your surgery site will be marked. ? What steps will be taken to help prevent infection. These may include:  Removing hair at the surgery site.  Washing skin with a germ-killing soap.  Taking antibiotic medicine. What happens during the procedure?  An IV will be inserted into one of your veins.  You will be given one or more of the following: ? A medicine to make you relax (sedative). ? A medicine to make you fall asleep (general anesthetic).  A small incision will be made through the skin of your scrotum.  Your testicle and the hydrocele will be located, and the hydrocele sac will be opened with an incision.  The  fluid will be drained from the hydrocele. Part of the hydrocele sac may be removed.  The hydrocele will be closed with stitches that dissolve (absorbable sutures). This prevents fluid from building up again.  If your hydrocele is large, you may have a thin, rubber drain placed to allow fluid to drain after the procedure.  The incision in your scrotum will be closed with absorbable sutures, skin glue, or adhesives.  A bandage (dressing) will be placed over the incision. The dressing may be held in place with an athletic support strap (scrotal support). The procedure may vary among health care providers and hospitals. What happens after the procedure?   Your blood pressure, heart rate, breathing rate, and blood oxygen  level will be monitored until you leave the hospital or clinic.  You will be given pain medicine as needed.  Your IV will be removed, and your insertion site will be checked for bleeding.  Do not drive for 24 hours if you were given a sedative during your procedure.  You may need to wear a scrotal support. This holds the dressing in place and supports your scrotum. Summary  A hydrocelectomy is a surgical procedure to remove a collection of fluid (hydrocele) from the scrotum, which is the pouch that holds the testicles. You may need to have this procedure if a hydrocele is causing painful swelling in your scrotum.  During the procedure, the hydrocele will be drained and then closed with stitches that dissolve (absorbable sutures). This prevents fluid from building up again.  If your hydrocele is large, you may have a thin, rubber drain placed to allow fluid to drain after the procedure.  You may need to wear a scrotal support after your procedure. This holds the dressing in place and supports your scrotum. This information is not intended to replace advice given to you by your health care provider. Make sure you discuss any questions you have with your health care provider. Document Revised: 11/20/2018 Document Reviewed: 11/20/2018 Elsevier Patient Education  [place].

## 2020-06-03 ENCOUNTER — Ambulatory Visit (HOSPITAL_COMMUNITY)
Admission: RE | Admit: 2020-06-03 | Discharge: 2020-06-03 | Disposition: A | Discharge status: Home / Self Care | Payer: Medicare HMO | Source: Ambulatory Visit | Attending: Urology | Admitting: Urology

## 2020-06-03 ENCOUNTER — Ambulatory Visit (HOSPITAL_COMMUNITY)
Admission: RE | Admit: 2020-06-03 | Discharge: 2020-06-03 | Disposition: A | Discharge status: Home / Self Care | Payer: Medicare HMO | Source: Ambulatory Visit | Attending: Internal Medicine | Admitting: Internal Medicine

## 2020-06-03 DIAGNOSIS — N2889 Other specified disorders of kidney and ureter: Secondary | ICD-10-CM | POA: Diagnosis present

## 2020-06-03 DIAGNOSIS — N5089 Other specified disorders of the male genital organs: Secondary | ICD-10-CM | POA: Insufficient documentation

## 2020-06-03 IMAGING — MR MR ABDOMEN WO/W CM
18 series · 48 of 48 positions shown · IV contrast (gadavist)
Comparison: CT scan [DATE] and [DATE]

CLINICAL DATA: Follow-up right renal lesions seen on recent CT
scan.

EXAM:
MRI ABDOMEN WITHOUT AND WITH CONTRAST
TECHNIQUE: Multiplanar multisequence MR imaging of the abdomen was performed
both before and after the administration of intravenous contrast.
CONTRAST:  10mL GADAVIST GADOBUTROL 1 MMOL/ML IV SOLN

[Series 2: T2 · coronal · 6.0mm · 1.56mm/px · 3 of 45 slices shown (1 of 2)]
[im 1/45]
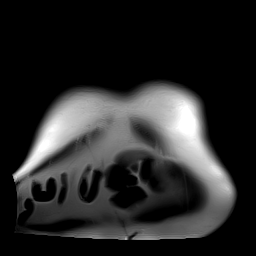
[im 23/45]
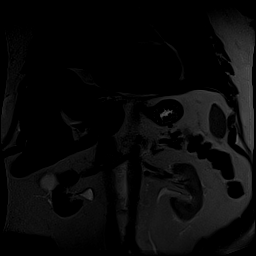
[im 45/45]
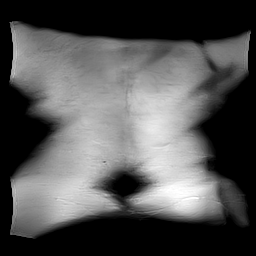

[Series 4: T2 fat-sat · axial · 6.0mm · 1.25mm/px · z∈[-164,+117]mm · 2 of 40 slices shown]
[im 1/40]
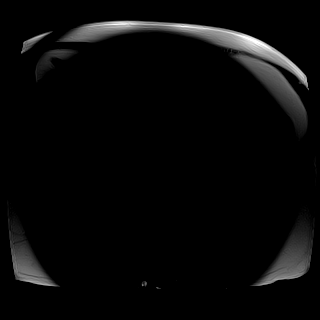
[im 40/40]
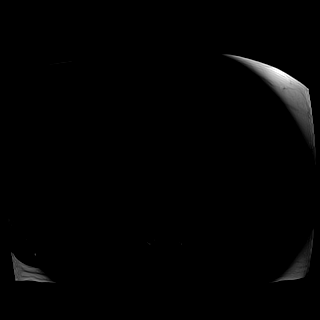

[Series 5: T1 · axial · 3.5mm · 1.25mm/px · z∈[-189,+88]mm · 3 of 80 slices shown (1 of 2)]
[im 1/80]
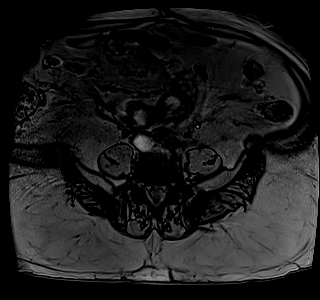
[im 40/80]
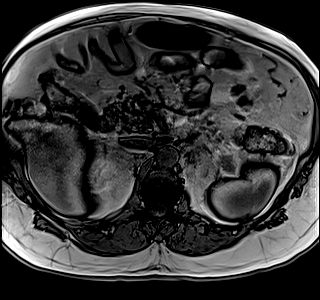
[im 80/80]
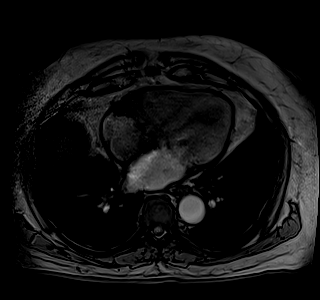

[Series 6: T1 · axial · 3.5mm · 1.25mm/px · z∈[-189,+88]mm · 3 of 80 slices shown (2 of 2)]
[im 1/80]
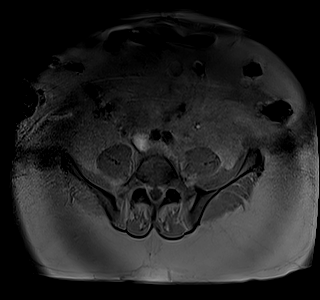
[im 40/80]
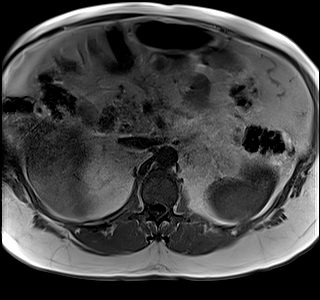
[im 80/80]
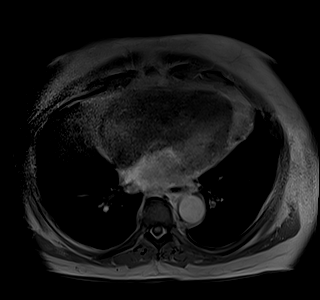

[Series 7: DWI · axial · 6.0mm · 1.49mm/px · z∈[-186,+95]mm · 3 of 80 slices shown (1 of 2)]
[im 1/80]
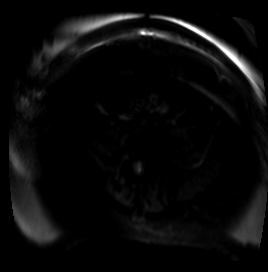
[im 40/80]
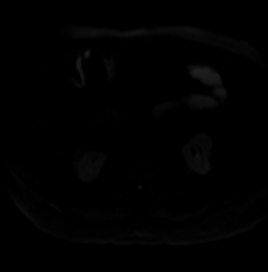
[im 80/80]
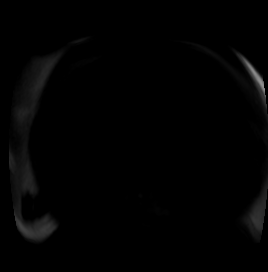

[Series 8: DWI · axial · 6.0mm · 1.49mm/px · 1 of 40 slices shown (2 of 2)]
[im 1/40]
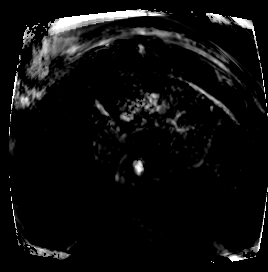

[Series 9: bSSFP · axial · 5.0mm · 0.84mm/px · z∈[-187,+104]mm · 2 of 54 slices shown]
[im 1/54]
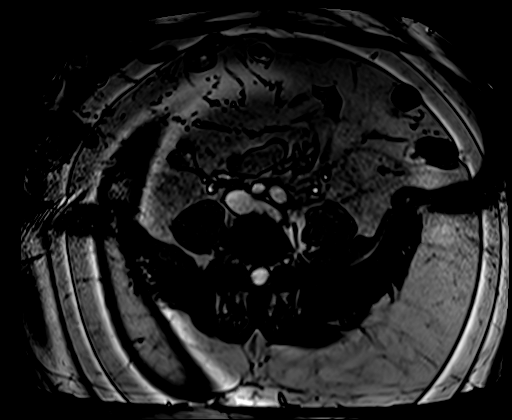
[im 54/54]
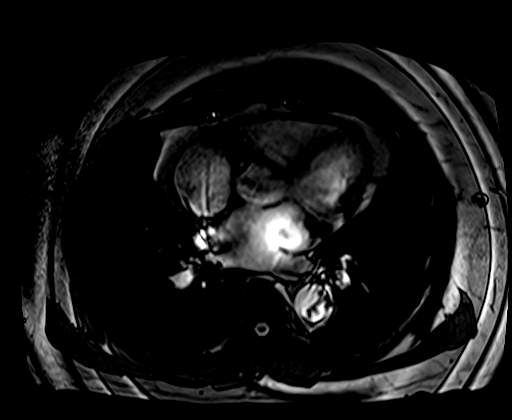

[Series 11: T1 dynamic · axial · 3.0mm · 1.25mm/px · z∈[-194,+91]mm · 3 of 96 slices shown (1 of 6)]
[im 1/96]
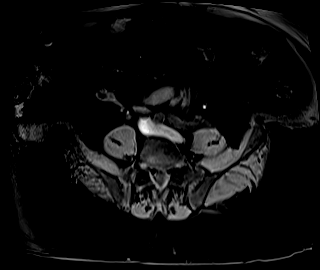
[im 48/96]
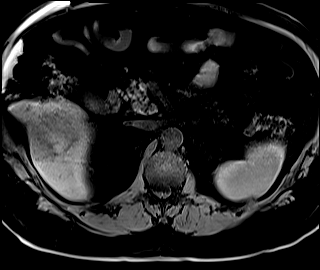
[im 96/96]
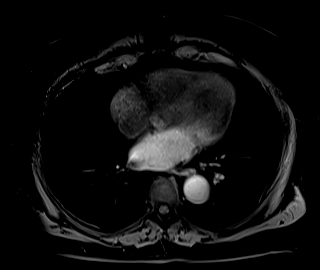

[Series 14: T1 dynamic · axial · 3.0mm · 1.25mm/px · z∈[-194,+91]mm · 3 of 96 slices shown (2 of 6)]
[im 1/96]
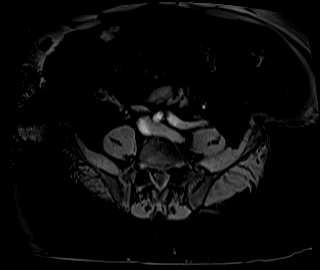
[im 48/96]
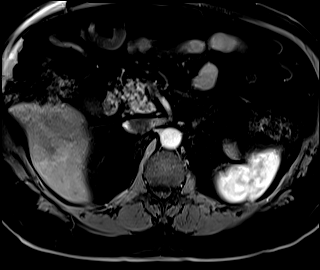
[im 96/96]
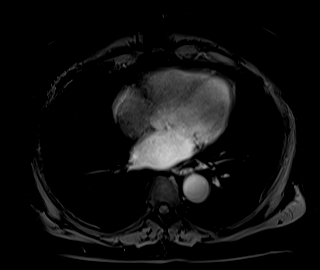

[Series 16: T1 dynamic · axial · 3.0mm · 1.25mm/px · z∈[-194,+91]mm · 3 of 96 slices shown (3 of 6)]
[im 1/96]
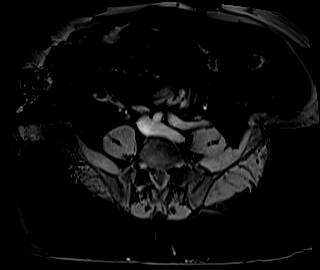
[im 48/96]
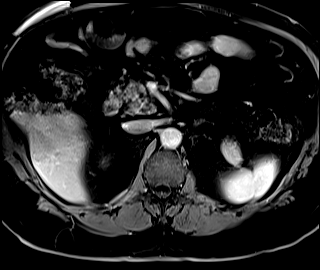
[im 96/96]
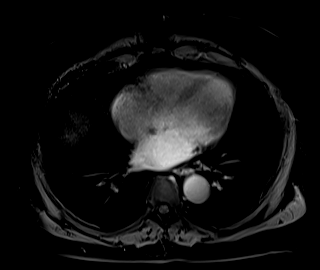

[Series 18: T1 dynamic · axial · 3.0mm · 1.25mm/px · z∈[-194,+91]mm · 3 of 96 slices shown (4 of 6)]
[im 1/96]
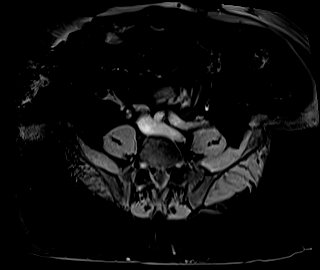
[im 48/96]
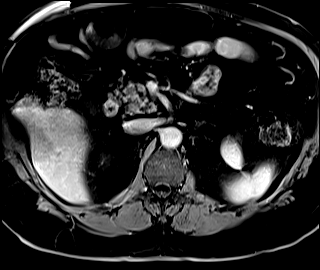
[im 96/96]
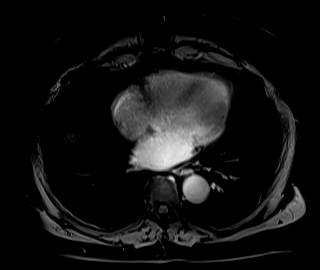

[Series 20: T1 dynamic · coronal · 4.0mm · 1.41mm/px · 2 of 72 slices shown (5 of 6)]
[im 1/72]
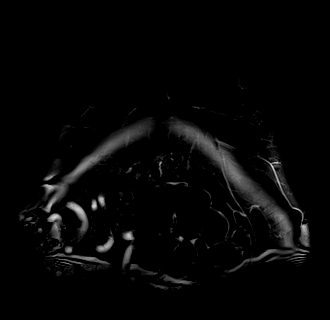
[im 72/72]
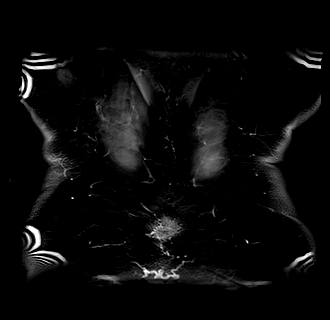

[Series 21: T2 · axial · 6.0mm · 1.56mm/px · z∈[-203,+106]mm · 2 of 44 slices shown (2 of 2)]
[im 1/44]
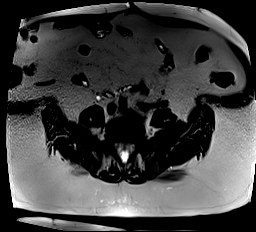
[im 44/44]
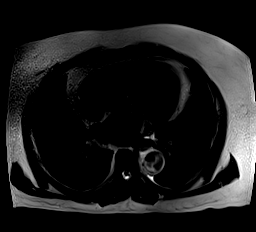

[Series 23: T1 dynamic · axial · 3.0mm · 1.25mm/px · z∈[-194,+91]mm · 3 of 96 slices shown (6 of 6)]
[im 1/96]
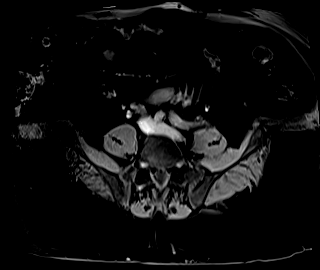
[im 48/96]
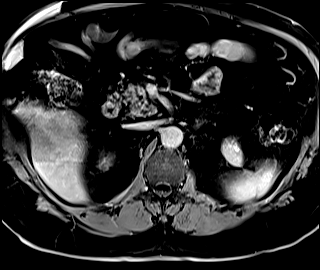
[im 96/96]
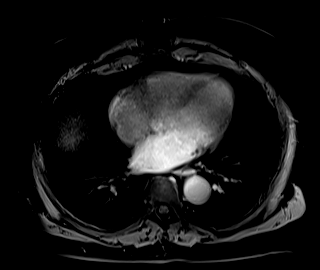

[Series 100: sub 20 sec · axial · 3.0mm · 1.25mm/px · z∈[-194,+91]mm · 3 of 96 slices shown]
[im 1/96]
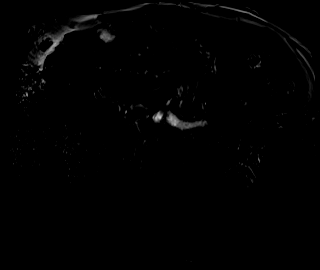
[im 48/96]
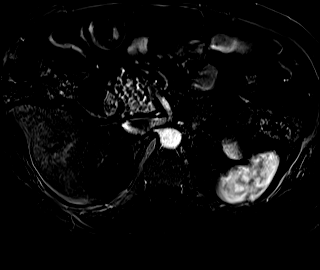
[im 96/96]
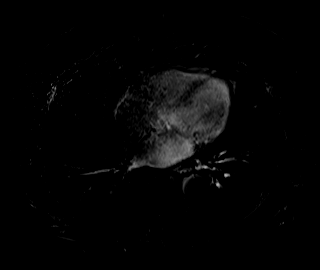

[Series 101: sub 45 sec · axial · 3.0mm · 1.25mm/px · z∈[-194,+91]mm · 3 of 96 slices shown]
[im 1/96]
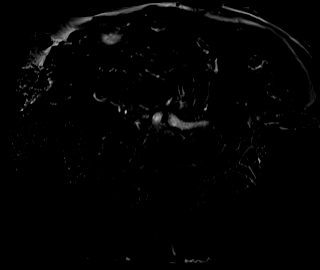
[im 48/96]
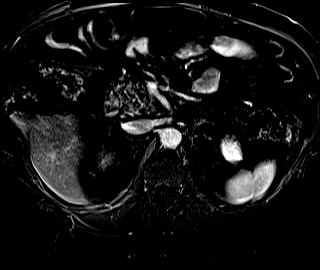
[im 96/96]
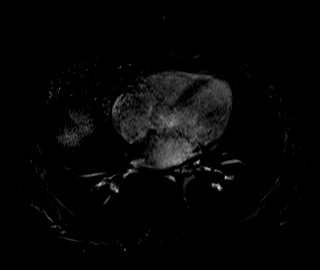

[Series 102: sub 90 sec · axial · 3.0mm · 1.25mm/px · z∈[-194,+91]mm · 3 of 96 slices shown]
[im 1/96]
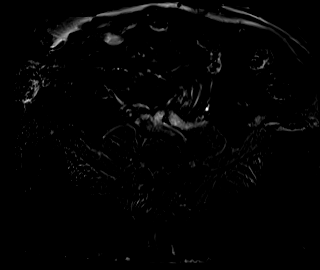
[im 48/96]
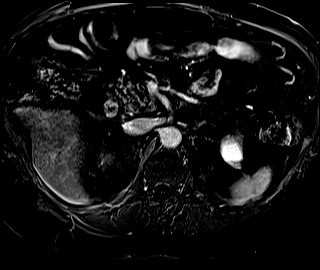
[im 96/96]
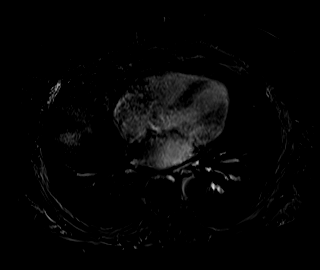

[Series 103: sub 3 min · axial · 3.0mm · 1.25mm/px · z∈[-194,+91]mm · 3 of 96 slices shown]
[im 1/96]
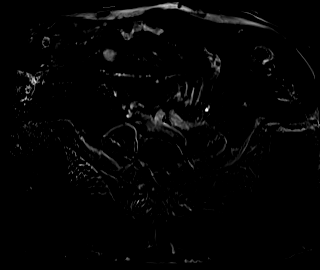
[im 48/96]
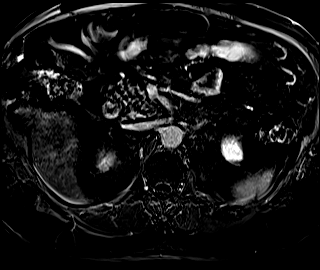
[im 96/96]
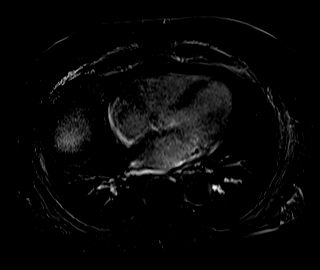

[48 of 48 positions shown; findings below may reference images not displayed]

FINDINGS: Lower chest: Streaky bibasilar atelectasis is noted. No pleural or
pericardial effusion. Tortuosity of the thoracic aorta is noted.

Hepatobiliary: No hepatic lesions or intrahepatic biliary
dilatation. The gallbladder is unremarkable. No common bile duct
dilatation.

Pancreas: No mass, inflammation or ductal dilatation. Tiny, 1-2 mm
cyst noted in the pancreatic body/tail junction region most likely a
small postinflammatory cyst.

Spleen:  Normal size.  No focal lesions.

Adrenals/Urinary Tract:  The adrenal glands are unremarkable.

3.6 x 3.6 cm cyst projecting off the lateral cortex of the right
kidney in the midpole region. This has a single thin septation but
no enhancement or nodularity. Findings consistent with a benign cyst
(Bosniak 2).

No other renal lesions are identified.  No hydronephrosis.

Stomach/Bowel: The stomach, duodenum, visualized small bowel and
visualized colon are unremarkable.

Vascular/Lymphatic: The aorta and branch vessels are patent. No
aneurysm or dissection. The major venous structures are patent. No
mesenteric or retroperitoneal mass or adenopathy.

Other:  No ascites or abdominal wall hernia.

Musculoskeletal: No significant bony findings.
IMPRESSION: 1. 3.6 x 3.6 cm cyst projecting off the lateral cortex of the right
kidney in the midpole region. This has a single thin septation but
no enhancement or nodularity. Findings consistent with a benign cyst
(Bosniak 2).
2. No acute abdominal findings, mass lesions or adenopathy.

## 2020-06-03 IMAGING — US US SCROTUM W/ DOPPLER COMPLETE
1 series · 14 of 25 positions shown · non-contrast
Comparison: [DATE]

CLINICAL DATA: Chronic scrotal swelling

EXAM:
SCROTAL ULTRASOUND
DOPPLER ULTRASOUND OF THE TESTICLES
TECHNIQUE: Complete ultrasound examination of the testicles, epididymis, and
other scrotal structures was performed. Color and spectral Doppler
ultrasound were also utilized to evaluate blood flow to the
testicles.

[Series 1: us scrotum w/ doppler complete · 14 of 36 slices shown]
[im 1/36]
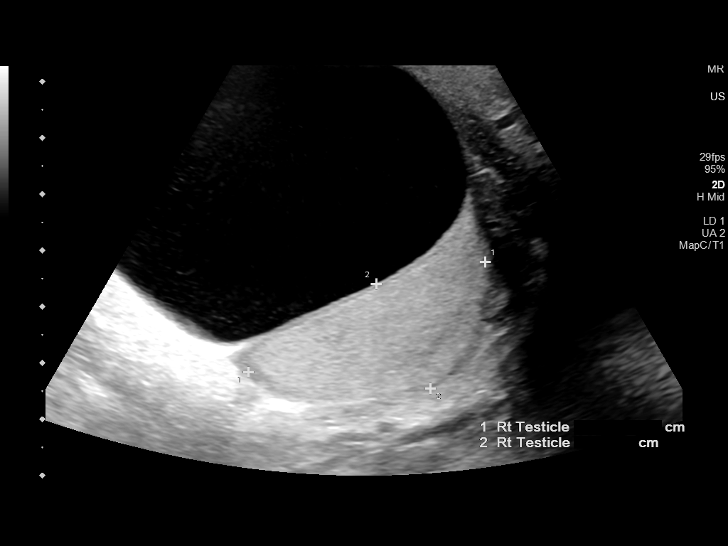
[im 3/36]
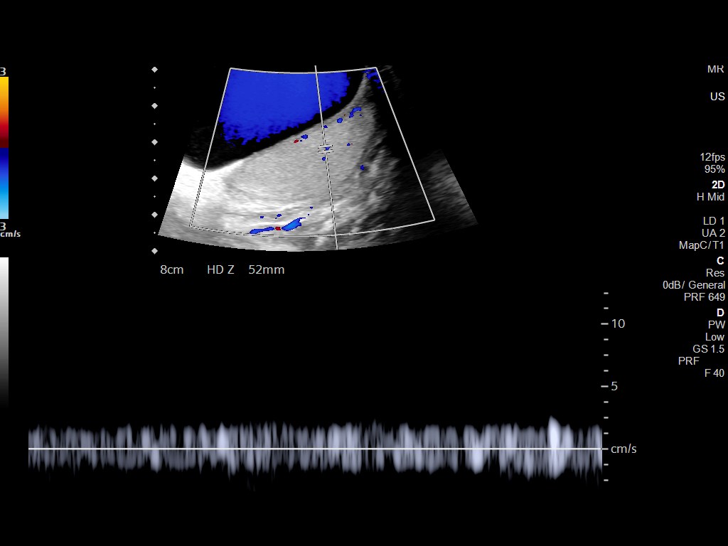
[im 6/36]
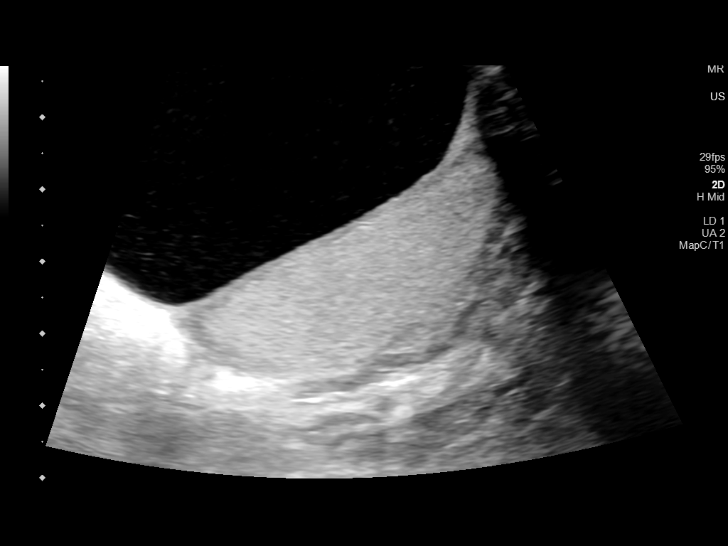
[im 9/36]
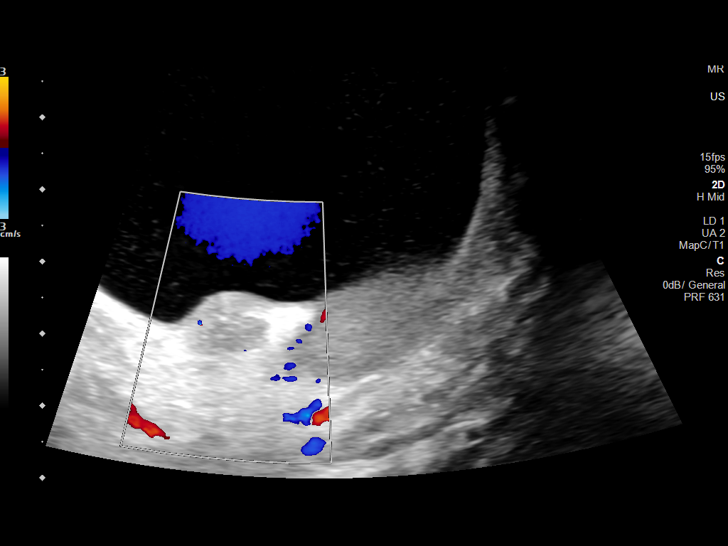
[im 12/36]
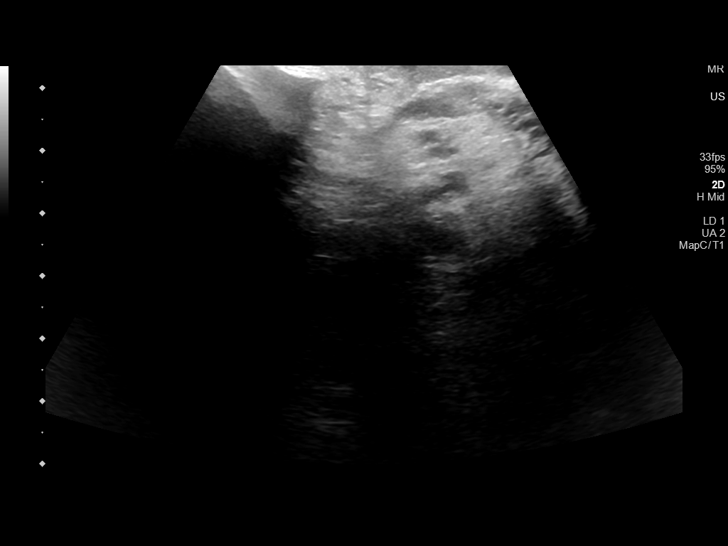
[im 14/36]
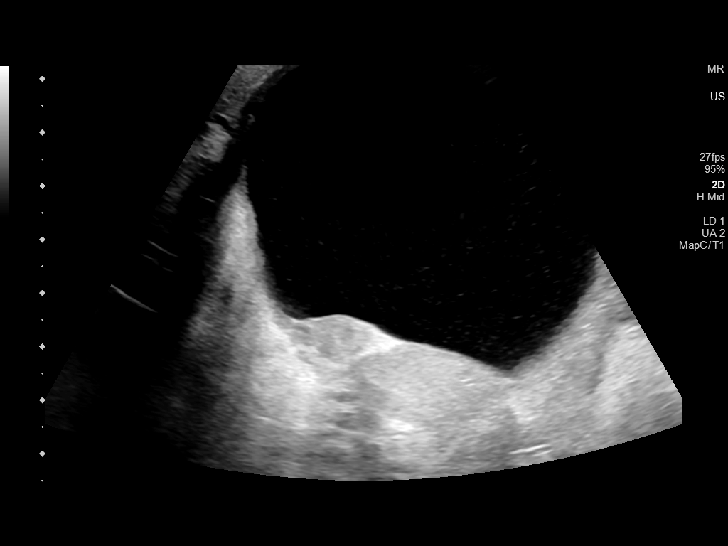
[im 17/36]
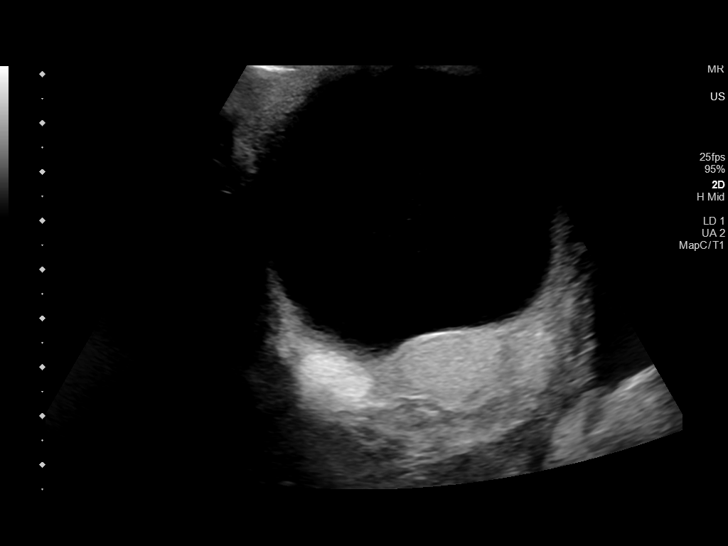
[im 19/36]
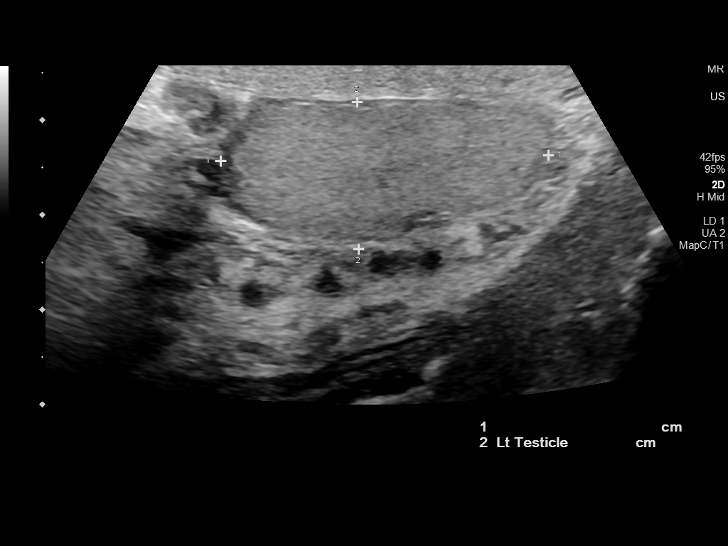
[im 22/36]
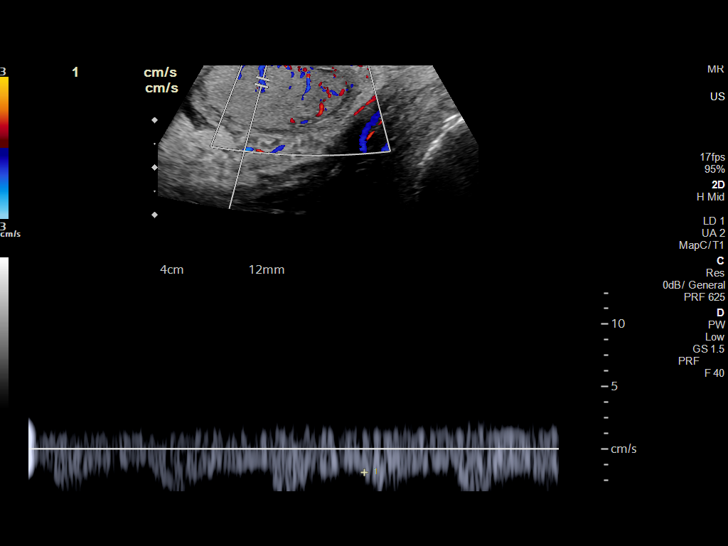
[im 24/36]
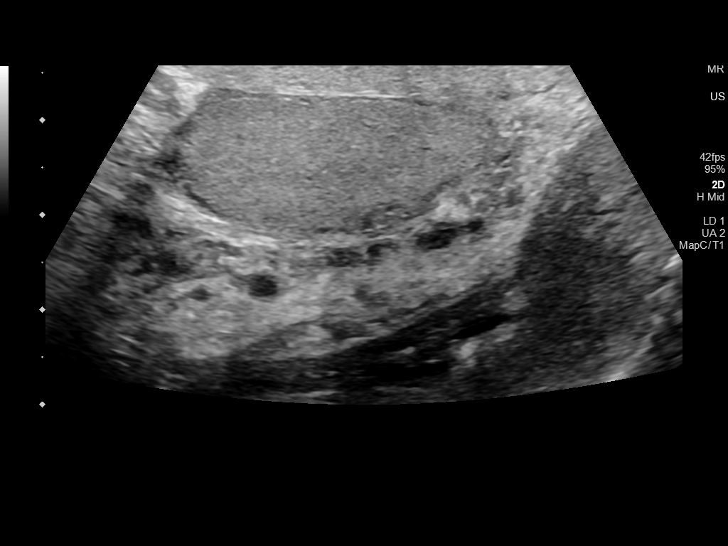
[im 27/36]
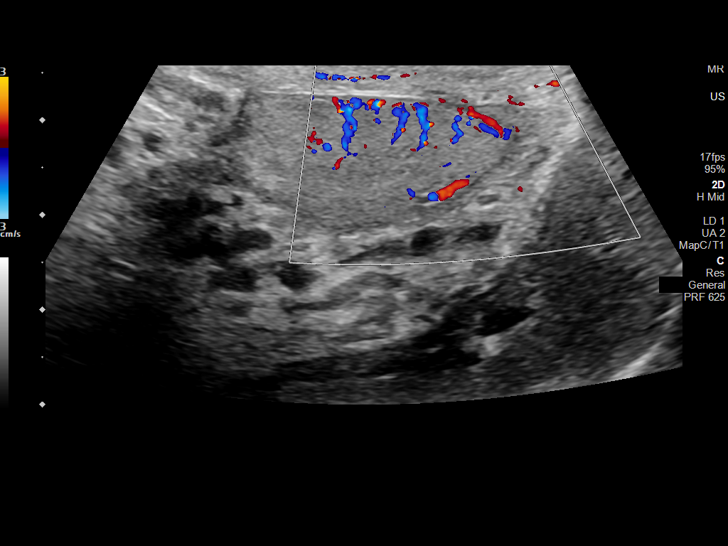
[im 30/36]
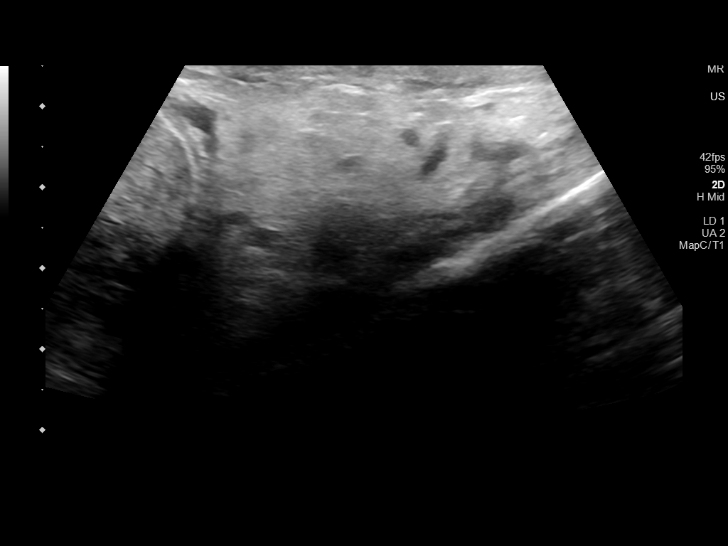
[im 33/36]
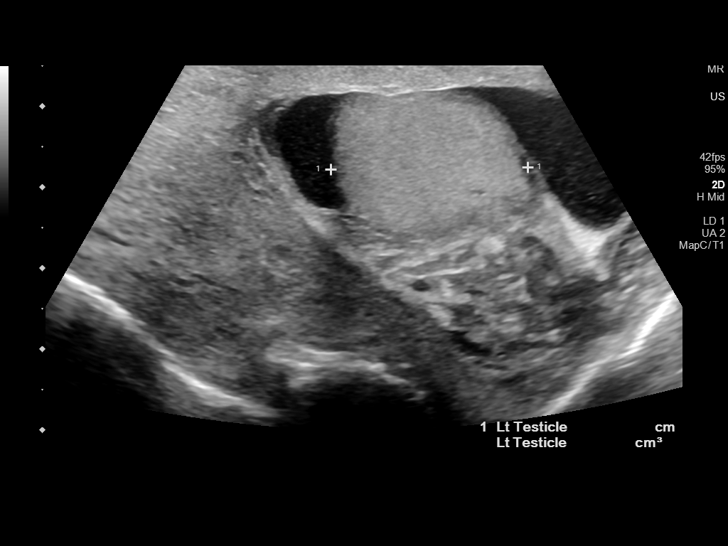
[im 36/36]
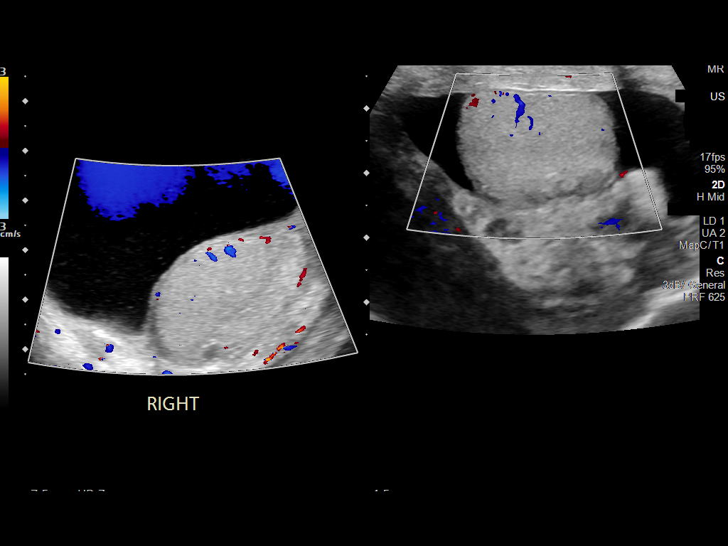

[14 of 25 positions shown; findings below may reference images not displayed]

FINDINGS: Right testicle

Measurements: 4.6 x 2.1 x 3.3 cm. No mass or microlithiasis
visualized.

Left testicle

Measurements: 3.5 x 1.6 x 2.4 cm. No mass or microlithiasis
visualized.

Right epididymis:  Normal in size and appearance.

Left epididymis:  Normal in size and appearance.

Hydrocele: Large right-sided hydrocele with scattered low level
internal echoes. Small left hydrocele.

Varicocele:  None visualized.

Pulsed Doppler interrogation of both testes demonstrates normal low
resistance arterial and venous waveforms bilaterally.
IMPRESSION: 1. Negative for testicular torsion or intratesticular mass.
2. Large right and small left hydroceles.

## 2020-06-03 MED ORDER — GADOBUTROL 1 MMOL/ML IV SOLN
10.0000 mL | Freq: Once | INTRAVENOUS | Status: AC | PRN
  Administered 2020-06-03: 10 mL via INTRAVENOUS

## 2020-06-08 ENCOUNTER — Telehealth: Payer: Self-pay | Admitting: Urology

## 2020-06-08 DIAGNOSIS — R600 Localized edema: Secondary | ICD-10-CM | POA: Insufficient documentation

## 2020-06-08 DIAGNOSIS — N281 Cyst of kidney, acquired: Secondary | ICD-10-CM | POA: Insufficient documentation

## 2020-06-08 NOTE — Telephone Encounter (Signed)
S/W pt, appt scheduled.

## 2020-06-08 NOTE — Telephone Encounter (Signed)
-----   Message from [name], MD sent at 06/03/2020  5:42 PM EST ----- Regarding: follow up Please set him up to see me in 3 to 4 weeks for symptom check, Dr. Corlis Leak office will reach out to him as well for scheduling appointment in the next 1 to 2 weeks regarding his persistent swelling  Neil Madrid, MD 06/03/2020

## 2020-06-23 DIAGNOSIS — R0602 Shortness of breath: Secondary | ICD-10-CM | POA: Insufficient documentation

## 2020-06-29 ENCOUNTER — Telehealth: Payer: Self-pay

## 2020-06-29 NOTE — Telephone Encounter (Signed)
Called pt, no answer. LM for pt informing him of the need to cancel Wednesday's appt until he has seen Dr. Dahlia Byes. Advised pt to call back.

## 2020-06-30 NOTE — Telephone Encounter (Signed)
Patient returned call explained need to reschedule follow up with Dr. Diamantina Providence till -re-evaluated with Dr. Dahlia Byes. Called Dr. Corlis Leak office scheduled appointment for 07/01/20-and rescheduled appointment with Dr. Diamantina Providence. Patient aware of details of both appointments. Voiced understanding.

## 2020-07-01 ENCOUNTER — Ambulatory Visit: Payer: Medicare HMO | Admitting: Urology

## 2020-07-01 ENCOUNTER — Encounter: Payer: Self-pay | Admitting: Surgery

## 2020-07-01 ENCOUNTER — Other Ambulatory Visit: Payer: Self-pay

## 2020-07-01 ENCOUNTER — Ambulatory Visit (INDEPENDENT_AMBULATORY_CARE_PROVIDER_SITE_OTHER): Payer: Medicare HMO | Admitting: Surgery

## 2020-07-01 VITALS — BP 111/71 | HR 79 | Temp 97.6°F | Ht 69.0 in | Wt 279.6 lb

## 2020-07-01 DIAGNOSIS — N433 Hydrocele, unspecified: Secondary | ICD-10-CM | POA: Diagnosis not present

## 2020-07-01 NOTE — Patient Instructions (Signed)
Please call our office if you have any questions or concerns.  

## 2020-07-03 NOTE — Progress Notes (Signed)
Outpatient Surgical Follow Up  07/03/2020  Neil Bailey is an 69 y.o. male.   No chief complaint on file.   HPI: S/p robotic left inguinal hernia repair.  He did have a baseline large right sided hydrocele and a prior open inguinal hernia repair. Comes to see me regarding hydrocele.  He had an ultrasound that I personally reviewed showing evidence of a large hydrocele on the right side and a very minimal hydrocele on the left.  Preoperatively the patient had a large right hydrocele.  Past Medical History:  Diagnosis Date  . Atrial fibrillation ([place])   . COVID-19 06/2019  . DVT (deep venous thrombosis) ([place])   . Dyspnea    since covid dec 2020  . HTN (hypertension)   . Lab test positive for detection of COVID-19 virus 06/30/2019    Past Surgical History:  Procedure Laterality Date  . COLONOSCOPY  7-8 years ago   [facility]  . CYSTOSCOPY Left 04/03/2020   Procedure: CYSTOSCOPY Birdena Crandall;  Surgeon: [name], MD;  Location: [facility] ORS;  Service: Urology;  Laterality: Left;  . EYE SURGERY  05/2014, 06/2014   cataracts  . INGUINAL HERNIA REPAIR Right 01/19/2015   Procedure: RIGHT INCARCERATED INGUINAL HERNIA REPAIR;  Surgeon: Christene Lye, MD;  Location: [facility] ORS;  Service: General;  Laterality: Right;  . TONSILLECTOMY    . XI ROBOTIC ASSISTED INGUINAL HERNIA REPAIR WITH MESH Left 04/03/2020   Procedure: XI ROBOTIC ASSISTED INGUINAL HERNIA REPAIR WITH MESH, possible bilateral;  Surgeon: Jules Husbands, MD;  Location: [facility] ORS;  Service: General;  Laterality: Left;    Family History  Problem Relation Age of Onset  . Dementia Mother     Social History:  reports that he quit smoking about 34 years ago. His smoking use included cigarettes. He has a 42.00 pack-year smoking history. He has never used smokeless tobacco. He reports current alcohol use. He reports that he does not use drugs.  Allergies:  Allergies  Allergen Reactions  . Plasma, Human Shortness Of Breath,  Anxiety, Other (See Comments), Cough and Hypertension    Redness    Medications reviewed.    ROS Full ROS performed and is otherwise negative other than what is stated in HPI   BP 111/71   Pulse 79   Temp 97.6 F (36.4 C) (Oral)   Ht 5\' 9"  (1.753 m)   Wt 279 lb 9.6 oz (126.8 kg)   SpO2 95%   BMI 41.29 kg/m   Physical Exam Vitals and nursing note reviewed. Exam conducted with a chaperone present.  Constitutional:      Appearance: Normal appearance. He is obese. He is not ill-appearing.  Pulmonary:     Effort: Pulmonary effort is normal. No respiratory distress.  Abdominal:     General: Abdomen is flat. There is no distension.     Palpations: Abdomen is soft. There is no mass.     Tenderness: There is no abdominal tenderness. There is no guarding or rebound.     Hernia: No hernia is present.     Comments: Incisions healed no evidence of infection  Genitourinary:    Penis: Normal.      Testes: Normal.     Comments: There is evidence of a large hydrocele on the right side that seems to be tender.  There is a very minimal hydrocele on the left side. Musculoskeletal:     Cervical back: Normal range of motion and neck supple. No rigidity or tenderness.  Skin:  Capillary Refill: Capillary refill takes less than 2 seconds.  Neurological:     General: No focal deficit present.     Mental Status: He is alert and oriented to person, place, and time.  Psychiatric:        Mood and Affect: Mood normal.        Behavior: Behavior normal.        Thought Content: Thought content normal.        Judgment: Judgment normal.     Assessment/Plan: Right symptomatic hydrocele.  I would not expect that this hydrocele on the right side would have improve after inguinal hernia repair in the left.  The left side has a very minimal asymptomatic hydrocele.  I suggest evaluation by Drs. SNnisky and I think he is interested in excision of hydrocele. Has no issues related to the prior left  robotic hernia repair      Greater than 50% of the 25 minutes  visit was spent in counseling/coordination of care   Caroleen Hamman, MD [facility]

## 2020-07-08 ENCOUNTER — Ambulatory Visit: Payer: Medicare HMO | Admitting: Urology

## 2020-07-13 ENCOUNTER — Ambulatory Visit: Payer: Self-pay | Admitting: Urology

## 2020-07-15 ENCOUNTER — Other Ambulatory Visit: Payer: Self-pay

## 2020-07-15 ENCOUNTER — Ambulatory Visit (INDEPENDENT_AMBULATORY_CARE_PROVIDER_SITE_OTHER): Payer: Medicare HMO | Admitting: Urology

## 2020-07-15 ENCOUNTER — Encounter: Payer: Self-pay | Admitting: Urology

## 2020-07-15 VITALS — BP 98/68 | HR 111 | Ht 69.0 in | Wt 279.0 lb

## 2020-07-15 DIAGNOSIS — N432 Other hydrocele: Secondary | ICD-10-CM | POA: Diagnosis not present

## 2020-07-15 NOTE — Progress Notes (Signed)
   07/15/2020 3:56 PM   Gwin Piccini 01/03/1951 [id number]  Reason for visit: Follow up scrotal swelling, renal cyst  HPI: I saw Mr. Neil Bailey back in urology clinic for scrotal swelling.  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  This was ultimately repaired robotically by Dr. Pabon.  He had a prolonged recovery where it took at least 3 months for the left sided swelling to improve after surgery, but it has almost completely resolved and he is doing well.  His primary complaint today is bothersome right-sided scrotal swelling from a large hydrocele which has been confirmed on scrotal ultrasound.  He is interested in hydrocelectomy.  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  I personally viewed and interpreted the MRI images and agree with these findings.  On exam, the left scrotal swelling has decreased from our last visit, he has persistent large right hydrocele stable from prior.  No tenderness.  We discussed the risks and benefits of hydrocelectomy at length including bleeding, infection, recurrence, bruising/swelling.  We will schedule right hydrocelectomy   Angeliyah Kirkey C Eural Holzschuh, MD  [facility] [address], [address] [place], [place] [address] [phone]    

## 2020-07-15 NOTE — H&P (View-Only) (Signed)
   07/15/2020 3:56 PM   Neil Bailey February 18, 1951 [id number]  Reason for visit: Follow up scrotal swelling, renal cyst  HPI: I saw Neil Bailey back in urology clinic for scrotal swelling.  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  This was ultimately repaired robotically by Dr. Everlene Farrier.  He had a prolonged recovery where it took at least 3 months for the left sided swelling to improve after surgery, but it has almost completely resolved and he is doing well.  His primary complaint today is bothersome right-sided scrotal swelling from a large hydrocele which has been confirmed on scrotal ultrasound.  He is interested in hydrocelectomy.  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  I personally viewed and interpreted the MRI images and agree with these findings.  On exam, the left scrotal swelling has decreased from our last visit, he has persistent large right hydrocele stable from prior.  No tenderness.  We discussed the risks and benefits of hydrocelectomy at length including bleeding, infection, recurrence, bruising/swelling.  We will schedule right hydrocelectomy   Sondra Come, MD  [facility] [address], [address] [place], [place] [address] [phone]

## 2020-07-15 NOTE — Patient Instructions (Signed)
Hydrocelectomy, Adult  A hydrocelectomy is a surgical procedure to remove a collection of fluid (hydrocele) from the scrotum, which is the pouch that holds the testicles. You may need to have this procedure if a hydrocele is causing painful swelling in your scrotum. Tell a health care provider about:  Any allergies you have.  All medicines you are taking, including vitamins, herbs, eye drops, creams, and over-the-counter medicines.  Any problems you or family members have had with anesthetic medicines.  Any blood disorders you have.  Any surgeries you have had.  Any medical conditions you have. What are the risks? Generally, this is a safe procedure. However, problems may occur, including:  Bleeding into the scrotum (scrotal hematoma).  Damage to nearby structures or organs, including to the testicle or the tube that carries sperm out of the testicle (vas deferens).  Infection.  Allergic reactions to medicines. What happens before the procedure? Staying hydrated Follow instructions from your health care provider about hydration, which may include:  Up to 2 hours before the procedure - you may continue to drink clear liquids, such as water, clear fruit juice, black coffee, and plain tea. Eating and drinking restrictions Follow instructions from your health care provider about eating and drinking, which may include:  8 hours before the procedure - stop eating heavy meals or foods, such as meat, fried foods, or fatty foods.  6 hours before the procedure - stop eating light meals or foods, such as toast or cereal.  6 hours before the procedure - stop drinking milk or drinks that contain milk.  2 hours before the procedure - stop drinking clear liquids. Medicines Ask your health care provider about:  Changing or stopping your regular medicines. This is especially important if you are taking diabetes medicines or blood thinners.  Taking medicines such as aspirin and ibuprofen.  These medicines can thin your blood. Do not take these medicines unless your health care provider tells you to take them.  Taking over-the-counter medicines, vitamins, herbs, and supplements. General instructions  Do not use any products that contain nicotine or tobacco for at least 4 weeks before the procedure. These products include cigarettes, e-cigarettes, and chewing tobacco. If you need help quitting, ask your health care provider.  Plan to have someone take you home from the hospital or clinic.  Plan to have a responsible adult care for you for at least 24 hours after you leave the hospital or clinic. This is important.  Ask your health care provider: ? How your surgery site will be marked. ? What steps will be taken to help prevent infection. These may include:  Removing hair at the surgery site.  Washing skin with a germ-killing soap.  Taking antibiotic medicine. What happens during the procedure?  An IV will be inserted into one of your veins.  You will be given one or more of the following: ? A medicine to make you relax (sedative). ? A medicine to make you fall asleep (general anesthetic).  A small incision will be made through the skin of your scrotum.  Your testicle and the hydrocele will be located, and the hydrocele sac will be opened with an incision.  The fluid will be drained from the hydrocele. Part of the hydrocele sac may be removed.  The hydrocele will be closed with stitches that dissolve (absorbable sutures). This prevents fluid from building up again.  If your hydrocele is large, you may have a thin, rubber drain placed to allow fluid to drain  after the procedure.  The incision in your scrotum will be closed with absorbable sutures, skin glue, or adhesives.  A bandage (dressing) will be placed over the incision. The dressing may be held in place with an athletic support strap (scrotal support). The procedure may vary among health care providers and  hospitals. What happens after the procedure?   Your blood pressure, heart rate, breathing rate, and blood oxygen level will be monitored until you leave the hospital or clinic.  You will be given pain medicine as needed.  Your IV will be removed, and your insertion site will be checked for bleeding.  Do not drive for 24 hours if you were given a sedative during your procedure.  You may need to wear a scrotal support. This holds the dressing in place and supports your scrotum. Summary  A hydrocelectomy is a surgical procedure to remove a collection of fluid (hydrocele) from the scrotum, which is the pouch that holds the testicles. You may need to have this procedure if a hydrocele is causing painful swelling in your scrotum.  During the procedure, the hydrocele will be drained and then closed with stitches that dissolve (absorbable sutures). This prevents fluid from building up again.  If your hydrocele is large, you may have a thin, rubber drain placed to allow fluid to drain after the procedure.  You may need to wear a scrotal support after your procedure. This holds the dressing in place and supports your scrotum. This information is not intended to replace advice given to you by your health care provider. Make sure you discuss any questions you have with your health care provider. Document Revised: 11/20/2018 Document Reviewed: 11/20/2018 Elsevier Patient Education  2020 [employer]   Hydrocele, Adult A hydrocele is a collection of fluid in the loose pouch of skin that holds the testicles (scrotum). This may happen because:  The amount of fluid produced in the scrotum is not absorbed by the rest of the body.  Fluid from the abdomen fills the scrotum. Normally, the testicles develop in the abdomen then move (drop) into to the scrotum before birth. The tube that the testicles travel through usually closes after the testicles drop. If the tube does not close, fluid from the abdomen  can fill the scrotum. This is less common in adults. What are the causes? The cause of a hydrocele in adults is usually not known. However, it may be caused by:  An injury to the scrotum.  An infection (epididymitis).  Decreased blood flow to the scrotum.  Twisting of a testicle (testicular torsion).  A birth defect.  A tumor or cancer of the testicle. What are the signs or symptoms? A hydrocele feels like a water-filled balloon. It may also feel heavy. Other symptoms include:  Swelling of the scrotum. The swelling may decrease when you lie down. You may also notice more swelling at night than in the morning.  Swelling of the groin.  Mild discomfort in the scrotum.  Pain. This can develop if the hydrocele was caused by infection or twisting. The larger the hydrocele, the more likely you are to have pain. How is this diagnosed? This condition may be diagnosed based on:  Physical exam.  Medical history. You may also have other tests, including:  Imaging tests, such as ultrasound.  Blood or urine tests. How is this treated? Most hydroceles go away on their own. If you have no discomfort or pain, your health care provider may suggest close monitoring of your   condition (called watch and wait or watchful waiting) until the condition goes away or symptoms develop. If treatment is needed, it may include:  Treating an underlying condition. This may include using an antibiotic medicine to treat an infection.  Surgery to stop fluid from collecting in the scrotum.  Surgery to drain the fluid. Options include: ? Needle aspiration. A needle is used to drain fluid. However, the fluid buildup will come back quickly. ? Hydrocelectomy. For this procedure, an incision is made in the scrotum to remove the fluid sac. Follow these instructions at home:  Watch the hydrocele for any changes.  Take over-the-counter and prescription medicines only as told by your health care provider.  If  you were prescribed an antibiotic medicine, use it as told by your health care provider. Do not stop taking the antibiotic even if you start to feel better.  Keep all follow-up visits as told by your health care provider. This is important. Contact a health care provider if:  You notice any changes in the hydrocele.  The swelling in your scrotum or groin gets worse.  The hydrocele becomes red, firm, painful, or tender to the touch.  You have a fever. Get help right away if you:  Develop a lot of pain, or your pain becomes worse. Summary  A hydrocele is a collection of fluid in the loose pouch of skin that holds the testicles (scrotum).  Hydroceles can cause swelling, discomfort, and sometimes pain.  In adults, the cause of a hydrocele usually is not known. However, it is sometimes caused by an infection or a rotation and twisting of the scrotum.  Treatment is usually not needed. Hydroceles often go away on their own. If a hydrocele causes pain, treatment may be given to ease the pain. This information is not intended to replace advice given to you by your health care provider. Make sure you discuss any questions you have with your health care provider. Document Revised: 07/08/2017 Document Reviewed: 07/08/2017 Elsevier Patient Education  2020 [employer] Inc.   

## 2020-07-17 ENCOUNTER — Telehealth: Payer: Self-pay | Admitting: Urology

## 2020-07-17 NOTE — Telephone Encounter (Signed)
Pt called asking about any information on upcoming surgery date.

## 2020-07-20 ENCOUNTER — Other Ambulatory Visit: Payer: Self-pay | Admitting: Urology

## 2020-07-21 ENCOUNTER — Other Ambulatory Visit: Payer: Self-pay | Admitting: Urology

## 2020-07-21 DIAGNOSIS — N432 Other hydrocele: Secondary | ICD-10-CM

## 2020-07-24 ENCOUNTER — Other Ambulatory Visit
Admission: RE | Admit: 2020-07-24 | Discharge: 2020-07-24 | Disposition: A | Discharge status: Home / Self Care | Payer: Medicare HMO | Source: Ambulatory Visit | Attending: Internal Medicine | Admitting: Internal Medicine

## 2020-07-24 ENCOUNTER — Other Ambulatory Visit: Payer: Self-pay

## 2020-07-24 DIAGNOSIS — Z01812 Encounter for preprocedural laboratory examination: Secondary | ICD-10-CM | POA: Diagnosis present

## 2020-07-24 DIAGNOSIS — Z20822 Contact with and (suspected) exposure to covid-19: Secondary | ICD-10-CM | POA: Diagnosis not present

## 2020-07-24 LAB — SARS CORONAVIRUS 2 (TAT 6-24 HRS): SARS Coronavirus 2: NEGATIVE

## 2020-07-27 ENCOUNTER — Other Ambulatory Visit: Admission: RE | Admit: 2020-07-27 | Payer: Medicare HMO | Source: Ambulatory Visit

## 2020-07-28 ENCOUNTER — Other Ambulatory Visit: Payer: Self-pay | Admitting: Urology

## 2020-07-31 ENCOUNTER — Other Ambulatory Visit: Payer: Self-pay

## 2020-07-31 ENCOUNTER — Encounter
Admission: RE | Admit: 2020-07-31 | Discharge: 2020-07-31 | Disposition: A | Discharge status: Home / Self Care | Payer: Medicare HMO | Source: Ambulatory Visit | Attending: Urology | Admitting: Urology

## 2020-07-31 NOTE — Patient Instructions (Addendum)
Your procedure is scheduled on:08-07-20 FRIDAY Report to the Registration Desk on the 1st floor of the [facility]-Then proceed to the 2nd floor Surgery Desk in the [facility] To find out your arrival time, please call [phone] between 1PM - 3PM on:08-06-20 THURSDAY  REMEMBER: Instructions that are not followed completely may result in serious medical risk, up to and including death; or upon the discretion of your surgeon and anesthesiologist your surgery may need to be rescheduled.  Do not eat food after midnight the night before surgery.  No gum chewing, lozengers or hard candies.  You may however, drink CLEAR liquids up to 2 hours before you are scheduled to arrive for your surgery. Do not drink anything within 2 hours of your scheduled arrival time.  Clear liquids include: - water  - apple juice without pulp - gatorade - black coffee or tea (Do NOT add milk or creamers to the coffee or tea) Do NOT drink anything that is not on this list.  TAKE THESE MEDICATIONS THE MORNING OF SURGERY WITH A SIP OF WATER: -METOPROLOL (TOPROL)  Follow recommendations from Cardiologist, Pulmonologist or PCP regarding stopping Aspirin, Coumadin, Plavix, Eliquis, Pradaxa, or Pletal-COUMADIN (WARFARIN) TO BE STOPPED 5 DAYS PRIOR TO SURGERY -LAST DOSE WILL BE ON 08-01-20 SATURDAY  One week prior to surgery: Stop Anti-inflammatories (NSAIDS) such as Advil, Aleve, Ibuprofen, Motrin, Naproxen, Naprosyn and Aspirin based products such as Excedrin, Goodys Powder, BC Powder-OK TO TAKE TYLENOL IF NEEDED  Stop ANY OVER THE COUNTER supplements until after surgery. (however still continue your Potassium up until the day prior to your surgery)  No Alcohol for 24 hours before or after surgery.  No Smoking including e-cigarettes for 24 hours prior to surgery.  No chewable tobacco products for at least 6 hours prior to surgery.  No nicotine patches on the day of surgery.  Do not use any "recreational" drugs  for at least a week prior to your surgery.  Please be advised that the combination of cocaine and anesthesia may have negative outcomes, up to and including death. If you test positive for cocaine, your surgery will be cancelled.  On the morning of surgery brush your teeth with toothpaste and water, you may rinse your mouth with mouthwash if you wish. Do not swallow any toothpaste or mouthwash.  Do not wear jewelry, make-up, hairpins, clips or nail polish.  Do not wear lotions, powders, or perfumes.   Do not shave body from the neck down 48 hours prior to surgery just in case you cut yourself which could leave a site for infection.  Also, freshly shaved skin may become irritated if using the CHG soap.  Contact lenses, hearing aids and dentures may not be worn into surgery.  Do not bring valuables to the hospital. [facility] is not responsible for any missing/lost belongings or valuables.   Notify your doctor if there is any change in your medical condition (cold, fever, infection).  Wear comfortable clothing (specific to your surgery type) to the hospital.  Plan for stool softeners for home use; pain medications have a tendency to cause constipation. You can also help prevent constipation by eating foods high in fiber such as fruits and vegetables and drinking plenty of fluids as your diet allows.  After surgery, you can help prevent lung complications by doing breathing exercises.  Take deep breaths and cough every 1-2 hours. Your doctor may order a device called an Incentive Spirometer to help you take deep breaths. When coughing  or sneezing, hold a pillow firmly against your incision with both hands. This is called "splinting." Doing this helps protect your incision. It also decreases belly discomfort.  If you are being admitted to the hospital overnight, leave your suitcase in the car. After surgery it may be brought to your room.  If you are being discharged the day of surgery,  you will not be allowed to drive home. You will need a responsible adult (18 years or older) to drive you home and stay with you that night.   If you are taking public transportation, you will need to have a responsible adult (18 years or older) with you. Please confirm with your physician that it is acceptable to use public transportation.   Please call the [place] [place] [facility] at [phone] if you have any questions about these instructions.  Visitation Policy:  Patients undergoing a surgery or procedure may have one family member or support person with them as long as that person is not COVID-19 positive or experiencing its symptoms.  That person may remain in the waiting area during the procedure.  Inpatient Visitation:    Visiting hours are 7 a.m. to 8 p.m. Patients will be allowed one visitor. The visitor may change daily. The visitor must pass COVID-19 screenings, use hand sanitizer when entering and exiting the patient's room and wear a mask at all times, including in the patient's room. Patients must also wear a mask when staff or their visitor are in the room. Masking is required regardless of vaccination status. Systemwide, no visitors 17 or younger.

## 2020-07-31 NOTE — Pre-Procedure Instructions (Signed)
ECG 12-lead  Component 1 mo ago  Vent Rate (bpm)  88   QRS Interval (msec)  90   QT Interval (msec)  372   QTc (msec)  450   Resulting Agency [facility] RESULTS   Narrative Performed by Barnes This result has an attachment that is not available.  Atrial fibrillation with premature ventricular or aberrantly conducted complexes  Low voltage QRS  Cannot rule out Anterior infarct , age undetermined  Abnormal ECG  When compared with ECG of 15-Dec-2014 15:31,  No significant change was found  I reviewed and concur with this report. Electronically signed BJ:[email or web address], MD, ALEX [phone]) on 06/30/2020 5:08:53 PM Specimen Collected: 06/23/20 8:55 AM Last Resulted: 06/23/20 8:55 AM  Received From: [place]  Result Received: 07/01/20 11:12 AM  View Encounter

## 2020-08-03 ENCOUNTER — Encounter: Payer: Self-pay | Admitting: Urology

## 2020-08-05 ENCOUNTER — Encounter
Admission: RE | Admit: 2020-08-05 | Discharge: 2020-08-05 | Disposition: A | Discharge status: Home / Self Care | Payer: Medicare HMO | Source: Ambulatory Visit | Attending: Urology | Admitting: Urology

## 2020-08-05 ENCOUNTER — Other Ambulatory Visit: Payer: Self-pay

## 2020-08-05 DIAGNOSIS — Z01812 Encounter for preprocedural laboratory examination: Secondary | ICD-10-CM | POA: Insufficient documentation

## 2020-08-05 DIAGNOSIS — Z20822 Contact with and (suspected) exposure to covid-19: Secondary | ICD-10-CM | POA: Diagnosis not present

## 2020-08-05 LAB — POTASSIUM: Potassium: 4 mmol/L (ref 3.5–5.1)

## 2020-08-06 LAB — SARS CORONAVIRUS 2 (TAT 6-24 HRS): SARS Coronavirus 2: NEGATIVE

## 2020-08-07 ENCOUNTER — Ambulatory Visit: Payer: Medicare HMO | Admitting: Urgent Care

## 2020-08-07 ENCOUNTER — Ambulatory Visit
Admission: RE | Admit: 2020-08-07 | Discharge: 2020-08-07 | Disposition: A | Discharge status: Home / Self Care | Payer: Medicare HMO | Attending: Urology | Admitting: Urology

## 2020-08-07 ENCOUNTER — Encounter
Admission: RE | Disposition: A | Discharge status: Home / Self Care | Payer: Self-pay | Source: Home / Self Care | Attending: Urology

## 2020-08-07 ENCOUNTER — Other Ambulatory Visit: Payer: Self-pay

## 2020-08-07 DIAGNOSIS — N433 Hydrocele, unspecified: Secondary | ICD-10-CM | POA: Insufficient documentation

## 2020-08-07 DIAGNOSIS — N430 Encysted hydrocele: Secondary | ICD-10-CM | POA: Diagnosis not present

## 2020-08-07 DIAGNOSIS — N432 Other hydrocele: Secondary | ICD-10-CM

## 2020-08-07 HISTORY — PX: HYDROCELE EXCISION: SHX482

## 2020-08-07 LAB — PROTIME-INR
INR: 1.1 (ref 0.8–1.2)
Prothrombin Time: 13.6 seconds (ref 11.4–15.2)

## 2020-08-07 SURGERY — HYDROCELECTOMY
Anesthesia: General | Site: Scrotum | Laterality: Right

## 2020-08-07 MED ORDER — FENTANYL CITRATE (PF) 100 MCG/2ML IJ SOLN
INTRAMUSCULAR | Status: AC
  Filled 2020-08-07: qty 2

## 2020-08-07 MED ORDER — BACITRACIN 500 UNIT/GM EX OINT
TOPICAL_OINTMENT | CUTANEOUS | Status: DC | PRN
  Administered 2020-08-07: 1 via TOPICAL

## 2020-08-07 MED ORDER — ACETAMINOPHEN 10 MG/ML IV SOLN
INTRAVENOUS | Status: DC | PRN
  Administered 2020-08-07: 1000 mg via INTRAVENOUS

## 2020-08-07 MED ORDER — CHLORHEXIDINE GLUCONATE 0.12 % MT SOLN
OROMUCOSAL | Status: AC
  Administered 2020-08-07: 15 mL via OROMUCOSAL
  Filled 2020-08-07: qty 15

## 2020-08-07 MED ORDER — LACTATED RINGERS IV SOLN: INTRAVENOUS | Status: DC

## 2020-08-07 MED ORDER — CEFAZOLIN SODIUM-DEXTROSE 2-4 GM/100ML-% IV SOLN
INTRAVENOUS | Status: AC
  Filled 2020-08-07: qty 100

## 2020-08-07 MED ORDER — PROPOFOL 10 MG/ML IV BOLUS
INTRAVENOUS | Status: DC | PRN
  Administered 2020-08-07: 160 mg via INTRAVENOUS

## 2020-08-07 MED ORDER — FENTANYL CITRATE (PF) 100 MCG/2ML IJ SOLN
INTRAMUSCULAR | Status: DC | PRN
  Administered 2020-08-07 (×2): 25 ug via INTRAVENOUS
  Administered 2020-08-07: 50 ug via INTRAVENOUS

## 2020-08-07 MED ORDER — LIDOCAINE HCL (PF) 2 % IJ SOLN
INTRAMUSCULAR | Status: AC
  Filled 2020-08-07: qty 5

## 2020-08-07 MED ORDER — FAMOTIDINE 20 MG PO TABS
ORAL_TABLET | ORAL | Status: AC
  Administered 2020-08-07: 20 mg via ORAL
  Filled 2020-08-07: qty 1

## 2020-08-07 MED ORDER — OXYCODONE HCL 5 MG PO TABS
5.0000 mg | ORAL_TABLET | Freq: Once | ORAL | Status: DC | PRN
Start: 2020-08-07 — End: 2020-08-07

## 2020-08-07 MED ORDER — CEFAZOLIN SODIUM-DEXTROSE 2-4 GM/100ML-% IV SOLN
2.0000 g | INTRAVENOUS | Status: AC
  Administered 2020-08-07: 2 g via INTRAVENOUS

## 2020-08-07 MED ORDER — DEXAMETHASONE SODIUM PHOSPHATE 10 MG/ML IJ SOLN
INTRAMUSCULAR | Status: DC | PRN
  Administered 2020-08-07: 6 mg via INTRAVENOUS

## 2020-08-07 MED ORDER — CEPHALEXIN 500 MG PO CAPS: 500.0000 mg | ORAL_CAPSULE | Freq: Every day | ORAL | 0 refills | Status: AC

## 2020-08-07 MED ORDER — LIDOCAINE HCL 1 % IJ SOLN
INTRAMUSCULAR | Status: DC | PRN
  Administered 2020-08-07: 12 mL

## 2020-08-07 MED ORDER — FENTANYL CITRATE (PF) 100 MCG/2ML IJ SOLN: 25.0000 ug | INTRAMUSCULAR | Status: DC | PRN

## 2020-08-07 MED ORDER — BACITRACIN ZINC 500 UNIT/GM EX OINT
TOPICAL_OINTMENT | CUTANEOUS | Status: AC
  Filled 2020-08-07: qty 28.35

## 2020-08-07 MED ORDER — ORAL CARE MOUTH RINSE: 15.0000 mL | Freq: Once | OROMUCOSAL | Status: AC

## 2020-08-07 MED ORDER — ACETAMINOPHEN 10 MG/ML IV SOLN
INTRAVENOUS | Status: AC
  Filled 2020-08-07: qty 100

## 2020-08-07 MED ORDER — HYDROCODONE-ACETAMINOPHEN 5-325 MG PO TABS: 1.0000 | ORAL_TABLET | Freq: Four times a day (QID) | ORAL | 0 refills | Status: AC | PRN

## 2020-08-07 MED ORDER — CHLORHEXIDINE GLUCONATE 0.12 % MT SOLN: 15.0000 mL | Freq: Once | OROMUCOSAL | Status: AC

## 2020-08-07 MED ORDER — KETAMINE HCL 50 MG/5ML IJ SOSY
PREFILLED_SYRINGE | INTRAMUSCULAR | Status: AC
  Filled 2020-08-07: qty 5

## 2020-08-07 MED ORDER — FAMOTIDINE 20 MG PO TABS: 20.0000 mg | ORAL_TABLET | Freq: Once | ORAL | Status: AC

## 2020-08-07 MED ORDER — LIDOCAINE HCL (CARDIAC) PF 100 MG/5ML IV SOSY
PREFILLED_SYRINGE | INTRAVENOUS | Status: DC | PRN
  Administered 2020-08-07: 100 mg via INTRAVENOUS

## 2020-08-07 MED ORDER — KETAMINE HCL 10 MG/ML IJ SOLN
INTRAMUSCULAR | Status: DC | PRN
  Administered 2020-08-07: 30 mg via INTRAVENOUS

## 2020-08-07 MED ORDER — ONDANSETRON HCL 4 MG/2ML IJ SOLN
INTRAMUSCULAR | Status: DC | PRN
  Administered 2020-08-07: 4 mg via INTRAVENOUS

## 2020-08-07 MED ORDER — PROPOFOL 10 MG/ML IV BOLUS
INTRAVENOUS | Status: AC
  Filled 2020-08-07: qty 20

## 2020-08-07 MED ORDER — OXYCODONE HCL 5 MG/5ML PO SOLN
5.0000 mg | Freq: Once | ORAL | Status: DC | PRN
Start: 2020-08-07 — End: 2020-08-07

## 2020-08-07 SURGICAL SUPPLY — 33 items
APL PRP STRL LF DISP 70% ISPRP (MISCELLANEOUS) ×1
BLADE CLIPPER SURG (BLADE) ×2 IMPLANT
BLADE SURG 15 STRL LF DISP TIS (BLADE) ×1 IMPLANT
BLADE SURG 15 STRL SS (BLADE) ×2
BRIEF STRETCH MATERNITY 2XLG (MISCELLANEOUS) ×2 IMPLANT
CANISTER SUCT 1200ML W/VALVE (MISCELLANEOUS) ×2 IMPLANT
CHLORAPREP W/TINT 26 (MISCELLANEOUS) ×2 IMPLANT
COVER WAND RF STERILE (DRAPES) ×2 IMPLANT
DRAIN PENROSE 1/4X12 LTX STRL (WOUND CARE) ×2 IMPLANT
DRAPE LAPAROTOMY 77X122 PED (DRAPES) ×2 IMPLANT
DRSG GAUZE FLUFF 36X18 (GAUZE/BANDAGES/DRESSINGS) ×2 IMPLANT
DRSG TELFA 4X3 1S NADH ST (GAUZE/BANDAGES/DRESSINGS) ×2 IMPLANT
ELECT REM PT RETURN 9FT ADLT (ELECTROSURGICAL) ×2
ELECTRODE REM PT RTRN 9FT ADLT (ELECTROSURGICAL) ×1 IMPLANT
GAUZE SPONGE 4X4 12PLY STRL (GAUZE/BANDAGES/DRESSINGS) IMPLANT
GLOVE BIOGEL PI IND STRL 7.5 (GLOVE) ×1 IMPLANT
GLOVE BIOGEL PI INDICATOR 7.5 (GLOVE) ×1
GOWN STRL REUS W/ TWL LRG LVL3 (GOWN DISPOSABLE) ×1 IMPLANT
GOWN STRL REUS W/ TWL XL LVL3 (GOWN DISPOSABLE) ×1 IMPLANT
GOWN STRL REUS W/TWL LRG LVL3 (GOWN DISPOSABLE) ×2
GOWN STRL REUS W/TWL XL LVL3 (GOWN DISPOSABLE) ×2
KIT TURNOVER KIT A (KITS) ×2 IMPLANT
LABEL OR SOLS (LABEL) ×2 IMPLANT
MANIFOLD NEPTUNE II (INSTRUMENTS) ×2 IMPLANT
NEEDLE HYPO 25X1 1.5 SAFETY (NEEDLE) ×2 IMPLANT
NS IRRIG 500ML POUR BTL (IV SOLUTION) ×2 IMPLANT
PACK BASIN MINOR ARMC (MISCELLANEOUS) ×2 IMPLANT
SUT CHROMIC 3 0 PS 2 (SUTURE) ×2 IMPLANT
SUT ETHILON 3-0 FS-10 30 BLK (SUTURE) ×2
SUT VIC AB 2-0 SH 27 (SUTURE) ×4
SUT VIC AB 2-0 SH 27XBRD (SUTURE) ×2 IMPLANT
SUTURE EHLN 3-0 FS-10 30 BLK (SUTURE) ×1 IMPLANT
SYR 10ML LL (SYRINGE) ×2 IMPLANT

## 2020-08-07 NOTE — Anesthesia Procedure Notes (Signed)
Procedure Name: LMA Insertion Date/Time: 08/07/2020 12:03 PM Performed by: Lia Foyer, CRNA Pre-anesthesia Checklist: Patient identified, Emergency Drugs available, Suction available and Patient being monitored Patient Re-evaluated:Patient Re-evaluated prior to induction Oxygen Delivery Method: Circle system utilized Preoxygenation: Pre-oxygenation with 100% oxygen Induction Type: IV induction Ventilation: Mask ventilation without difficulty LMA: LMA flexible inserted LMA Size: 4.5 Tube type: Oral Number of attempts: 1 Placement Confirmation: positive ETCO2 and breath sounds checked- equal and bilateral Tube secured with: Tape Dental Injury: Teeth and Oropharynx as per pre-operative assessment

## 2020-08-07 NOTE — Op Note (Signed)
Date of procedure: 08/07/20  Preoperative diagnosis:  1. Right hydrocele  Postoperative diagnosis:  1. Same  Procedure: 1. Right hydrocelectomy  Surgeon: [name], MD  Anesthesia: General  Complications: None  Intraoperative findings:  1. Thick and fibrotic rind and moderate size right hydrocele, unable to perform Jaboulee repair secondary to thickened non-mobile rind 2. Penrose drain placed in right scrotum  EBL: 10 mL  Specimens: None  Drains: Quarter-inch Penrose right scrotum  Indication: Neil Bailey is a 71 y.o. patient with large right hydrocele.  After reviewing the management options for treatment, they elected to proceed with the above surgical procedure(s). We have discussed the potential benefits and risks of the procedure, side effects of the proposed treatment, the likelihood of the patient achieving the goals of the procedure, and any potential problems that might occur during the procedure or recuperation. Informed consent has been obtained.  Description of procedure:  The patient was taken to the operating room and general anesthesia was induced. SCDs were placed for DVT prophylaxis. The patient was placed in the supine position, prepped and draped in the usual sterile fashion, and preoperative antibiotics(Ancef) were administered. A preoperative time-out was performed.   A transverse 5 cm incision was made in the right hemiscrotum and a skin incision was made. I dissected down through the dartos which was very thick and fibrotic. I was able to free up the dartos from the hydrocele sac, but there was significant scarring throughout the scrotum. The hydrocele sac was opened and clear yellow fluid was drained. The tunica vaginalis was thick and fibrotic like a rind. I was unable to perform a Jaboulee repair as the tissue was thick and non-pliable. I excised the majority of the hydrocele sac around the testicle. Meticulous hemostasis was achieved in the  scrotum was copiously irrigated.  1/4 inch Penrose was placed in the right hemiscrotum dependently and secured with a drain stitch.  A running 2-0 Vicryl was used to close the dartos, and a running 3-0 chromic for the skin. Local anesthetic was injected  Disposition: Stable to PACU  Plan: Plan for drain removal next week with PA okay to resume Coumadin in 5 days  [name], MD

## 2020-08-07 NOTE — Discharge Instructions (Signed)
Current Urology, 10(1), 1-14. https://[email or web address]">  Hydrocelectomy, Adult, Care After This sheet gives you information about how to care for yourself after your procedure. Your health care provider may also give you more specific instructions. If you have problems or questions, contact your health care provider. What can I expect after the procedure? After your procedure, it is common to have:  Mild discomfort and swelling in the pouch that holds your testicles (scrotum).  Bruising of the scrotum. Follow these instructions at home: Medicines  Take over-the-counter and prescription medicines only as told by your health care provider.  Ask your health care provider if the medicine prescribed to you: ? Requires you to avoid driving or using heavy machinery. ? Can cause constipation. You may need to take these actions to prevent or treat constipation:  Drink enough fluid to keep your urine pale yellow.  Take over-the-counter or prescription medicines.  Eat foods that are high in fiber, such as beans, whole grains, and fresh fruits and vegetables.  Limit foods that are high in fat and processed sugars, such as fried or sweet foods. Bathing  Do not take baths, swim, or use a hot tub until your health care provider approves. Ask your health care provider if you may take showers. You may only be allowed to take sponge baths.  If you were told to wear an athletic support strap (scrotal support), keep it dry. Take it off when you shower or bathe. Incision care  Follow instructions from your health care provider about how to take care of your incision. Make sure you: ? Wash your hands with soap and water before and after you change your bandage (dressing). If soap and water are not available, use hand sanitizer. ? Change your dressing as told by your health care provider. ? Leave stitches (sutures), skin glue, or adhesive strips in place. These skin closures may need to  stay in place for 2 weeks or longer. If adhesive strip edges start to loosen and curl up, you may trim the loose edges. Do not remove adhesive strips completely unless your health care provider tells you to do that.  Check your incision and scrotum every day for signs of infection. Check for: ? More redness, swelling, or pain. ? Fluid or blood. ? Warmth. ? Pus or a bad smell.   Managing pain and swelling If directed, put ice on the affected area. To do this:  Put ice in a plastic bag.  Place a towel between your skin and the bag.  Leave the ice on for 20 minutes, 2-3 times per day.   Activity  Do not do any high-energy activities for as long as told by your health care provider.  Do not lift anything that is heavier than 10 lb (4.5 kg), or the limit that you are told, until your health care provider says that it is safe.  Return to your normal activities as told by your health care provider. Ask your health care provider what activities are safe for you.  Do not drive for 24 hours if you were given a sedative during your procedure.  Ask your health care provider when it is safe to drive. General instructions  Do not use any products that contain nicotine or tobacco, such as cigarettes, e-cigarettes, and chewing tobacco. These can delay incision healing after surgery. If you need help quitting, ask your health care provider.  If you were given a scrotal support, wear it as told by your health care provider.  If you had a drain put in during the procedure, you will need to have it removed at a follow-up visit.  Keep all follow-up visits as told by your health care provider. This is important. Contact a health care provider if:  Your pain is not controlled with medicine.  You have more redness, swelling, or pain around your scrotum.  You have fluid or blood coming from your incision.  Your incision feels warm to the touch.  You have pus or a bad smell coming from your  scrotum.  You have a fever. Get help right away if:  You develop shaking, chills, and a fever that is higher than 101.46F (38.8C).  You have redness or swelling that starts at your scrotum and spreads outward to cover your whole groin.  You develop swelling of the legs or difficulty breathing. Summary  After a hydrocelectomy, it is common to have mild discomfort, swelling, and bruising.  Do not take baths, swim, or use a hot tub until your health care provider approves. Ask your health care provider if you may take showers.  If directed, put ice on the affected area to help with pain and swelling.  Do not do any high-energy activities or lift anything heavier than 10 lb (4.5 kg) for as long as told by your health care provider.  If you were given a scrotal support, keep it dry. Wear the scrotal support as told by your health care provider. This information is not intended to replace advice given to you by your health care provider. Make sure you discuss any questions you have with your health care provider. Document Revised: 11/20/2018 Document Reviewed: 11/20/2018 Elsevier Patient Education  2021 [place]  AMBULATORY SURGERY  DISCHARGE INSTRUCTIONS   1) The drugs that you were given will stay in your system until tomorrow so for the next 24 hours you should not:  A) Drive an automobile B) Make any legal decisions C) Drink any alcoholic beverage   2) You may resume regular meals tomorrow.  Today it is better to start with liquids and gradually work up to solid foods.  You may eat anything you prefer, but it is better to start with liquids, then soup and crackers, and gradually work up to solid foods.   3) Please notify your doctor immediately if you have any unusual bleeding, trouble breathing, redness and pain at the surgery site, drainage, fever, or pain not relieved by medication.    4) Additional Instructions:        Please contact  your physician with any problems or Same Day Surgery at [phone], Monday through Friday 6 am to 4 pm, or Bath at [facility] number at [phone].DISCHARGE INSTRUCTIONS   5) The drugs that you were given will stay in your system until tomorrow so for the next 24 hours you should not:  D) Drive an automobile E) Make any legal decisions F) Drink any alcoholic beverage   6) You may resume regular meals tomorrow.  Today it is better to start with liquids and gradually work up to solid foods.  You may eat anything you prefer, but it is better to start with liquids, then soup and crackers, and gradually work up to solid foods.   7) Please notify your doctor immediately if you have any unusual bleeding, trouble breathing, redness and pain at the surgery site, drainage, fever, or pain not relieved by medication.  8) Your post-operative visit with Dr.  is: Date:                        Time:    Please call to schedule your post-operative visit.  9) Additional Instructions:

## 2020-08-07 NOTE — Anesthesia Postprocedure Evaluation (Signed)
Anesthesia Post Note  Patient: Neil Bailey  Procedure(s) Performed: HYDROCELECTOMY ADULT (Right Scrotum)  Patient location during evaluation: PACU Anesthesia Type: General Level of consciousness: awake and alert Pain management: pain level controlled Vital Signs Assessment: post-procedure vital signs reviewed and stable Respiratory status: spontaneous breathing, nonlabored ventilation, respiratory function stable and patient connected to nasal cannula oxygen Cardiovascular status: blood pressure returned to baseline and stable Postop Assessment: no apparent nausea or vomiting Anesthetic complications: no   No complications documented.   Last Vitals:  Vitals:   08/07/20 1330 08/07/20 1348  BP: (!) 105/57 130/87  Pulse: 92 93  Resp: 16 18  Temp:  (!) 36.2 C  SpO2: 92% 97%    Last Pain:  Vitals:   08/07/20 1348  TempSrc: Temporal  PainSc: 4                  Neil Bailey

## 2020-08-07 NOTE — Transfer of Care (Signed)
Immediate Anesthesia Transfer of Care Note  Patient: Dwana Curd  Procedure(s) Performed: HYDROCELECTOMY ADULT (Right Scrotum)  Patient Location: PACU  Anesthesia Type:General  Level of Consciousness: drowsy  Airway & Oxygen Therapy: Patient Spontanous Breathing and Patient connected to face mask oxygen  Post-op Assessment: Report given to RN and Post -op Vital signs reviewed and stable  Post vital signs: Reviewed and stable  Last Vitals:  Vitals Value Taken Time  BP 103/71 08/07/20 1300  Temp    Pulse 92 08/07/20 1302  Resp 19 08/07/20 1302  SpO2 98 % 08/07/20 1302  Vitals shown include unvalidated device data.  Last Pain:  Vitals:   08/07/20 1011  TempSrc: Temporal         Complications: No complications documented.

## 2020-08-07 NOTE — Anesthesia Preprocedure Evaluation (Addendum)
Anesthesia Evaluation  Patient identified by MRN, date of birth, ID band Patient awake    Reviewed: Allergy & Precautions, H&P , NPO status , Patient's Chart, lab work & pertinent test results  History of Anesthesia Complications Negative for: history of anesthetic complications  Airway Mallampati: III  TM Distance: <3 FB Neck ROM: full    Dental  (+) Chipped, Poor Dentition   Pulmonary shortness of breath and with exertion, pneumonia, former smoker,    Pulmonary exam normal        Cardiovascular Exercise Tolerance: Good hypertension, (-) Past MI + dysrhythmias Atrial Fibrillation      Neuro/Psych negative neurological ROS  negative psych ROS   GI/Hepatic negative GI ROS, Neg liver ROS,   Endo/Other  negative endocrine ROS  Renal/GU Renal disease     Musculoskeletal   Abdominal   Peds  Hematology negative hematology ROS (+)   Anesthesia Other Findings Patient has cardiac clearance for this procedure.   Past Medical History: 03/09/2020: Aortic atherosclerosis (HCC)     Comment:  Noted on abdominal CT No date: Atrial fibrillation ([place]) 06/2019: COVID-19 No date: DVT (deep venous thrombosis) ([place]) No date: Dyspnea     Comment:  since covid dec 2020 No date: HTN (hypertension) 06/30/2019: Lab test positive for detection of COVID-19 virus  Past Surgical History: 7-8 years ago: COLONOSCOPY     Comment:  [facility] 04/03/2020: CYSTOSCOPY; Left     Comment:  Procedure: CYSTOSCOPY Birdena Crandall;  Surgeon: [name], MD;  Location: [facility] ORS;  Service: Urology;                Laterality: Left; 05/2014, 06/2014: EYE SURGERY     Comment:  cataracts 01/19/2015: INGUINAL HERNIA REPAIR; Right     Comment:  Procedure: RIGHT INCARCERATED INGUINAL HERNIA REPAIR;                Surgeon: Christene Lye, MD;  Location: [facility] ORS;               Service: General;  Laterality: Right; No date:  TONSILLECTOMY 04/03/2020: XI ROBOTIC ASSISTED INGUINAL HERNIA REPAIR WITH MESH; Left     Comment:  Procedure: XI ROBOTIC ASSISTED INGUINAL HERNIA REPAIR               WITH MESH, possible bilateral;  Surgeon: Jules Husbands,               MD;  Location: [facility] ORS;  Service: General;  Laterality:               Left;  BMI    Body Mass Index: 40.61 kg/m      Reproductive/Obstetrics negative OB ROS                             Anesthesia Physical Anesthesia Plan  ASA: III  Anesthesia Plan: General LMA   Post-op Pain Management:    Induction: Intravenous  PONV Risk Score and Plan: Ondansetron, Dexamethasone, Midazolam and Treatment may vary due to age or medical condition  Airway Management Planned: LMA  Additional Equipment:   Intra-op Plan:   Post-operative Plan: Extubation in OR  Informed Consent: I have reviewed the patients History and Physical, chart, labs and discussed the procedure including the risks, benefits and alternatives for the proposed anesthesia with the patient or authorized representative who has indicated his/her  understanding and acceptance.     Dental Advisory Given  Plan Discussed with: Anesthesiologist, CRNA and Surgeon  Anesthesia Plan Comments: (Patient consented for risks of anesthesia including but not limited to:  - adverse reactions to medications - damage to eyes, teeth, lips or other oral mucosa - nerve damage due to positioning  - sore throat or hoarseness - Damage to heart, brain, nerves, lungs, other parts of body or loss of life  Patient voiced understanding.)       Anesthesia Quick Evaluation

## 2020-08-07 NOTE — Interval H&P Note (Signed)
UROLOGY H&P UPDATE  Agree with prior H&P dated 07/15/2020.  Cardiac: RRR Lungs: CTA bilaterally  Laterality: Right Procedure: Hydrocelectomy  Informed consent obtained, we specifically discussed the risks of bleeding, infection, long healing time, post-operative pain, risk of recurrence, need for additional procedures.  [name], MD 08/07/2020

## 2020-08-08 ENCOUNTER — Encounter: Payer: Self-pay | Admitting: Urology

## 2020-08-10 ENCOUNTER — Telehealth: Payer: Self-pay | Admitting: Urology

## 2020-08-10 NOTE — Progress Notes (Signed)
Patient underwent a right hydrocelectomy with Dr. Diamantina Providence on August 07, 2020.  Intraoperative findings noted a thickened fibrotic rind a moderate size right hydrocele and a Jaboulay repair could not be performed.  His postprocedural course was as expected and uneventful.    Constitutional:  Well nourished. Alert and oriented, No acute distress. HEENT: [place] AT, mask in place.  Trachea midline Cardiovascular: No clubbing, cyanosis, or edema. Respiratory: Normal respiratory effort, no increased work of breathing. GU: Penrose drain was found already removed but still secured by the drain stitch.  Drain stitch and penrose tubing were cut from the the scrotum.  Scrotum postoperative edema.  Incision is clean and dry.  No erythema, fluctuant mass, crepitus, drainage or tenderness is noted.  Normal postoperative induration is noted.  Testicles are located scrotally bilaterally. No masses are appreciated in the testicles. Left and right epididymis are normal.  Patient advised to contact the office if he should experience any uncontrolled scrotal pain, fever/chills, erythema, purulent drainage or increased scrotal swelling.  He will continue to wear supportive undergarments.  He will follow-up as scheduled with Dr. Diamantina Providence on September 10, 2020.

## 2020-08-10 NOTE — Telephone Encounter (Signed)
He needs an appointment with me either tomorrow or Wednesday for drain removal.

## 2020-08-10 NOTE — Telephone Encounter (Signed)
Appointment made

## 2020-08-11 ENCOUNTER — Ambulatory Visit (INDEPENDENT_AMBULATORY_CARE_PROVIDER_SITE_OTHER): Payer: Medicare HMO | Admitting: Urology

## 2020-08-11 ENCOUNTER — Other Ambulatory Visit: Payer: Self-pay

## 2020-08-11 ENCOUNTER — Encounter: Payer: Self-pay | Admitting: Urology

## 2020-08-11 VITALS — BP 122/79 | HR 98 | Ht 69.0 in | Wt 270.0 lb

## 2020-08-11 DIAGNOSIS — N432 Other hydrocele: Secondary | ICD-10-CM

## 2020-08-24 ENCOUNTER — Other Ambulatory Visit
Admission: RE | Admit: 2020-08-24 | Discharge: 2020-08-24 | Disposition: A | Discharge status: Home / Self Care | Payer: Medicare HMO | Source: Ambulatory Visit | Attending: Internal Medicine | Admitting: Internal Medicine

## 2020-08-24 ENCOUNTER — Other Ambulatory Visit: Payer: Self-pay

## 2020-08-24 DIAGNOSIS — Z01812 Encounter for preprocedural laboratory examination: Secondary | ICD-10-CM | POA: Diagnosis present

## 2020-08-24 DIAGNOSIS — Z20822 Contact with and (suspected) exposure to covid-19: Secondary | ICD-10-CM | POA: Insufficient documentation

## 2020-08-25 ENCOUNTER — Encounter: Payer: Self-pay | Admitting: Internal Medicine

## 2020-08-25 LAB — SARS CORONAVIRUS 2 (TAT 6-24 HRS): SARS Coronavirus 2: NEGATIVE

## 2020-08-26 ENCOUNTER — Ambulatory Visit
Admission: RE | Admit: 2020-08-26 | Discharge: 2020-08-26 | Disposition: A | Discharge status: Home / Self Care | Payer: Medicare HMO | Source: Ambulatory Visit | Attending: Internal Medicine | Admitting: Internal Medicine

## 2020-08-26 ENCOUNTER — Ambulatory Visit: Payer: Medicare HMO | Admitting: Certified Registered"

## 2020-08-26 ENCOUNTER — Encounter: Payer: Self-pay | Admitting: Internal Medicine

## 2020-08-26 ENCOUNTER — Encounter
Admission: RE | Disposition: A | Discharge status: Home / Self Care | Payer: Self-pay | Source: Ambulatory Visit | Attending: Internal Medicine

## 2020-08-26 ENCOUNTER — Other Ambulatory Visit: Payer: Self-pay

## 2020-08-26 DIAGNOSIS — Z86718 Personal history of other venous thrombosis and embolism: Secondary | ICD-10-CM | POA: Insufficient documentation

## 2020-08-26 DIAGNOSIS — Z7901 Long term (current) use of anticoagulants: Secondary | ICD-10-CM | POA: Diagnosis not present

## 2020-08-26 DIAGNOSIS — Z8616 Personal history of COVID-19: Secondary | ICD-10-CM | POA: Insufficient documentation

## 2020-08-26 DIAGNOSIS — Z1211 Encounter for screening for malignant neoplasm of colon: Secondary | ICD-10-CM | POA: Insufficient documentation

## 2020-08-26 DIAGNOSIS — K6389 Other specified diseases of intestine: Secondary | ICD-10-CM | POA: Insufficient documentation

## 2020-08-26 DIAGNOSIS — I480 Paroxysmal atrial fibrillation: Secondary | ICD-10-CM | POA: Diagnosis not present

## 2020-08-26 DIAGNOSIS — D123 Benign neoplasm of transverse colon: Secondary | ICD-10-CM | POA: Diagnosis not present

## 2020-08-26 DIAGNOSIS — K5730 Diverticulosis of large intestine without perforation or abscess without bleeding: Secondary | ICD-10-CM | POA: Insufficient documentation

## 2020-08-26 DIAGNOSIS — Z6839 Body mass index (BMI) 39.0-39.9, adult: Secondary | ICD-10-CM | POA: Diagnosis not present

## 2020-08-26 DIAGNOSIS — Z79899 Other long term (current) drug therapy: Secondary | ICD-10-CM | POA: Diagnosis not present

## 2020-08-26 DIAGNOSIS — K640 First degree hemorrhoids: Secondary | ICD-10-CM | POA: Insufficient documentation

## 2020-08-26 DIAGNOSIS — I10 Essential (primary) hypertension: Secondary | ICD-10-CM | POA: Diagnosis not present

## 2020-08-26 HISTORY — PX: COLONOSCOPY WITH PROPOFOL: SHX5780

## 2020-08-26 HISTORY — DX: Localized edema: R60.0

## 2020-08-26 HISTORY — DX: Cardiac arrhythmia, unspecified: I49.9

## 2020-08-26 HISTORY — DX: Thrombocytopenia, unspecified: D69.6

## 2020-08-26 HISTORY — DX: Cyst of kidney, acquired: N28.1

## 2020-08-26 HISTORY — DX: Morbid (severe) obesity due to excess calories: E66.01

## 2020-08-26 SURGERY — COLONOSCOPY WITH PROPOFOL
Anesthesia: General

## 2020-08-26 MED ORDER — PROPOFOL 10 MG/ML IV BOLUS
INTRAVENOUS | Status: DC | PRN
  Administered 2020-08-26: 80 mg via INTRAVENOUS

## 2020-08-26 MED ORDER — PROPOFOL 500 MG/50ML IV EMUL
INTRAVENOUS | Status: DC | PRN
  Administered 2020-08-26: 130 ug/kg/min via INTRAVENOUS

## 2020-08-26 MED ORDER — SODIUM CHLORIDE 0.9 % IV SOLN
INTRAVENOUS | Status: DC
  Administered 2020-08-26: 1000 mL via INTRAVENOUS

## 2020-08-26 MED ORDER — PHENYLEPHRINE HCL (PRESSORS) 10 MG/ML IV SOLN
INTRAVENOUS | Status: DC | PRN
  Administered 2020-08-26: 100 ug via INTRAVENOUS

## 2020-08-26 NOTE — H&P (Signed)
Outpatient short stay form Pre-procedure 08/26/2020 9:53 AM Neil Bailey K. Alice Reichert, M.D.  Primary Physician: Emily Filbert, M.D.  Reason for visit:  Colon cancer screening  History of present illness: Patient presents for colonoscopy for colon cancer screening. The patient denies complaints of abdominal pain, significant change in bowel habits, or rectal bleeding.      Current Facility-Administered Medications:  .  0.9 %  sodium chloride infusion, , Intravenous, Continuous, [place], Benay Pike, MD, Last Rate: 20 mL/hr at 08/26/20 0946, Continued from Pre-op at 08/26/20 0946  Medications Prior to Admission  Medication Sig Dispense Refill Last Dose  . furosemide (LASIX) 20 MG tablet Take 20 mg by mouth daily.   08/25/2020 at Unknown time  . hydrochlorothiazide (HYDRODIURIL) 25 MG tablet Take 25 mg by mouth daily.   08/25/2020 at Unknown time  . metoprolol succinate (TOPROL-XL) 50 MG 24 hr tablet Take 50 mg by mouth every morning.    08/25/2020 at Unknown time  . polyethylene glycol (GOLYTELY) 236 g solution Take 4,000 mLs by mouth once.   08/25/2020 at Unknown time  . potassium chloride (KLOR-CON) 10 MEQ tablet Take 10 mEq by mouth daily.   08/25/2020 at Unknown time  . warfarin (COUMADIN) 5 MG tablet Take 5-7.5 mg by mouth See admin instructions. 5 mg on Wed and Sat, all other days 7.5 mg   08/22/2020     Allergies  Allergen Reactions  . Plasma, Human Shortness Of Breath, Anxiety, Other (See Comments), Cough and Hypertension    Redness     Past Medical History:  Diagnosis Date  . Aortic atherosclerosis ([place]) 03/09/2020   Noted on abdominal CT  . Atrial fibrillation ([place])   . COVID-19 06/2019  . DVT (deep venous thrombosis) ([place])   . Dyspnea    since covid dec 2020  . Dysrhythmia    paroxysmal atrial fibrillation  . HTN (hypertension)   . Lab test positive for detection of COVID-19 virus 06/30/2019  . Morbid obesity ([place])   . Pedal edema   . Renal cyst, acquired, right   .  Thrombocytopenia ([place])     Review of systems:  Otherwise negative.    Physical Exam  Gen: Alert, oriented. Appears stated age.  HEENT: [place]/AT. PERRLA. Lungs: CTA, no wheezes. CV: RR nl S1, S2. Abd: soft, benign, no masses. BS+ Ext: No edema. Pulses 2+    Planned procedures: Proceed with colonoscopy. The patient understands the nature of the planned procedure, indications, risks, alternatives and potential complications including but not limited to bleeding, infection, perforation, damage to internal organs and possible oversedation/side effects from anesthesia. The patient agrees and gives consent to proceed.  Please refer to procedure notes for findings, recommendations and patient disposition/instructions.     Taylormarie Register K. Alice Reichert, M.D. Gastroenterology 08/26/2020  9:53 AM

## 2020-08-26 NOTE — Anesthesia Preprocedure Evaluation (Signed)
Anesthesia Evaluation  Patient identified by MRN, date of birth, ID band Patient awake    Reviewed: Allergy & Precautions, NPO status , Patient's Chart, lab work & pertinent test results  History of Anesthesia Complications Negative for: history of anesthetic complications  Airway Mallampati: III  TM Distance: <3 FB Neck ROM: Full    Dental no notable dental hx. (+) Teeth Intact   Pulmonary shortness of breath and at rest, neg sleep apnea, pneumonia, resolved, neg COPD, Patient abstained from smoking.Not current smoker, former smoker,  Covid pneumonia in past, with lingering SOB since then. Not on home o2   Pulmonary exam normal breath sounds clear to auscultation       Cardiovascular Exercise Tolerance: Poor METShypertension, (-) CAD and (-) Past MI + dysrhythmias Atrial Fibrillation  Rhythm:Regular Rate:Normal - Systolic murmurs    Neuro/Psych negative neurological ROS  negative psych ROS   GI/Hepatic neg GERD  ,(+)     (-) substance abuse  ,   Endo/Other  neg diabetesMorbid obesity  Renal/GU negative Renal ROS     Musculoskeletal   Abdominal (+) + obese,   Peds  Hematology   Anesthesia Other Findings Past Medical History: 03/09/2020: Aortic atherosclerosis (HCC)     Comment:  Noted on abdominal CT No date: Atrial fibrillation ([place]) 06/2019: COVID-19 No date: DVT (deep venous thrombosis) (HCC) No date: Dyspnea     Comment:  since covid dec 2020 No date: Dysrhythmia     Comment:  paroxysmal atrial fibrillation No date: HTN (hypertension) 06/30/2019: Lab test positive for detection of COVID-19 virus No date: Morbid obesity ([place]) No date: Pedal edema No date: Renal cyst, acquired, right No date: Thrombocytopenia ([place])  Reproductive/Obstetrics                             Anesthesia Physical Anesthesia Plan  ASA: III  Anesthesia Plan: General   Post-op Pain Management:     Induction: Intravenous  PONV Risk Score and Plan: 2 and Ondansetron, Propofol infusion and TIVA  Airway Management Planned: Nasal Cannula  Additional Equipment: None  Intra-op Plan:   Post-operative Plan:   Informed Consent: I have reviewed the patients History and Physical, chart, labs and discussed the procedure including the risks, benefits and alternatives for the proposed anesthesia with the patient or authorized representative who has indicated his/her understanding and acceptance.     Dental advisory given  Plan Discussed with: CRNA and Surgeon  Anesthesia Plan Comments: (Discussed risks of anesthesia with patient, including possibility of difficulty with spontaneous ventilation under anesthesia necessitating airway intervention, PONV, and rare risks such as cardiac or respiratory or neurological events. Patient understands. Patient informed about increased incidence of above perioperative risk due to high BMI. Patient understands. )        Anesthesia Quick Evaluation

## 2020-08-26 NOTE — Anesthesia Postprocedure Evaluation (Signed)
Anesthesia Post Note  Patient: Neil Bailey  Procedure(s) Performed: COLONOSCOPY WITH PROPOFOL (N/A )  Patient location during evaluation: Endoscopy Anesthesia Type: General Level of consciousness: awake Pain management: pain level controlled Vital Signs Assessment: post-procedure vital signs reviewed and stable Respiratory status: spontaneous breathing Cardiovascular status: stable Postop Assessment: no apparent nausea or vomiting Anesthetic complications: no Comments: Held in recovery for hypotension.  Waited for patient to consistently maintain a SBP at or above 100 with no signs of hypotension such as dizziness.    No complications documented.   Last Vitals:  Vitals:   08/26/20 1030 08/26/20 1050  BP: (!) 99/55 103/74  Pulse: (!) 112 87  Resp: (!) 22 (!) 27  Temp: 36.9 C   SpO2: 97% 99%    Last Pain:  Vitals:   08/26/20 1030  TempSrc: Temporal  PainSc:                  Neva Seat

## 2020-08-26 NOTE — Interval H&P Note (Signed)
History and Physical Interval Note:  08/26/2020 9:54 AM  Neil Bailey  has presented today for surgery, with the diagnosis of COLON CANCER SCREENING.  The various methods of treatment have been discussed with the patient and family. After consideration of risks, benefits and other options for treatment, the patient has consented to  Procedure(s): COLONOSCOPY WITH PROPOFOL (N/A) as a surgical intervention.  The patient's history has been reviewed, patient examined, no change in status, stable for surgery.  I have reviewed the patient's chart and labs.  Questions were answered to the patient's satisfaction.     [place], [place]

## 2020-08-26 NOTE — Op Note (Signed)
[facility] Gastroenterology Patient Name: Neil Bailey Procedure Date: 08/26/2020 10:02 AM MRN: [id number] Account #: [id number] Date of Birth: [birth year] Admit Type: Outpatient Age: 70 Room: [facility] ENDO ROOM 2 Gender: Male Note Status: Finalized Procedure:             Colonoscopy Indications:           Screening for colorectal malignant neoplasm Providers:             Benay Pike. Alice Reichert MD, MD Referring MD:          [name], MD (Referring MD) Medicines:             Propofol per Anesthesia Complications:         No immediate complications. Procedure:             Pre-Anesthesia Assessment:                        - The risks and benefits of the procedure and the                         sedation options and risks were discussed with the                         patient. All questions were answered and informed                         consent was obtained.                        - Patient identification and proposed procedure were                         verified prior to the procedure by the nurse. The                         procedure was verified in the procedure room.                        - ASA Grade Assessment: III - A patient with severe                         systemic disease.                        - After reviewing the risks and benefits, the patient                         was deemed in satisfactory condition to undergo the                         procedure.                        After obtaining informed consent, the colonoscope was                         passed under direct vision. Throughout the procedure,                         the patient's blood pressure,  pulse, and oxygen                         saturations were monitored continuously. The                         Colonoscope was introduced through the anus and                         advanced to the the cecum, identified by appendiceal                         orifice and ileocecal  valve. The colonoscopy was                         performed without difficulty. The patient tolerated                         the procedure well. The quality of the bowel                         preparation was good. The ileocecal valve, appendiceal                         orifice, and rectum were photographed. Findings:      The perianal and digital rectal examinations were normal. Pertinent       negatives include normal sphincter tone and no palpable rectal lesions.      Non-bleeding internal hemorrhoids were found during retroflexion. The       hemorrhoids were Grade I (internal hemorrhoids that do not prolapse).      Many small and large-mouthed diverticula were found in the left colon.      A 6 mm polyp was found in the transverse colon. The polyp was sessile.       The polyp was removed with a jumbo cold forceps. Resection and retrieval       were complete.      A diffuse area of mild melanosis was found in the descending colon.       Biopsies were taken with a cold forceps for histology.      The exam was otherwise without abnormality. Impression:            - Non-bleeding internal hemorrhoids.                        - Diverticulosis in the left colon.                        - One 6 mm polyp in the transverse colon, removed with                         a jumbo cold forceps. Resected and retrieved.                        - Melanosis in the colon. Biopsied.                        - The examination was otherwise normal. Recommendation:        - Patient has a contact number available for  emergencies. The signs and symptoms of potential                         delayed complications were discussed with the patient.                         Return to normal activities tomorrow. Written                         discharge instructions were provided to the patient.                        - Resume previous diet.                        - Continue present medications.                         - Repeat colonoscopy is recommended for surveillance.                         The colonoscopy date will be determined after                         pathology results from today's exam become available                         for review.                        - Return to GI office PRN.                        - The findings and recommendations were discussed with                         the patient. Procedure Code(s):     --- Professional ---                        [phone], Colonoscopy, flexible; with biopsy, single or                         multiple Diagnosis Code(s):     --- Professional ---                        K57.30, Diverticulosis of large intestine without                         perforation or abscess without bleeding                        K63.89, Other specified diseases of intestine                        K63.5, Polyp of colon                        K64.0, First degree hemorrhoids                        Z12.11, Encounter for screening for  malignant neoplasm                         of colon CPT copyright 2019 [employer]. All rights reserved. The codes documented in this report are preliminary and upon coder review may  be revised to meet current compliance requirements. Efrain Sella MD, MD 08/26/2020 10:27:36 AM This report has been signed electronically. Number of Addenda: 0 Note Initiated On: 08/26/2020 10:02 AM Scope Withdrawal Time: 0 hours 8 minutes 45 seconds  Total Procedure Duration: 0 hours 14 minutes 35 seconds  Estimated Blood Loss:  Estimated blood loss: none.      [facility]

## 2020-08-26 NOTE — Transfer of Care (Signed)
Immediate Anesthesia Transfer of Care Note  Patient: Neil Bailey  Procedure(s) Performed: COLONOSCOPY WITH PROPOFOL (N/A )  Patient Location: PACU  Anesthesia Type:General  Level of Consciousness: drowsy  Airway & Oxygen Therapy: Patient Spontanous Breathing  Post-op Assessment: Report given to RN  Post vital signs: stable  Last Vitals:  Vitals Value Taken Time  BP    Temp    Pulse    Resp    SpO2      Last Pain:  Vitals:   08/26/20 0851  TempSrc: Temporal  PainSc: 0-No pain         Complications: No complications documented.

## 2020-08-27 ENCOUNTER — Encounter: Payer: Self-pay | Admitting: Internal Medicine

## 2020-08-27 LAB — SURGICAL PATHOLOGY

## 2020-09-10 ENCOUNTER — Encounter: Payer: Self-pay | Admitting: Urology

## 2020-09-10 ENCOUNTER — Ambulatory Visit: Payer: Medicare HMO | Admitting: Urology

## 2020-09-10 ENCOUNTER — Other Ambulatory Visit: Payer: Self-pay

## 2020-09-10 VITALS — BP 117/74 | HR 85 | Ht 69.0 in | Wt 275.0 lb

## 2020-09-10 DIAGNOSIS — N433 Hydrocele, unspecified: Secondary | ICD-10-CM

## 2020-09-10 NOTE — Progress Notes (Signed)
   09/10/2020 5:06 PM   Dwana Curd 06/08/1951 [id number]  Reason for visit: Follow up right hydrocelectomy  HPI: 70 year old male with morbid obesity and BMI of 41 and long history of bilateral scrotal swelling.  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 Dahlia Byes.  He had persistent right-sided scrotal swelling with ultrasound showing a large hydrocele, and he underwent a hydrocelectomy with me on 08/07/2020.  This was extremely scarred down and stuck with the significant amount of fibrosis and inflammation.  Penrose drain was removed 4 days postop.  On exam, his right scrotal incision is well-healed and there is no purulence or erythema.  Overall he is doing well.  Improved significantly from prior to surgery.  I provided reassurance at length that he had significant scarring during surgery and this will likely take 3 to 4 months to totally resolve in terms of healing, and is at high risk for recurrence.  Continue snug fitting underwear, RTC 6 months symptom check    [name], MD  [facility] [address], [place], [place] [address] [phone]

## 2021-03-17 ENCOUNTER — Other Ambulatory Visit: Payer: Self-pay

## 2021-03-17 ENCOUNTER — Ambulatory Visit: Payer: Medicare HMO | Admitting: Urology

## 2021-03-17 ENCOUNTER — Encounter: Payer: Self-pay | Admitting: Urology

## 2021-03-17 VITALS — BP 132/81 | HR 90 | Ht 69.0 in | Wt 275.0 lb

## 2021-03-17 DIAGNOSIS — N5089 Other specified disorders of the male genital organs: Secondary | ICD-10-CM

## 2021-03-17 NOTE — Progress Notes (Signed)
   03/17/2021 1:37 PM   Dwana Curd [birth year] [id number]  Reason for visit: Follow up right hydrocelectomy, left inguinal hernia repair  HPI: 70 year old male with morbid obesity and BMI of 41 with history notable for right inguinal hernia repair with bladder herniation in 2016 with Dr. Jamal Collin with placement of mesh, left inguinal hernia with herniation of the bladder and repair with Dr Dahlia Byes in September 2021, and bothersome right large hydrocele status post right hydrocelectomy with me in January 2022.  He overall has been doing well.  The right-sided scrotal swelling is almost completely resolved and he denies any right-sided scrotal pain.  He has noticed a new left-sided mass in the upper part of the left scrotum that is nontender.  On exam, there is a well-healed incision on the right side from the hydrocelectomy.  No tenderness.  Large 8 to 10 cm left spherical mass that is nontender in the left upper scrotum of unclear etiology.  I recommended a scrotal ultrasound for further evaluation of this new left sided large nontender mass in the upper scrotum, will call with his results   [name], [place] [address], [place], [place] [address] [phone]

## 2021-04-13 ENCOUNTER — Other Ambulatory Visit: Payer: Self-pay | Admitting: Urology

## 2021-04-13 DIAGNOSIS — N5089 Other specified disorders of the male genital organs: Secondary | ICD-10-CM

## 2021-04-15 ENCOUNTER — Other Ambulatory Visit: Payer: Self-pay

## 2021-04-15 ENCOUNTER — Ambulatory Visit
Admission: RE | Admit: 2021-04-15 | Discharge: 2021-04-15 | Disposition: A | Discharge status: Home / Self Care | Payer: Medicare HMO | Source: Ambulatory Visit | Attending: Urology | Admitting: Urology

## 2021-04-15 DIAGNOSIS — N5089 Other specified disorders of the male genital organs: Secondary | ICD-10-CM | POA: Insufficient documentation

## 2021-04-15 IMAGING — US US SCROTUM W/ DOPPLER COMPLETE
1 series · 14 of 25 positions shown · non-contrast
Comparison: [DATE]

CLINICAL DATA: Left scrotal mass

EXAM:
SCROTAL ULTRASOUND
DOPPLER ULTRASOUND OF THE TESTICLES
TECHNIQUE: Complete ultrasound examination of the testicles, epididymis, and
other scrotal structures was performed. Color and spectral Doppler
ultrasound were also utilized to evaluate blood flow to the
testicles.

[Series 1: us scrotum w/ doppler complete · 0.07mm/px · 14 of 65 slices shown]
[im 1/65]
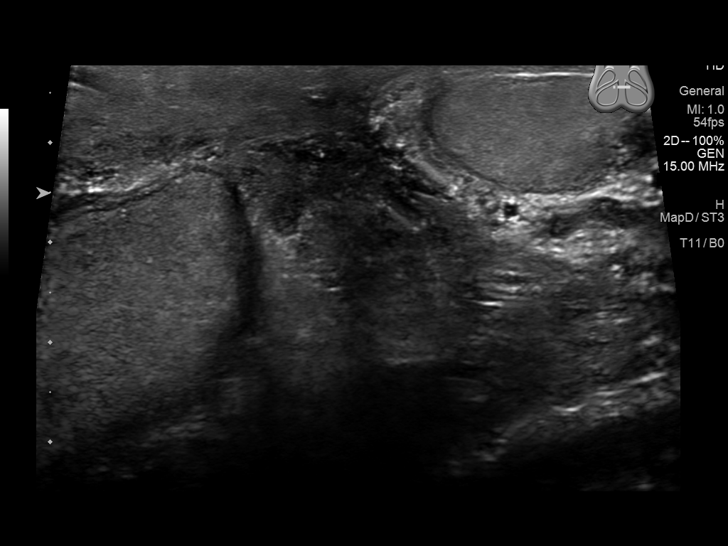
[im 6/65]
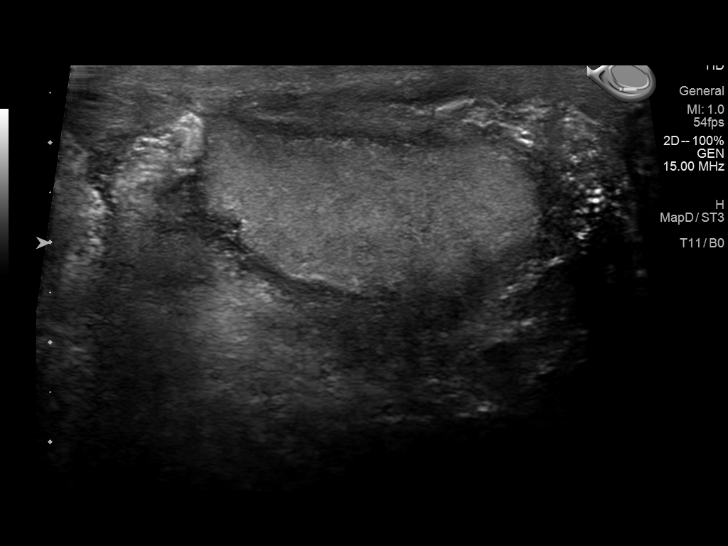
[im 11/65]
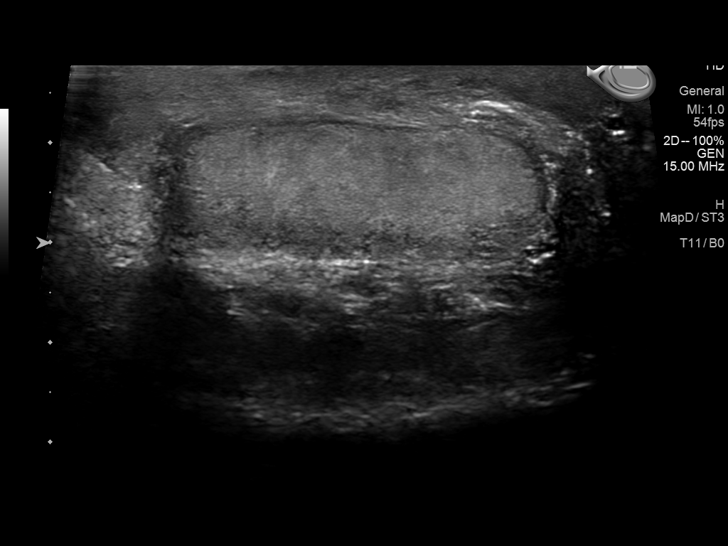
[im 17/65]
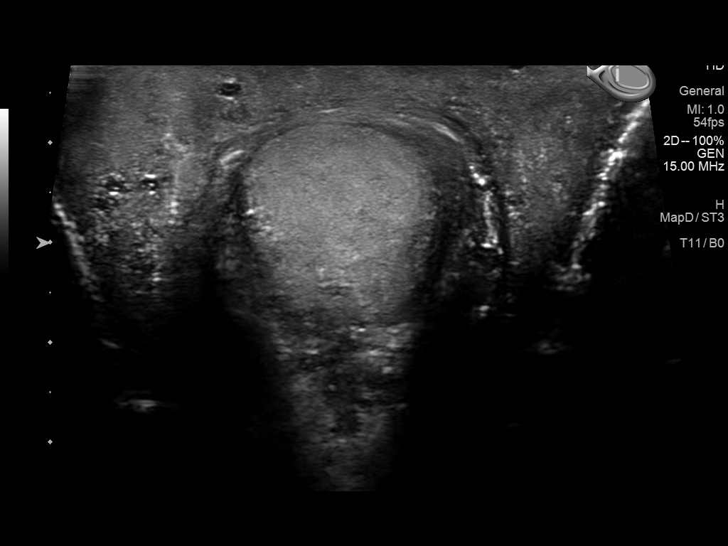
[im 22/65]
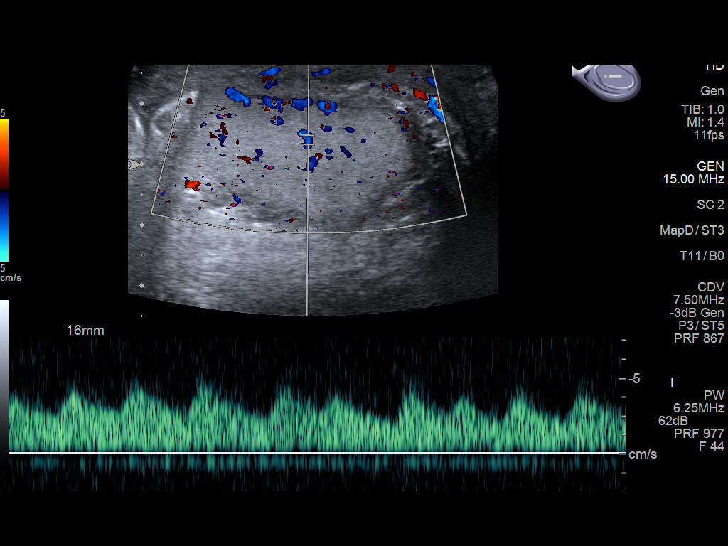
[im 25/65]
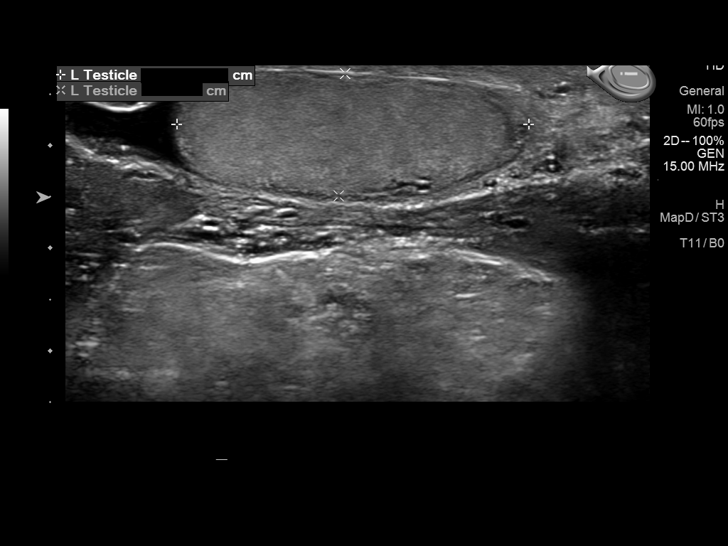
[im 30/65]
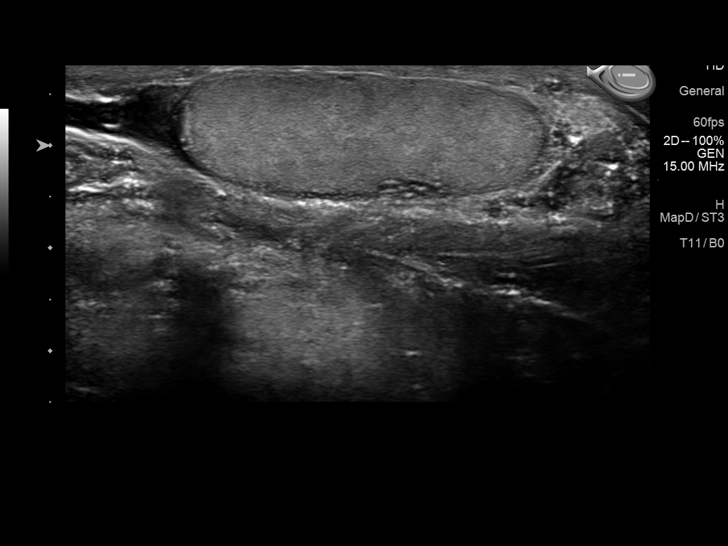
[im 35/65]
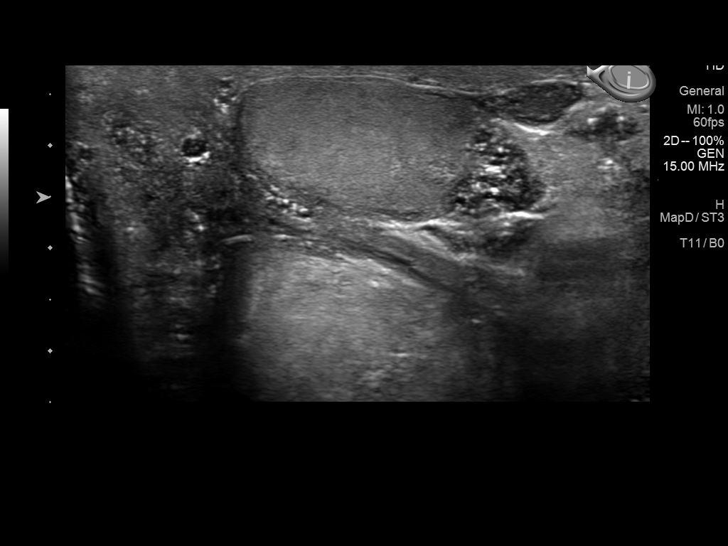
[im 41/65]
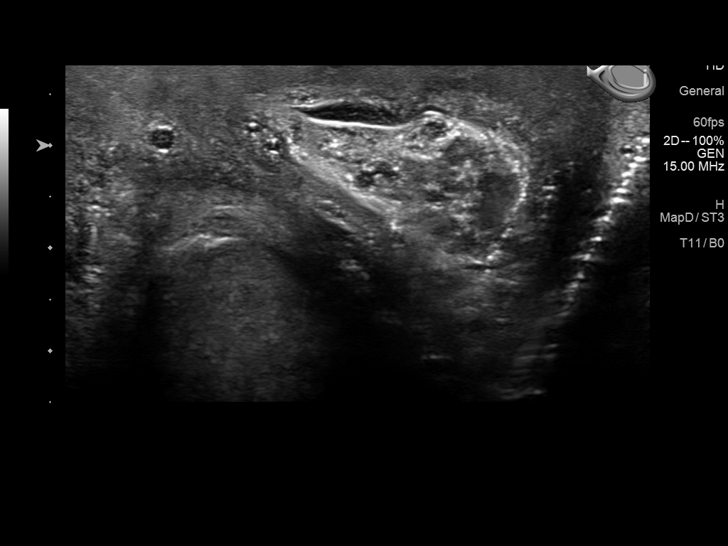
[im 43/65]
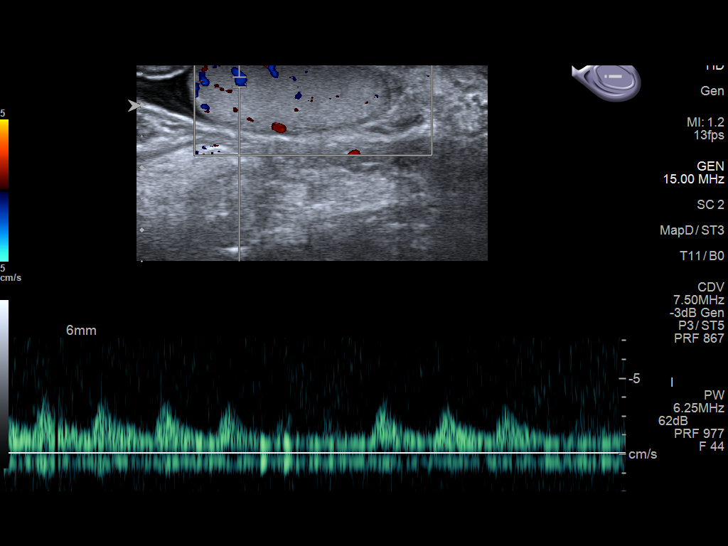
[im 49/65]
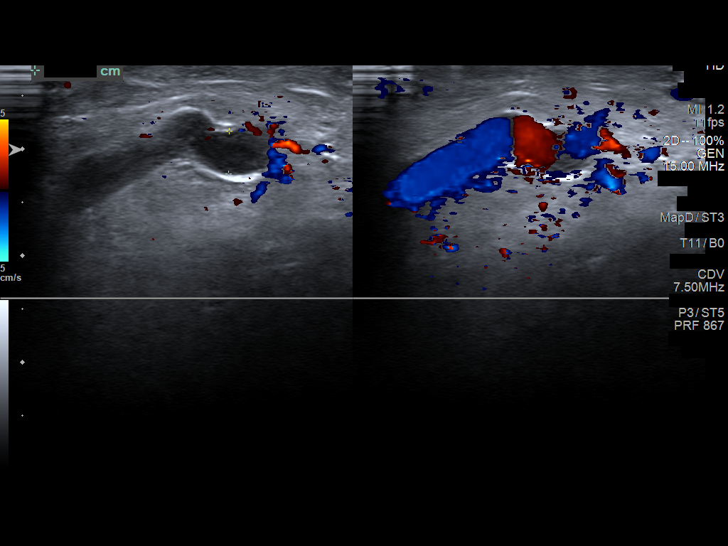
[im 54/65]
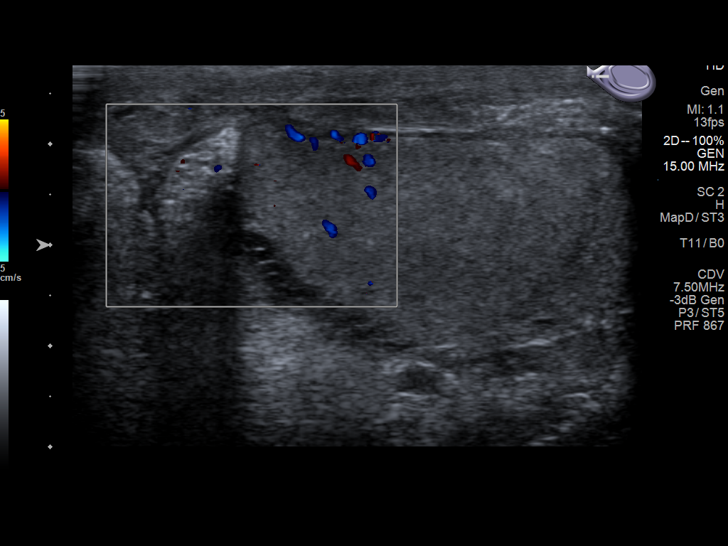
[im 59/65]
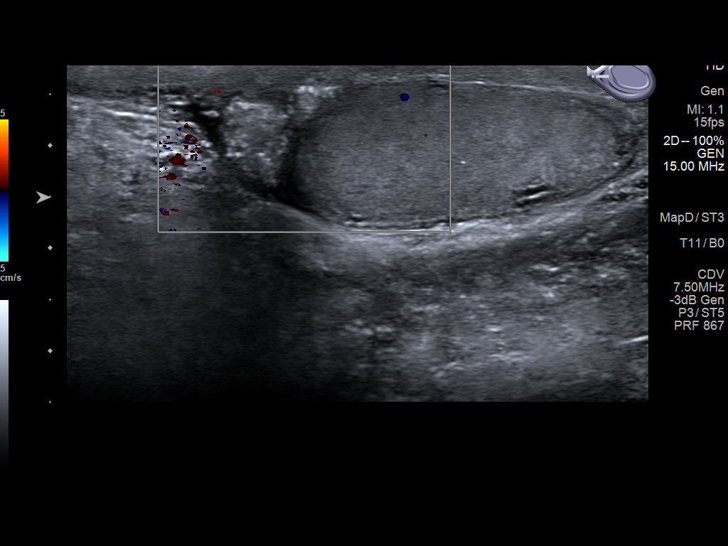
[im 65/65]
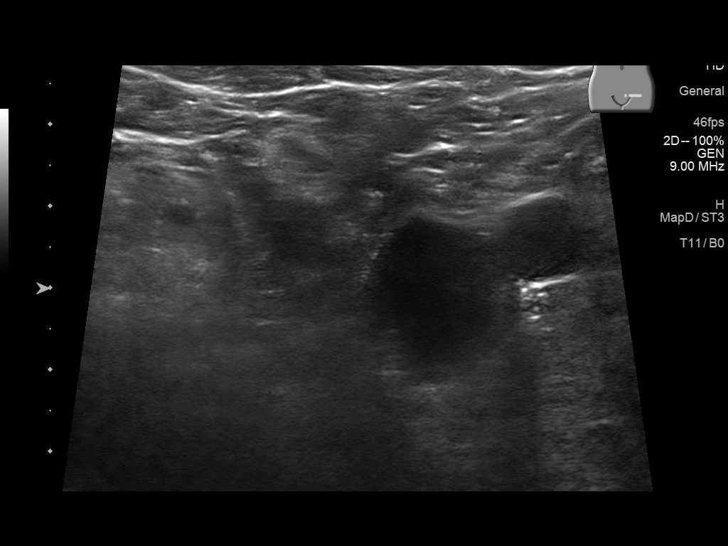

[14 of 25 positions shown; findings below may reference images not displayed]

FINDINGS: Right testicle

Measurements: 4.3 x 2.1 x 2.2 cm. No mass or microlithiasis
visualized. Diffuse scrotal skin thickening

Left testicle

Measurements: 3.4 x 1.2 x 2.4 cm. No mass or microlithiasis
visualized.

Right epididymis:  Normal in size and appearance.

Left epididymis:  Normal in size and appearance.

Hydrocele:  Small left hydrocele.

Varicocele:  Small left varicocele.

Pulsed Doppler interrogation of both testes demonstrates normal low
resistance arterial and venous waveforms bilaterally.
IMPRESSION: 1. Negative for testicular torsion or intratesticular mass.
2. Small left hydrocele.  Small left varicocele.
3. Diffuse scrotal wall thickening greater on the right side

## 2021-04-20 ENCOUNTER — Telehealth: Payer: Self-pay

## 2021-04-20 NOTE — Telephone Encounter (Signed)
Called pt informed him of the information below. Pt voiced understanding. 71yr f/u scheduled.

## 2021-04-20 NOTE — Telephone Encounter (Signed)
-----   Message from [name], MD sent at 04/20/2021  8:32 AM EDT ----- Good news, scrotal ultrasound essentially normal, would recommend 1 year follow-up for symptom check  [name], MD 04/20/2021

## 2021-09-27 ENCOUNTER — Other Ambulatory Visit: Payer: Self-pay | Admitting: Neurology

## 2021-09-27 ENCOUNTER — Other Ambulatory Visit (HOSPITAL_COMMUNITY): Payer: Self-pay | Admitting: Neurology

## 2021-09-27 DIAGNOSIS — G20 Parkinson's disease: Secondary | ICD-10-CM

## 2021-09-27 DIAGNOSIS — R29810 Facial weakness: Secondary | ICD-10-CM

## 2021-10-03 ENCOUNTER — Ambulatory Visit
Admission: RE | Admit: 2021-10-03 | Discharge: 2021-10-03 | Disposition: A | Discharge status: Home / Self Care | Payer: Medicare HMO | Source: Ambulatory Visit | Attending: Neurology | Admitting: Neurology

## 2021-10-03 DIAGNOSIS — R29810 Facial weakness: Secondary | ICD-10-CM

## 2021-10-03 DIAGNOSIS — G20A1 Parkinson's disease without dyskinesia, without mention of fluctuations: Secondary | ICD-10-CM

## 2021-10-03 DIAGNOSIS — G20 Parkinson's disease: Secondary | ICD-10-CM | POA: Diagnosis present

## 2021-10-03 IMAGING — MR MR HEAD WO/W CM
13 series · 48 of 48 positions shown · IV contrast (10ml Gadavist)
Comparison: CT head [DATE]

CLINICAL DATA: Tremors with progressive memory loss 1 year

EXAM:
MRI HEAD WITHOUT AND WITH CONTRAST
TECHNIQUE: Multiplanar, multiecho pulse sequences of the brain and surrounding
structures were obtained without and with intravenous contrast.
CONTRAST:  10mL GADAVIST GADOBUTROL 1 MMOL/ML IV SOLN

[Series 5: ax dwi_tracew · axial · 3.0mm · 0.65mm/px · z∈[-71,+90]mm · 3 of 50 slices shown]
[im 1/50]
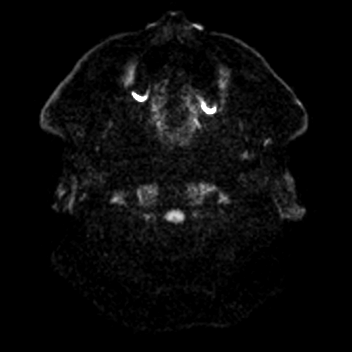
[im 25/50]
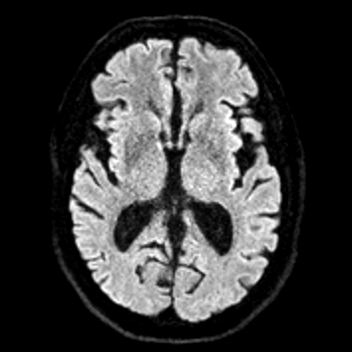
[im 50/50]
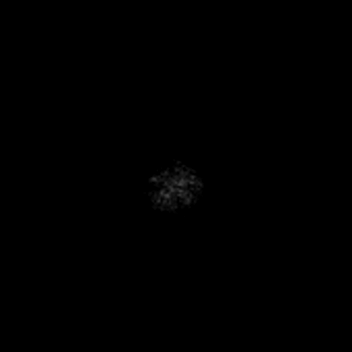

[Series 6: ax dwi_adc · axial · 3.0mm · 0.65mm/px · z∈[-71,+90]mm · 3 of 50 slices shown]
[im 1/50]
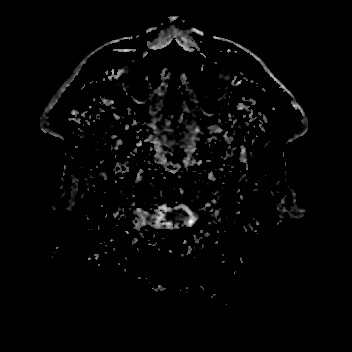
[im 25/50]
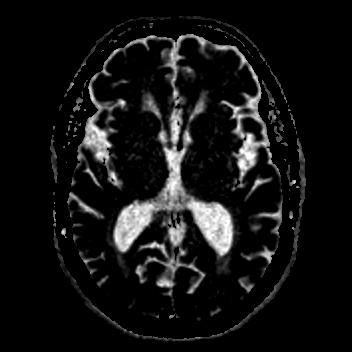
[im 50/50]
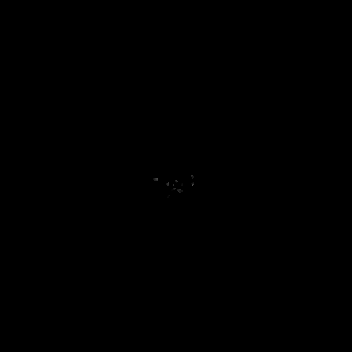

[Series 7: cor dwi_tracew · coronal · 5.0mm · 0.60mm/px · 2 of 40 slices shown]
[im 1/40]
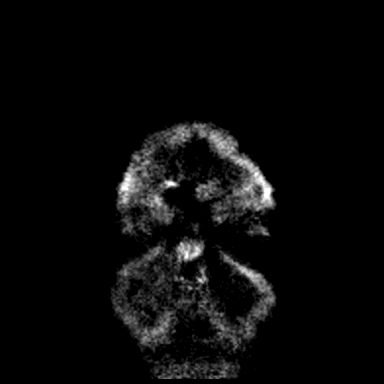
[im 40/40]
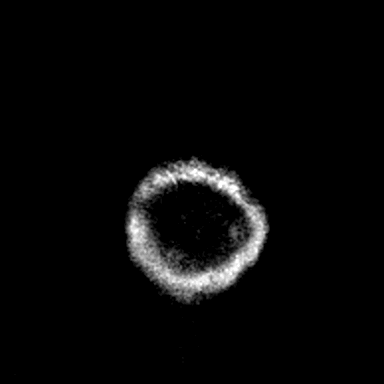

[Series 8: cor dwi_adc · coronal · 5.0mm · 0.60mm/px · 2 of 40 slices shown]
[im 1/40]
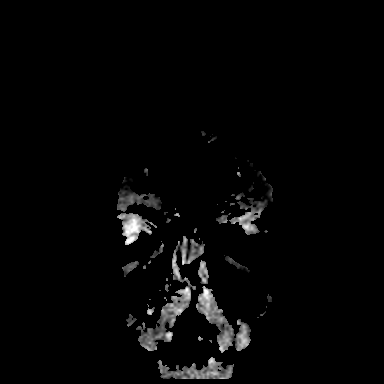
[im 40/40]
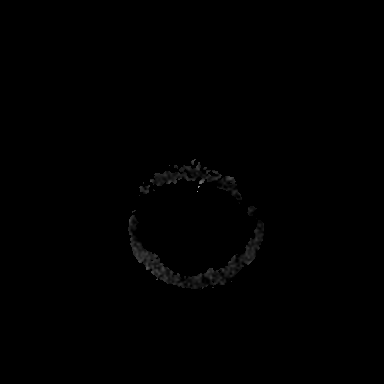

[Series 9: T1 · sagittal · 5.0mm · 0.62mm/px · 1 of 23 slices shown (1 of 2)]
[im 1/23]
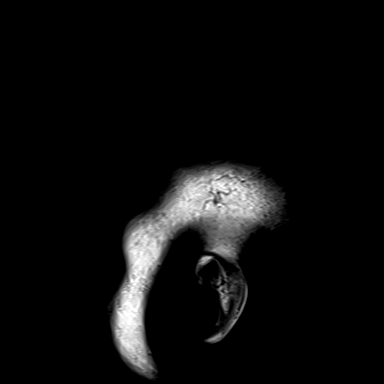

[Series 10: T2 · axial · 5.0mm · 0.53mm/px · z∈[-72,+89]mm · 2 of 28 slices shown]
[im 1/28]
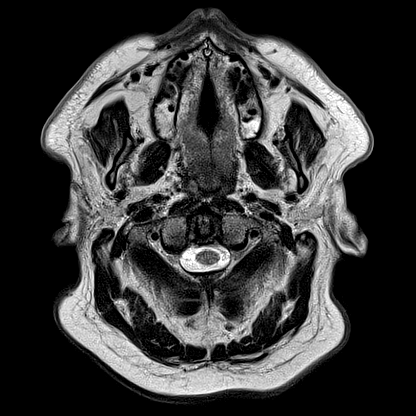
[im 28/28]
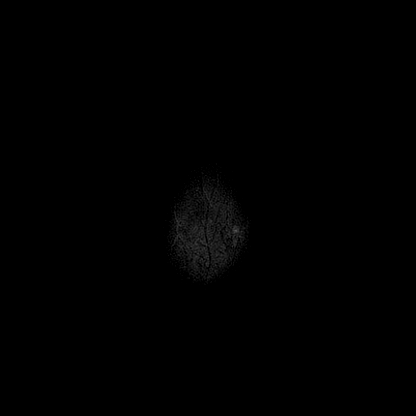

[Series 12: ax swi_pha · axial · 2.0mm · 0.90mm/px · z∈[-69,+88]mm · 4 of 80 slices shown]
[im 1/80]
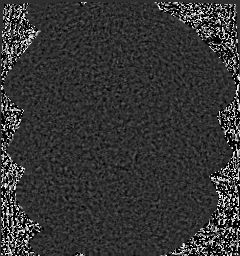
[im 27/80]
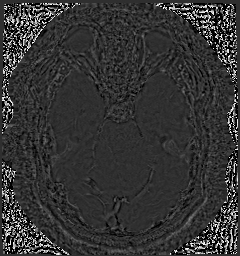
[im 53/80]
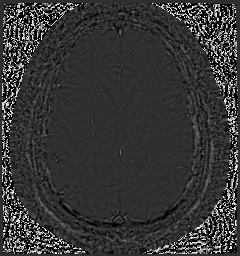
[im 80/80]
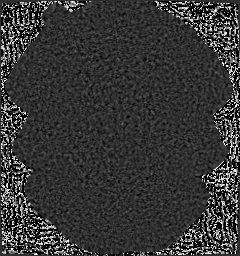

[Series 13: ax swi_swi · axial · 2.0mm · 0.90mm/px · z∈[-69,+88]mm · 4 of 80 slices shown]
[im 1/80]
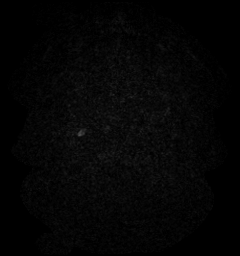
[im 27/80]
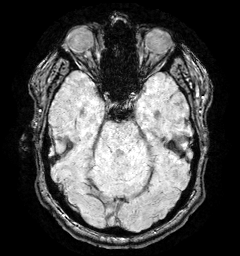
[im 53/80]
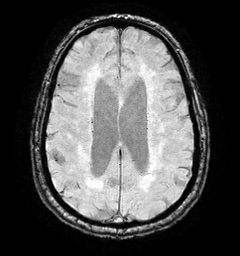
[im 80/80]
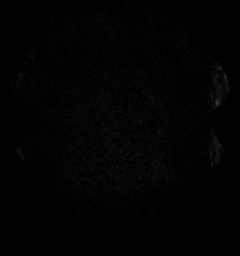

[Series 15: FLAIR · axial · 3.0mm · 0.53mm/px · z∈[-72,+89]mm · 3 of 55 slices shown]
[im 1/55]
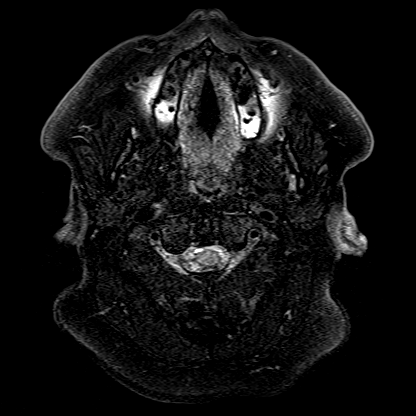
[im 28/55]
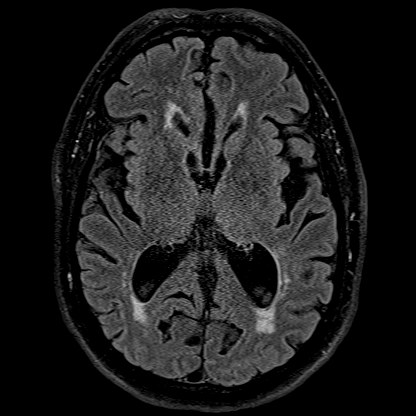
[im 55/55]
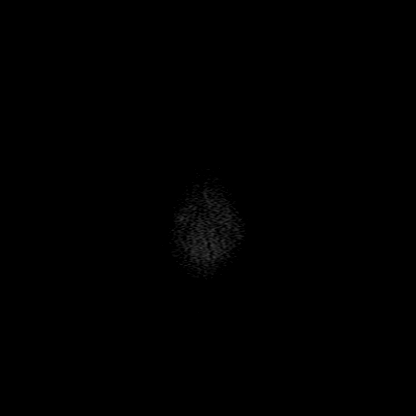

[Series 16: T1 · axial · 1.0mm · 0.98mm/px · z∈[-76,+98]mm · 10 of 176 slices shown (2 of 2)]
[im 1/176]
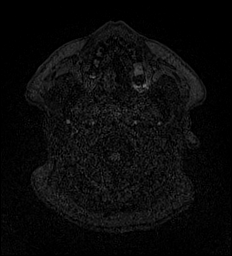
[im 20/176]
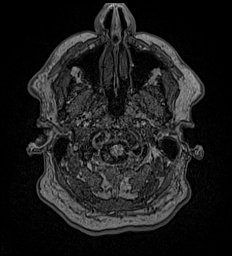
[im 39/176]
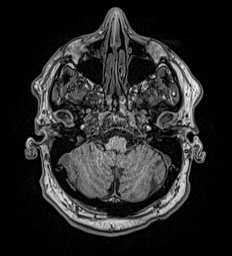
[im 59/176]
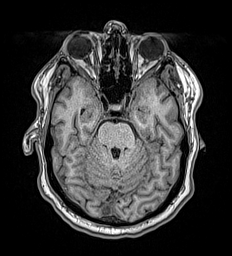
[im 78/176]
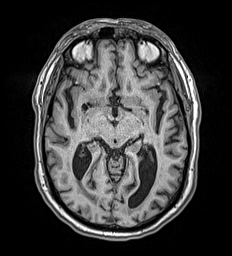
[im 98/176]
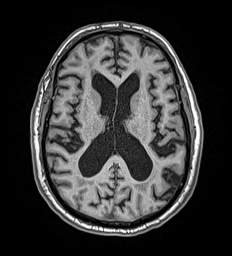
[im 117/176]
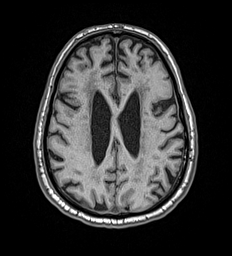
[im 137/176]
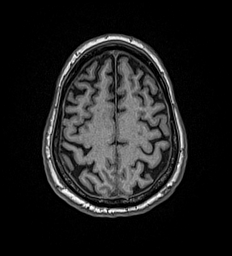
[im 156/176]
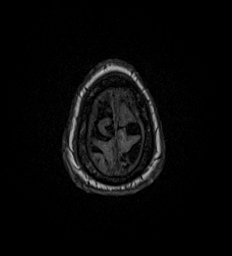
[im 176/176]
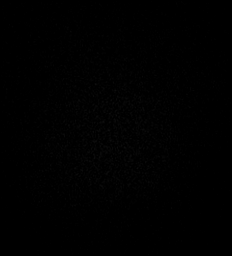

[Series 17: T2 post-contrast · coronal · 5.0mm · 0.57mm/px · 2 of 30 slices shown]
[im 1/30]
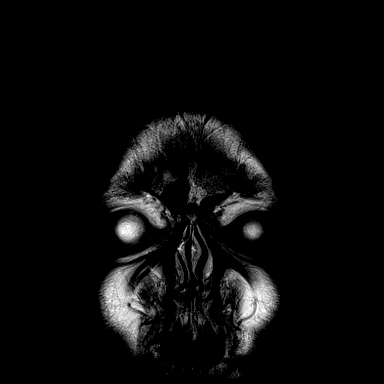
[im 30/30]
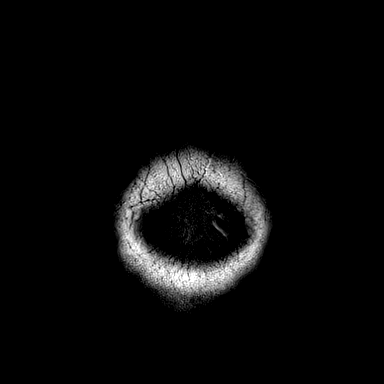

[Series 18: T1 post-contrast · axial · 1.0mm · 0.98mm/px · z∈[-76,+98]mm · 10 of 176 slices shown (1 of 2)]
[im 1/176]
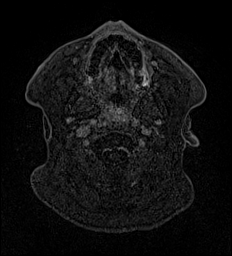
[im 20/176]
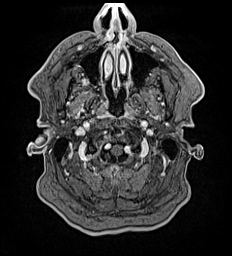
[im 39/176]
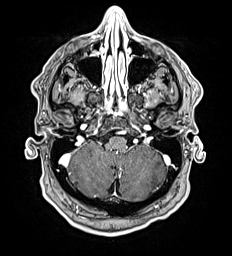
[im 59/176]
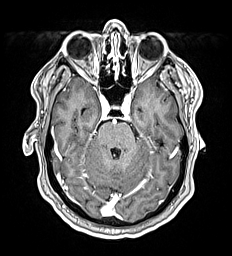
[im 78/176]
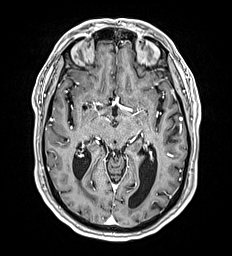
[im 98/176]
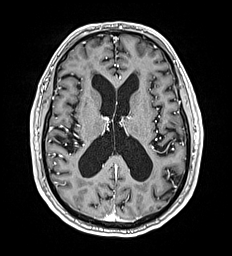
[im 117/176]
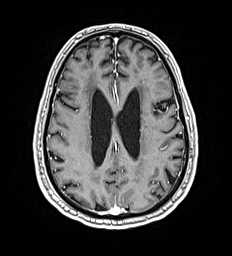
[im 137/176]
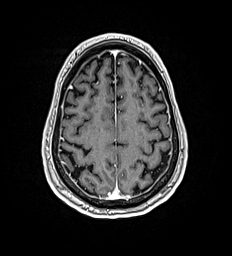
[im 156/176]
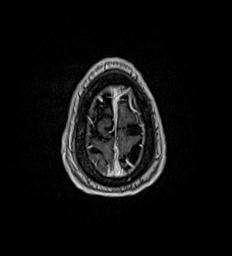
[im 176/176]
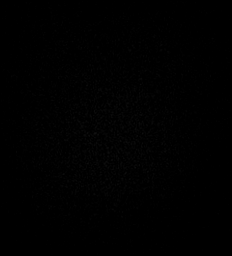

[Series 19: T1 post-contrast · coronal · 5.0mm · 0.57mm/px · 2 of 30 slices shown (2 of 2)]
[im 1/30]
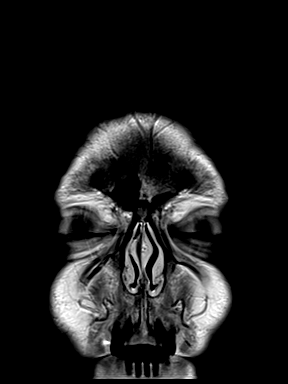
[im 30/30]
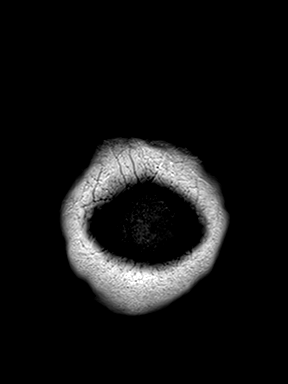

[48 of 48 positions shown; findings below may reference images not displayed]

FINDINGS: Brain: Mild atrophy unchanged from the prior CT. Moderate white
matter changes with periventricular deep white matter
hyperintensities bilaterally. Brainstem intact. 1 cm cyst right
lower basal ganglia unchanged.

Negative for acute infarct, hemorrhage, mass.  Normal enhancement.

Vascular: Normal arterial flow voids at the skull base.

Skull and upper cervical spine: No focal skeletal lesion.

Sinuses/Orbits: Paranasal sinuses clear. Bilateral cataract
extraction

Other: None
IMPRESSION: Mild atrophy and moderate chronic microvascular ischemic change in
the white matter. No acute abnormality no change from [WG] CT.

## 2021-10-03 MED ORDER — GADOBUTROL 1 MMOL/ML IV SOLN
10.0000 mL | Freq: Once | INTRAVENOUS | Status: AC | PRN
  Administered 2021-10-03: 10 mL via INTRAVENOUS

## 2022-03-23 ENCOUNTER — Ambulatory Visit: Payer: Medicare HMO | Admitting: Urology

## 2022-03-29 ENCOUNTER — Encounter: Payer: Self-pay | Admitting: Urology

## 2022-03-29 ENCOUNTER — Ambulatory Visit: Payer: Medicare HMO | Admitting: Urology

## 2022-03-29 VITALS — BP 150/70 | HR 69 | Ht 69.0 in | Wt 290.0 lb

## 2022-03-29 DIAGNOSIS — N3281 Overactive bladder: Secondary | ICD-10-CM | POA: Diagnosis not present

## 2022-03-29 NOTE — Patient Instructions (Signed)

## 2022-03-29 NOTE — Progress Notes (Signed)
   03/29/2022 9:39 AM   Neil Bailey 08/03/1950 [id number]  Reason for visit: Follow up right hydrocele, scrotal swelling, new OAB symptoms  HPI: Comorbid 71 year old male who underwent a right-sided hydrocelectomy with me in January 2022.  This was very challenging with a thick and fibrotic rind.  He has healed very well since that time and really denies any problems with scrotal swelling or thickening.  He had a scrotal ultrasound in October 2022 for follow-up that showed a small left varicocele and small left hydrocele, but no other abnormalities.  On exam today, testicles descended bilaterally without masses, nontender, no significant scrotal swelling.  He also reports at least a few months of some urinary urgency and frequency, primarily during the day.  This correlates with change in his blood pressure medications, with addition of Lasix and hydrochlorothiazide.  He also drinks primarily tea during the day.  Urinalysis was benign in November 2022, and PSA has been within the normal range.  PSA screening no longer recommended per guidelines for age.  We focused on behavioral strategies regarding his urinary symptoms including avoiding bladder irritants, timing of diuretics, timed voiding.  Very hesitant to consider an intake cholinergic OAB medication with his new diagnosis of Parkinson's disease.  Could consider Myrbetriq in the future if worsening symptoms.  He will call to let us know if he is interested in starting Myrbetriq sooner despite trying behavioral strategies.  Consider Myrbetriq in the future if worsening urinary symptoms RTC 1 year PVR, symptom check   [name], MD  [facility] [address], [place], [place] [address] [phone]

## 2022-07-12 ENCOUNTER — Ambulatory Visit: Payer: Medicare HMO | Admitting: Occupational Therapy

## 2022-07-12 ENCOUNTER — Ambulatory Visit: Payer: Medicare HMO | Attending: Neurology | Admitting: Speech Pathology

## 2022-07-12 DIAGNOSIS — M6281 Muscle weakness (generalized): Secondary | ICD-10-CM | POA: Insufficient documentation

## 2022-07-12 DIAGNOSIS — R41841 Cognitive communication deficit: Secondary | ICD-10-CM | POA: Insufficient documentation

## 2022-07-12 DIAGNOSIS — R471 Dysarthria and anarthria: Secondary | ICD-10-CM | POA: Insufficient documentation

## 2022-07-12 DIAGNOSIS — R278 Other lack of coordination: Secondary | ICD-10-CM | POA: Insufficient documentation

## 2022-07-13 ENCOUNTER — Encounter: Payer: Self-pay | Admitting: Speech Pathology

## 2022-07-13 ENCOUNTER — Encounter: Payer: Medicare HMO | Admitting: Occupational Therapy

## 2022-07-13 ENCOUNTER — Encounter: Payer: Medicare HMO | Admitting: Speech Pathology

## 2022-07-13 ENCOUNTER — Ambulatory Visit: Payer: Medicare HMO | Admitting: Speech Pathology

## 2022-07-13 DIAGNOSIS — R278 Other lack of coordination: Secondary | ICD-10-CM | POA: Diagnosis present

## 2022-07-13 DIAGNOSIS — R471 Dysarthria and anarthria: Secondary | ICD-10-CM | POA: Diagnosis present

## 2022-07-13 DIAGNOSIS — R41841 Cognitive communication deficit: Secondary | ICD-10-CM | POA: Diagnosis present

## 2022-07-13 DIAGNOSIS — M6281 Muscle weakness (generalized): Secondary | ICD-10-CM | POA: Diagnosis present

## 2022-07-13 NOTE — Therapy (Signed)
OUTPATIENT SPEECH LANGUAGE PATHOLOGY  EVALUATION   Patient Name: Neil Bailey MRN: [id number] DOB:1951-05-23, 72 y.o., male Today's Date: 07/13/2022  PCP: Emily Filbert, MD REFERRING PROVIDER: Jennings Books, MD   End of Session - 07/13/22 1453     Visit Number 1    Number of Visits 17    Date for SLP Re-Evaluation 09/07/22    Authorization Type [employer] HMO/PPO    Progress Note Due on Visit 10    SLP Start Time 1100    SLP Stop Time  1200    SLP Time Calculation (min) 60 min    Activity Tolerance Patient tolerated treatment well             No past medical history on file.  The histories are not reviewed yet. Please review them in the "History" navigator section and refresh this [place]. Patient Active Problem List   Diagnosis Date Noted   SOB (shortness of breath) on exertion 06/23/2020   Pedal edema 06/08/2020   Renal cyst, acquired, right 06/08/2020   Thrombocytopenia ([place]) 02/11/2020   Acute deep vein thrombosis (DVT) of popliteal vein of right lower extremity ([place]) 07/18/2019   History of 2019 novel coronavirus disease (COVID-19) 07/18/2019   Acute respiratory failure with hypoxia ([place]) 07/04/2019   Atrial fibrillation, chronic ([place]) 07/04/2019   Pneumonia due to COVID-19 virus 07/01/2019   CAP (community acquired pneumonia) 06/30/2019   Medicare annual wellness visit, initial 03/20/2017   Morbid obesity with BMI of 40.0-44.9, adult ([place]) 12/29/2016   History of DVT (deep vein thrombosis) 10/17/2013    ONSET DATE: 05/05/2022  REFERRING DIAG: G20.A1 (ICD-10-CM) - Parkinson's disease   THERAPY DIAG:  Cognitive communication deficit  Dysarthria  Rationale for Evaluation and Treatment Rehabilitation  SUBJECTIVE:   SUBJECTIVE STATEMENT: Pt pleasant, conversant, motivated "I am not sure why I am here, I need help with memory"  Pt accompanied by: self  PERTINENT HISTORY: Pt is a 72 year old male who presents for a cognitive communication  evaluation per request of his neurologist (Dr Jennings Books) for concern with cognitive impairment and dysarthria d/t concern for Parkinson's Diease. Per neurology note, pt has "bilateral postural and active hand tremors (right > left), some right upper extremity rest tremor, slight jaw tremor, reduced stride length, reduced arm swing, bradykinesia or right upper extremity, minimal cogwheeling of the right upper extremity. Currently taking Carbidopa/levodopa 25/'100mg'$ ."  DIAGNOSTIC FINDINGS:   04/2022 MRI  mild atrophy and moderate microvascular ischemic and metabolic changes   62/13/0865 SLUMS 24/30  PAIN:  Are you having pain? No   FALLS: Has patient fallen in last 6 months?  No  LIVING ENVIRONMENT: Lives with: lives with their family and lives with their spouse Lives in: House/apartment  PLOF:  Level of assistance: Independent with ADLs, Independent with IADLs Employment: Retired   PATIENT GOALS improve his memory  OBJECTIVE:   COGNITIVE COMMUNICATION Overall cognitive status: No family/caregiver present to determine baseline cognitive functioning Areas of impairment:  Memory: Impaired: Working Agricultural engineer comprehension: WFL Verbal expression: WFL Functional communication: WFL Functional deficits: needs further assessment  AUDITORY COMPREHENSION: Overall auditory comprehension: Appears intact YES/NO questions: Appears intact Following directions: Appears intact Conversation: Simple Interfering components:  N/A Effective technique: repetition/stressing words and slowed speech   READING COMPREHENSION: Intact  EXPRESSION: verbal  VERBAL EXPRESSION: Level of generative/spontaneous verbalization: conversation Automatic speech: name: intact and social response: intact  Repetition: Appears intact Naming: Responsive: 76-100%, Confrontation: 76-100%, Convergent: 76-100%, and Divergent: 76-100% Pragmatics: Appears intact  Comments: N/A Interfering components:   N/A Effective technique:  N/A Non-verbal means of communication: N/A   WRITTEN EXPRESSION: Dominant hand: right  Written expression: Not tested  MOTOR SPEECH: Overall motor speech: impaired Level of impairment: Sentence Respiration: clavicular breathing Phonation: low vocal intensity Resonance: WFL Articulation: Impaired: sentence Intelligibility: Intelligibility reduced Motor planning: Impaired: aware and consistent Motor speech errors: aware and consistent Interfering components:  N/A Effective technique: slow rate and increased vocal intensity  ORAL MOTOR EXAMINATION Facial : WFL Lingual: WFL Velum: WFL Mandible: WFL Cough: WFL Voice: Strained, Other: low pitch, gravely    OBJECTIVE VOICE ASSESSMENT: Sustained "ah" maximum phonation time: 9 seconds Sustained "ah" loudness average: 76 dB Average fundamental frequency during sustained "ah":157 Hz   (WNL range -  average of  145 Hz +/- 23 for gender)  Oral reading (passage) loudness average: 73 dB Conversational pitch average: 211 Hz Conversational loudness average: 72 dB Voice quality: harsh, rough, strained, and low vocal intensity    PATIENT REPORTED OUTCOME MEASURES (PROM):  MULTIFACTORIAL MEMORY QUESTIONNAIRE (MMQ)  Administered patient self-reported outcome measure Multifactorial Memory Questionnaire (MMQ). The Multifactorial Memory Questionnaire [facility]) consists of three scales measuring separate aspects of metamemory; Satisfaction, Ability and Strategy.   Pt's responses are converted to T-Scores with severity levels based on pt's T-Score.   Severity Levels (T-score) Very Low - < 20 Low - 20 to 29 Below Average - 30-39 Average - 40 to 60 Above Average - 60 to 70 High - 71 to 80 Very High - > 80  Pt reports:  Below Average - 30-39 Memory Satisfaction (T-score: 37) Below Average - 30-39 Memory Ability (T-score: 39) Average - 40 to 60 use of Memory Strategies (T-score: 48)    VOICE HANDICAP INDEX  (VHI)  The Voice Handicap Index is comprised of a series of questions to assess the patient's perception of their voice. It is designed to evaluate the emotional, physical and functional components of the voice problem.  Functional: 14 Physical: 14 Emotional: 7 Total: 35 (Normal mean 8.75, SD =14.97)  z score =  1.75  mild = 1.01-1.99, moderate = 2.00-2.99, severe = 3.00+    TODAY'S TREATMENT:  N/A   PATIENT EDUCATION: Education details: results of this evaluation, ST POC Person educated: Patient Education method: Explanation Education comprehension: verbalized understanding     GOALS: Goals reviewed with patient? Yes  SHORT TERM GOALS: Target date: 10 sessions  The patient will maximize voice quality and loudness using breath support for sustained vowel production, pitch glides, and hierarchal speech drill. Baseline: Goal status: INITIAL  2.   The patient will demonstrate abdominal breathing patterns and steady release of breath on exhalation to optimize efficiency of voicing and decrease laryngeal hyperfunction Baseline:  Goal status: INITIAL  3.  Patient will report improved communication effectiveness as measured by Communicative Effectiveness Survey.  Baseline:  Goal status: INITIAL  4.  Pt will use external aid for functional recall of completed/upcoming activities 75% accuracy with Min A.  Baseline:  Goal status: INITIAL  5.  Pt will use strategies to improve memory for important information with 75% acc. with minimal assistance (ie., white board, daily planner/calendar, Apps on phone).  Baseline:  Goal status: INITIAL  6.  Pt will complete mod complex money/financial problems 100% accuracy in a reasonable amount of time with double checking, use of strategies and rare min A. Baseline:  Goal status: INITIAL  LONG TERM GOALS: Target date: 09/07/2022 Pt will complete mod complex money/financial problems 100% accuracy  in a reasonable amount of time with  double checking, use of strategies Baseline:  Goal status: INITIAL  2.  Pt will use strategies to improve memory for important information with 75% acc. (ie., white board, daily planner/calendar, Apps on phone).  Baseline:  Goal status: INITIAL  3.  Pt will use external aid for functional recall of completed/upcoming activities > 90% accuracy Baseline:  Goal status: INITIAL  4.  Pt will read paragraph length material with rare Min A cues for use of loud, good quality voice.  Baseline:  Goal status: INITIAL   ASSESSMENT:  CLINICAL IMPRESSION: Patient is a 72  y.o. male who was seen today for cognitive communication evaluation d/t concerns for dysarthria and cognitive difficulty related to Parkinson's Disease. 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 Pt would benefit from therapy to target improve respiratory support for phonation. Pt also presents with mild to moderate memory deficits that affect his daily living. He would benefit from further assessment of memory and cognitive function.   OBJECTIVE IMPAIRMENTS include memory and dysarthria. These impairments are limiting patient from managing medications, managing appointments, managing finances, household responsibilities, and effectively communicating at home and in community. Factors affecting potential to achieve goals and functional outcome are ability to learn/carryover information and co-morbidities.. Patient will benefit from skilled SLP services to address above impairments and improve overall function.  REHAB POTENTIAL: Excellent  PLAN: SLP FREQUENCY: 1-2x/week  SLP DURATION: 8 weeks  PLANNED INTERVENTIONS: Internal/external aids, Functional tasks, SLP instruction and feedback, Compensatory strategies, and Patient/family education   Emiliya Chretien B. Rutherford Nail, M.S., CCC-SLP, Mining engineer Certified Brain Injury  [place]  [place] Office [phone] Ascom [phone] Fax [phone]

## 2022-07-14 ENCOUNTER — Encounter: Payer: Medicare HMO | Admitting: Speech Pathology

## 2022-07-14 ENCOUNTER — Encounter: Payer: Medicare HMO | Admitting: Occupational Therapy

## 2022-07-14 ENCOUNTER — Ambulatory Visit: Payer: Medicare HMO | Admitting: Occupational Therapy

## 2022-07-14 DIAGNOSIS — R41841 Cognitive communication deficit: Secondary | ICD-10-CM | POA: Diagnosis not present

## 2022-07-14 DIAGNOSIS — M6281 Muscle weakness (generalized): Secondary | ICD-10-CM

## 2022-07-14 DIAGNOSIS — R278 Other lack of coordination: Secondary | ICD-10-CM

## 2022-07-14 NOTE — Therapy (Signed)
OUTPATIENT OCCUPATIONAL THERAPY NEURO EVALUATION  Patient Name: Neil Bailey MRN: [id number] DOB:10-17-1950, 72 y.o., male Today's Date: 07/14/2022  PCP: Emily Filbert, MD REFERRING PROVIDER: Jennings Books, MD  END OF SESSION:  OT End of Session - 07/14/22 1322     Visit Number 1    Number of Visits 17    Date for OT Re-Evaluation 08/25/22    OT Start Time 0830    OT Stop Time 0930    OT Time Calculation (min) 60 min    Activity Tolerance Patient tolerated treatment well    Behavior During Therapy [facility] for tasks assessed/performed             Past Medical History:  Diagnosis Date   Aortic atherosclerosis ([place]) 03/09/2020   Noted on abdominal CT   Atrial fibrillation ([place])    COVID-19 06/2019   DVT (deep venous thrombosis) ([facility])    Dyspnea    since covid dec 2020   Dysrhythmia    paroxysmal atrial fibrillation   HTN (hypertension)    Lab test positive for detection of COVID-19 virus 06/30/2019   Morbid obesity ([place])    Pedal edema    Renal cyst, acquired, right    Thrombocytopenia ([place])    Past Surgical History:  Procedure Laterality Date   COLONOSCOPY  7-8 years ago   [facility]   COLONOSCOPY WITH PROPOFOL N/A 08/26/2020   Procedure: COLONOSCOPY WITH PROPOFOL;  Surgeon: Toledo, Benay Pike, MD;  Location: [facility] ENDOSCOPY;  Service: Gastroenterology;  Laterality: N/A;   CYSTOSCOPY Left 04/03/2020   Procedure: CYSTOSCOPY Birdena Crandall;  Surgeon: [name], MD;  Location: [facility] ORS;  Service: Urology;  Laterality: Left;   EYE SURGERY  05/2014, 06/2014   cataracts   HERNIA REPAIR     HYDROCELE EXCISION Right 08/07/2020   Procedure: HYDROCELECTOMY ADULT;  Surgeon: [name], MD;  Location: [facility] ORS;  Service: Urology;  Laterality: Right;   INGUINAL HERNIA REPAIR Right 01/19/2015   Procedure: RIGHT INCARCERATED INGUINAL HERNIA REPAIR;  Surgeon: Christene Lye, MD;  Location: [facility] ORS;  Service: General;  Laterality: Right;   TONSILLECTOMY     XI ROBOTIC  ASSISTED INGUINAL HERNIA REPAIR WITH MESH Left 04/03/2020   Procedure: XI ROBOTIC ASSISTED INGUINAL HERNIA REPAIR WITH MESH, possible bilateral;  Surgeon: Jules Husbands, MD;  Location: [facility] ORS;  Service: General;  Laterality: Left;   Patient Active Problem List   Diagnosis Date Noted   SOB (shortness of breath) on exertion 06/23/2020   Pedal edema 06/08/2020   Renal cyst, acquired, right 06/08/2020   Thrombocytopenia ([place]) 02/11/2020   Acute deep vein thrombosis (DVT) of popliteal vein of right lower extremity ([place]) 07/18/2019   History of 2019 novel coronavirus disease (COVID-19) 07/18/2019   Acute respiratory failure with hypoxia ([place]) 07/04/2019   Atrial fibrillation, chronic ([place]) 07/04/2019   Pneumonia due to COVID-19 virus 07/01/2019   CAP (community acquired pneumonia) 06/30/2019   Medicare annual wellness visit, initial 03/20/2017   Morbid obesity with BMI of 40.0-44.9, adult ([place]) 12/29/2016   History of DVT (deep vein thrombosis) 10/17/2013    ONSET DATE: 12/09/2021  REFERRING DIAG: Parkinson's Disease  THERAPY DIAG:  Muscle weakness (generalized)  Other lack of coordination  Rationale for Evaluation and Treatment: Rehabilitation  SUBJECTIVE:   SUBJECTIVE STATEMENT: Pt. Reports not knowing much about LSVT BIG, but hopes it will help his balance  Pt accompanied by: self  PERTINENT HISTORY:  Pt. is a 72 y.o. male who was diagnosed with Parkinson's  Disease in June of 2023.   PRECAUTIONS: None  WEIGHT BEARING RESTRICTIONS: No  PAIN:  Are you having pain? No  FALLS: Has patient fallen in last 6 months? No  LIVING ENVIRONMENT: Lives with: lives with their spouse Lives in: House/apartment Stairs: 3- 4 steps to enter. 2 story home Has following equipment at home: None  PLOF: Independent  PATIENT GOALS: Regain stability  OBJECTIVE:   HAND DOMINANCE: Ambidextrous  ADLs: Overall ADLs:   Transfers/ambulation related to ADLs: Eating:  Tremors, uses a  spoon instead of a fork Grooming: Difficulty aligning  toothpaste on the tooth brush, difficulty cutting toenails-Consistently goes to a nail salon to have them done.  UB Dressing:  Independent LB Dressing: Independent, increased time to complete Toileting: Independent Bathing: Independent Tub Shower transfers: Independent    IADLs: Shopping: Independent Light housekeeping: No changes noted  Meal Prep: Does not cook, grills Community mobility:  Driving Independently Medication management: Independent with weekly pillbox Financial management: No changes noted Handwriting: 50% legible Name in cursive writing  MOBILITY STATUS: Independent  POSTURE COMMENTS:   Sitting balance:  Good unsupported sitting balance  ACTIVITY TOLERANCE: Activity tolerance: Pt. Requires rest breaks during standing assessments  FUNCTIONAL OUTCOME MEASURES:  FOTO: 48 5x's STS: 18 sec. TUG: 11 sec. Freezing of Gait: 9 BERG Balance Scale: 43/56   UPPER EXTREMITY ROM:    Active ROM Right Eval WFL  Left Eval [facility]  Shoulder flexion    Shoulder abduction    Shoulder adduction    Shoulder extension    Shoulder internal rotation    Shoulder external rotation    Elbow flexion    Elbow extension    Wrist flexion    Wrist extension    Wrist ulnar deviation    Wrist radial deviation    Wrist pronation    Wrist supination    (Blank rows = not tested)  UPPER EXTREMITY MMT:     MMT Right Eval 5/5 Left Eval 5/5  Shoulder flexion    Shoulder abduction    Shoulder adduction    Shoulder extension    Shoulder internal rotation    Shoulder external rotation    Middle trapezius    Lower trapezius    Elbow flexion    Elbow extension    Wrist flexion    Wrist extension    Wrist ulnar deviation    Wrist radial deviation    Wrist pronation    Wrist supination    (Blank rows = not tested)  HAND FUNCTION: Grip strength: Right: 68 lbs; Left: 64 lbs and Lateral pinch: Right: 18 lbs, Left: 18  lbs 3pt. Pinch Right: 18 lbs, Left: 17 lbs  COORDINATION: 9 Hole Peg test: Right: 33 sec; Left: 27 sec  SENSATION: WFL  EDEMA: N/A  MUSCLE TONE:  WFL  COGNITION: Overall cognitive status: Pt. reports cognitive/memory changes. Pt. Being seen by SLP  VISION: Subjective report:  Pt. reports no changes with vision.  VISION ASSESSMENT:  WFL for tasks assessed  PERCEPTION:  TBA  PRAXIS: Impaired: Motor planning    TODAY'S TREATMENT:  DATE: 07/14/2022   PATIENT EDUCATION: Education details: LSVT program for the Treatment of Parkinson's Disease, OT Services, POC, goals.  Person educated: Patient Education method: Explanation, Demonstration, Tactile cues, and Verbal cues Education comprehension: verbalized understanding  HOME EXERCISE PROGRAM:  To be determined    GOALS: Goals reviewed with patient? Yes  SHORT TERM GOALS: Target date: 08/04/2022    Pt. Will be independent with HEPs for Maximal Daily Exercises, including modifications. Baseline: Eval: No current HEP. Training to begin for the MDE on the next visit. Goal status: INITIAL    LONG TERM GOALS: Target date: 08/25/2022    Pt. Will be independent with the Established LSVT  Functional component, and Hierarchy tasks.  Baseline: Eval: Functional component tasks, and hierarchy tasks to be established Goal status: INITIAL  2.  Pt. Will improve the BERG Balance score by 5 points to reduce fall risk during daily tasks. Baseline: Eval: BERG Balance score: 43/56 Goal status: INITIAL  3.  Pt. Will improve the Freezing of Gait score by 3 points.  Baseline: Eval: FOG score: 9 Goal status: INITIAL  4.  Pt. Increase the FOTO score by 3 points for Pt. perceived improvement with assessment specific ADLs, and IADL tasks.  Baseline: Eval: FOTO score: 48 Goal status: INITIAL  5.  Pt. Will write  his name with 75% legibility in preparation for signing documents.  Baseline: Eval: Pt. signed his name with 50% legibility. Goal status: INITIAL    ASSESSMENT:  CLINICAL IMPRESSION:  Patient is a 72 y.o. male who was seen today for occupational therapy evaluation for LSVT BIG for Parkinson's Disease. Pt. presents with tremors including resting tremors in the bilateral UEs with the right hand greater than the left affecting utensils uses, the application of toothpaste on the toothbrush, and writing legibility. Pt. Presents with shuffling gait with freezing of Gait score: 9, 5x's sit to stand: 18 sec., TUG: 11 sec., and BERG Balance Scale: 43/56 which indicates a low fall risk nearing a medium fall risk. Pt. required multiple rest breaks during standing, and balance assessments. Pt. presents with positive freezing and shuffling of gait.  Pt. Will benefit from OT services for the LSVT BIG program to specifically focus on increasing the amplitude of movements for improved UE functioning, balance, and safety during ADLs, and IADL tasks while reducing overall fall risk.     PERFORMANCE DEFICITS: in functional skills including ADLs, IADLs, coordination, ROM, strength, Fine motor control, and Gross motor control, cognitive skills including memory and problem solving, and psychosocial skills including coping strategies, environmental adaptation, habits, interpersonal interactions, and routines and behaviors.   IMPAIRMENTS: are limiting patient from ADLs, IADLs, and leisure.   CO-MORBIDITIES: may have co-morbidities  that affects occupational performance. Patient will benefit from skilled OT to address above impairments and improve overall function.  MODIFICATION OR ASSISTANCE TO COMPLETE EVALUATION: Min-Moderate modification of tasks or assist with assess necessary to complete an evaluation.  OT OCCUPATIONAL PROFILE AND HISTORY: Detailed assessment: Review of records and additional review of physical,  cognitive, psychosocial history related to current functional performance.  CLINICAL DECISION MAKING: Moderate - several treatment options, min-mod task modification necessary  REHAB POTENTIAL: Good  EVALUATION COMPLEXITY: Moderate    PLAN:  OT FREQUENCY: 4x/week  OT DURATION: 6 weeks  PLANNED INTERVENTIONS: self care/ADL training, therapeutic exercise, therapeutic activity, neuromuscular re-education, manual therapy, passive range of motion, functional mobility training, moist heat, patient/family education, cognitive remediation/compensation, and DME and/or AE instructions  RECOMMENDED OTHER SERVICES: SLP  CONSULTED AND  AGREED WITH PLAN OF CARE: Patient and family member/caregiver  PLAN FOR NEXT SESSION: Initiate treatment, perform the 6 min. walk test.  Harrel Carina, MS, OTR/L  Harrel Carina, OT 07/14/2022, 1:24 PM

## 2022-07-15 ENCOUNTER — Encounter: Payer: Medicare HMO | Admitting: Speech Pathology

## 2022-07-15 ENCOUNTER — Encounter: Payer: Medicare HMO | Admitting: Occupational Therapy

## 2022-07-18 ENCOUNTER — Ambulatory Visit: Payer: Medicare HMO | Admitting: Occupational Therapy

## 2022-07-18 ENCOUNTER — Ambulatory Visit: Payer: Medicare HMO | Admitting: Speech Pathology

## 2022-07-18 DIAGNOSIS — M6281 Muscle weakness (generalized): Secondary | ICD-10-CM

## 2022-07-18 DIAGNOSIS — R41841 Cognitive communication deficit: Secondary | ICD-10-CM | POA: Diagnosis not present

## 2022-07-18 NOTE — Therapy (Unsigned)
OUTPATIENT SPEECH LANGUAGE PATHOLOGY TREATMENT NOTE   Patient Name: Neil Bailey MRN: [id number] DOB:[birth year], 72 y.o., male Today's Date: 07/18/2022  PCP: Emily Filbert, MD REFERRING PROVIDER: Jennings Books, MD  END OF SESSION:   End of Session - 07/18/22 0905     Visit Number 2    Number of Visits 17    Date for SLP Re-Evaluation 09/07/22    Authorization Type [employer] HMO/PPO    Progress Note Due on Visit 10    SLP Start Time 0900    SLP Stop Time  1000    SLP Time Calculation (min) 60 min    Activity Tolerance Patient tolerated treatment well             Past Medical History:  Diagnosis Date   Aortic atherosclerosis ([place]) 03/09/2020   Noted on abdominal CT   Atrial fibrillation ([place])    COVID-19 06/2019   DVT (deep venous thrombosis) (HCC)    Dyspnea    since covid dec 2020   Dysrhythmia    paroxysmal atrial fibrillation   HTN (hypertension)    Lab test positive for detection of COVID-19 virus 06/30/2019   Morbid obesity ([place])    Pedal edema    Renal cyst, acquired, right    Thrombocytopenia ([place])    Past Surgical History:  Procedure Laterality Date   COLONOSCOPY  7-8 years ago   [facility]   COLONOSCOPY WITH PROPOFOL N/A 08/26/2020   Procedure: COLONOSCOPY WITH PROPOFOL;  Surgeon: Toledo, Benay Pike, MD;  Location: [facility] ENDOSCOPY;  Service: Gastroenterology;  Laterality: N/A;   CYSTOSCOPY Left 04/03/2020   Procedure: CYSTOSCOPY Birdena Crandall;  Surgeon: [name], MD;  Location: [facility] ORS;  Service: Urology;  Laterality: Left;   EYE SURGERY  05/2014, 06/2014   cataracts   HERNIA REPAIR     HYDROCELE EXCISION Right 08/07/2020   Procedure: HYDROCELECTOMY ADULT;  Surgeon: [name], MD;  Location: [facility] ORS;  Service: Urology;  Laterality: Right;   INGUINAL HERNIA REPAIR Right 01/19/2015   Procedure: RIGHT INCARCERATED INGUINAL HERNIA REPAIR;  Surgeon: Christene Lye, MD;  Location: [facility] ORS;  Service: General;  Laterality: Right;    TONSILLECTOMY     XI ROBOTIC ASSISTED INGUINAL HERNIA REPAIR WITH MESH Left 04/03/2020   Procedure: XI ROBOTIC ASSISTED INGUINAL HERNIA REPAIR WITH MESH, possible bilateral;  Surgeon: Jules Husbands, MD;  Location: [facility] ORS;  Service: General;  Laterality: Left;   Patient Active Problem List   Diagnosis Date Noted   SOB (shortness of breath) on exertion 06/23/2020   Pedal edema 06/08/2020   Renal cyst, acquired, right 06/08/2020   Thrombocytopenia ([place]) 02/11/2020   Acute deep vein thrombosis (DVT) of popliteal vein of right lower extremity ([place]) 07/18/2019   History of 2019 novel coronavirus disease (COVID-19) 07/18/2019   Acute respiratory failure with hypoxia ([place]) 07/04/2019   Atrial fibrillation, chronic ([place]) 07/04/2019   Pneumonia due to COVID-19 virus 07/01/2019   CAP (community acquired pneumonia) 06/30/2019   Medicare annual wellness visit, initial 03/20/2017   Morbid obesity with BMI of 40.0-44.9, adult ([place]) 12/29/2016   History of DVT (deep vein thrombosis) 10/17/2013    ONSET DATE: 05/05/2022  REFERRING DIAG: G20.A1 (ICD-10-CM) - Parkinson's disease    THERAPY DIAG:  Cognitive communication deficit   Dysarthria  THERAPY DIAG:  Cognitive communication deficit  Rationale for Evaluation and Treatment Rehabilitation  SUBJECTIVE: pt pleasant, states that he is "very laid back" about things  Pt accompanied by: self  PAIN:  Are you having pain? No  PATIENT GOALS: improve his memory  OBJECTIVE:   TODAY'S TREATMENT:  Skilled treatment session focused on pt's cognitive communication goals. SLP facilitated the session by providing the following interventions:  During patient centered interviewing, it was observed that pt is literal in his interpretation of information. For example, when asked how often he takes his medicines, he stated "every day" rather than 2xday etc.   Pt with poor recall of medicines and will bring in his pill bottle for medication  reconciliation during next session.   SLP further facilitated session by providing instruction on using external aids to recall his appts. Currently pt has 3 different methods that result in inefficient recall of appts.  This is also evidenced by him missing his first evaluation with this Probation officer as well as needed to call to verify times of today's appt. SLP instructed pt to consolidate all appt into one calendar. Pt chose his main calendar at home.    To further assess pt's global cognitive abilities the ACE III was administered.   The [place]oke's Cognitive Examination-III (ACE-III) is a brief cognitive test that assesses five cognitive domains. The total score is 100 with higher scores indicating better cognitive functioning. Cut off scores of 88 and 82 are recommended for suspicion of dementia (88 has sensitivity of 1.00 and specificity of 0.96, 82 has sensitivity of 0.93 and specificity of 1.00). American Version C  Attention 18/18  Memory 19/26  Fluency 11/14  Language 26/26  Visuospatial 13/16  TOTAL ACE- III Score 87/100    Education provided on the results and plan for next session. Pt voiced agreement.    PATIENT EDUCATION: Education details: see above Person educated: Patient Education method: Education officer, environmental, and Verbal cues Education comprehension: verbalized understanding  HOME EXERCISE PROGRAM:  Put all appts on one calendar, take picture of calendar and bring in his medicines  GOALS: Goals reviewed with patient? Yes  SHORT TERM GOALS: Target date: 10 sessions   The patient will maximize voice quality and loudness using breath support for sustained vowel production, pitch glides, and hierarchal speech drill. Baseline: Goal status: INITIAL   2.   The patient will demonstrate abdominal breathing patterns and steady release of breath on exhalation to optimize efficiency of voicing and decrease laryngeal hyperfunction Baseline:  Goal status: INITIAL    3.  Patient will report improved communication effectiveness as measured by Communicative Effectiveness Survey.  Baseline:  Goal status: INITIAL   4.  Pt will use external aid for functional recall of completed/upcoming activities 75% accuracy with Min A.  Baseline:  Goal status: INITIAL   5.  Pt will use strategies to improve memory for important information with 75% acc. with minimal assistance (ie., white board, daily planner/calendar, Apps on phone).  Baseline:  Goal status: INITIAL   6.  Pt will complete mod complex money/financial problems 100% accuracy in a reasonable amount of time with double checking, use of strategies and rare min A. Baseline:  Goal status: INITIAL   LONG TERM GOALS: Target date: 09/07/2022 Pt will complete mod complex money/financial problems 100% accuracy in a reasonable amount of time with double checking, use of strategies Baseline:  Goal status: INITIAL   2.  Pt will use strategies to improve memory for important information with 75% acc. (ie., white board, daily planner/calendar, Apps on phone).  Baseline:  Goal status: INITIAL   3.  Pt will use external aid for functional recall of completed/upcoming activities > 90% accuracy Baseline:  Goal status: INITIAL   4.  Pt will read paragraph length material with rare Min A cues for use of loud, good quality voice.  Baseline:  Goal status: INITIAL    ASSESSMENT:  CLINICAL IMPRESSION: Pt presents with what he describes as a "laid back attitude" what results in general disorganization. Will provide instruction in cognitive strategies but pt will need to choose to execute the strategies within his daily life.   OBJECTIVE IMPAIRMENTS include memory and dysarthria. These impairments are limiting patient from managing medications, managing appointments, managing finances, household responsibilities, ADLs/IADLs, and effectively communicating at home and in community. Factors affecting potential to  achieve goals and functional outcome are ability to learn/carryover information, co-morbidities, and previous level of function. Patient will benefit from skilled SLP services to address above impairments and improve overall function.  REHAB POTENTIAL:   PLAN: SLP FREQUENCY: 2x/week  SLP DURATION: 8 weeks  PLANNED INTERVENTIONS: Environmental controls, Cognitive reorganization, Internal/external aids, Functional tasks, SLP instruction and feedback, Compensatory strategies, and Patient/family education  Biana Haggar B. Rutherford Nail, M.S., CCC-SLP, Mining engineer Certified [facility] [place]  [place] Office [phone] Ascom [phone] Fax [phone]

## 2022-07-18 NOTE — Therapy (Addendum)
OUTPATIENT OCCUPATIONAL THERAPY NEURO/LSVT TREATMENT  Patient Name: Neil Bailey MRN: [id number] DOB:[birth year], 72 y.o., male Today's Date: 07/18/2022  PCP: Emily Filbert, MD REFERRING PROVIDER: Jennings Books, MD  END OF SESSION:  OT End of Session - 07/18/22 1602     Visit Number 2    Number of Visits 17    Date for OT Re-Evaluation 08/25/22    Authorization Time Period Progress report period starting 07/14/2022    OT Start Time 1000    OT Stop Time 1100    OT Time Calculation (min) 60 min    Activity Tolerance Patient tolerated treatment well    Behavior During Therapy [facility] for tasks assessed/performed             Past Medical History:  Diagnosis Date   Aortic atherosclerosis ([place]) 03/09/2020   Noted on abdominal CT   Atrial fibrillation ([place])    COVID-19 06/2019   DVT (deep venous thrombosis) (HCC)    Dyspnea    since covid dec 2020   Dysrhythmia    paroxysmal atrial fibrillation   HTN (hypertension)    Lab test positive for detection of COVID-19 virus 06/30/2019   Morbid obesity ([place])    Pedal edema    Renal cyst, acquired, right    Thrombocytopenia ([place])    Past Surgical History:  Procedure Laterality Date   COLONOSCOPY  7-8 years ago   [facility]   COLONOSCOPY WITH PROPOFOL N/A 08/26/2020   Procedure: COLONOSCOPY WITH PROPOFOL;  Surgeon: Toledo, Benay Pike, MD;  Location: [facility];  Service: Gastroenterology;  Laterality: N/A;   CYSTOSCOPY Left 04/03/2020   Procedure: CYSTOSCOPY Birdena Crandall;  Surgeon: [name], MD;  Location: [facility] ORS;  Service: Urology;  Laterality: Left;   EYE SURGERY  05/2014, 06/2014   cataracts   HERNIA REPAIR     HYDROCELE EXCISION Right 08/07/2020   Procedure: HYDROCELECTOMY ADULT;  Surgeon: [name], MD;  Location: [facility] ORS;  Service: Urology;  Laterality: Right;   INGUINAL HERNIA REPAIR Right 01/19/2015   Procedure: RIGHT INCARCERATED INGUINAL HERNIA REPAIR;  Surgeon: Christene Lye, MD;  Location: [facility]S;   Service: General;  Laterality: Right;   TONSILLECTOMY     XI ROBOTIC ASSISTED INGUINAL HERNIA REPAIR WITH MESH Left 04/03/2020   Procedure: XI ROBOTIC ASSISTED INGUINAL HERNIA REPAIR WITH MESH, possible bilateral;  Surgeon: Jules Husbands, MD;  Location: [facility] OR[facility];  Service: General;  Laterality: Left;   Patient Active Problem List   Diagnosis Date Noted   SOB (shortness of breath) on exertion 06/23/2020   Pedal edema 06/08/2020   Renal cyst, acquired, right 06/08/2020   Thrombocytopenia ([place]) 02/11/2020   Acute deep vein thrombosis (DVT) of popliteal vein of right lower extremity ([place]) 07/18/2019   History of 2019 novel coronavirus disease (COVID-19) 07/18/2019   Acute respiratory failure with hypoxia ([place]) 07/04/2019   Atrial fibrillation, chronic ([address]) 07/04/2019   Pneumonia due to COVID-19 virus 07/01/2019   CAP (community acquired pneumonia) 06/30/2019   Medicare annual wellness visit, initial 03/20/2017   Morbid obesity with BMI of 40.0-44.9, adult ([place]) 12/29/2016   History of DVT (deep vein thrombosis) 10/17/2013    ONSET DATE: 12/09/2021  REFERRING DIAG: Parkinson's Disease  THERAPY DIAG:  Muscle weakness (generalized)  Rationale for Evaluation and Treatment: Rehabilitation  SUBJECTIVE:   SUBJECTIVE STATEMENT:  Pt. arrived for therapy following ST.  Pt accompanied by: self  PERTINENT HISTORY:  Pt. is a 72 y.o. male who was diagnosed with Parkinson's Disease in  June of 2023.   PRECAUTIONS: None  WEIGHT BEARING RESTRICTIONS: No  PAIN:  Are you having pain? No  FALLS: Has patient fallen in last 6 months? No  LIVING ENVIRONMENT: Lives with: lives with their spouse Lives in: House/apartment Stairs: 3- 4 steps to enter. 2 story home Has following equipment at home: None  PLOF: Independent  PATIENT GOALS: Regain stability  OBJECTIVE:   HAND DOMINANCE: Ambidextrous  ADLs: Overall ADLs:   Transfers/ambulation related to ADLs: Eating:  Tremors, uses a  spoon instead of a fork Grooming: Difficulty aligning  toothpaste on the tooth brush, difficulty cutting toenails-Consistently goes to a nail salon to have them done.  UB Dressing:  Independent LB Dressing: Independent, increased time to complete Toileting: Independent Bathing: Independent Tub Shower transfers: Independent    IADLs: Shopping: Independent Light housekeeping: No changes noted  Meal Prep: Does not cook, grills Community mobility:  Driving Independently Medication management: Independent with weekly pillbox Financial management: No changes noted Handwriting: 50% legible Name in cursive writing  MOBILITY STATUS: Independent  POSTURE COMMENTS:   Sitting balance:  Good unsupported sitting balance  ACTIVITY TOLERANCE: Activity tolerance: Pt. requires rest breaks during standing assessments  FUNCTIONAL OUTCOME MEASURES:  FOTO: 48 5x's STS: 18 sec. TUG: 11 sec. Freezing of Gait: 9 BERG Balance Scale: 43/56  07/18/2022:   6 minute walk test: 890 ft. with a seated rest break required at the end.   UPPER EXTREMITY ROM:    Active ROM Right Eval WFL  Left Eval [facility]  Shoulder flexion    Shoulder abduction    Shoulder adduction    Shoulder extension    Shoulder internal rotation    Shoulder external rotation    Elbow flexion    Elbow extension    Wrist flexion    Wrist extension    Wrist ulnar deviation    Wrist radial deviation    Wrist pronation    Wrist supination    (Blank rows = not tested)  UPPER EXTREMITY MMT:     MMT Right Eval 5/5 Left Eval 5/5  Shoulder flexion    Shoulder abduction    Shoulder adduction    Shoulder extension    Shoulder internal rotation    Shoulder external rotation    Middle trapezius    Lower trapezius    Elbow flexion    Elbow extension    Wrist flexion    Wrist extension    Wrist ulnar deviation    Wrist radial deviation    Wrist pronation    Wrist supination    (Blank rows = not tested)  HAND  FUNCTION: Grip strength: Right: 68 lbs; Left: 64 lbs and Lateral pinch: Right: 18 lbs, Left: 18 lbs 3pt. Pinch Right: 18 lbs, Left: 17 lbs  COORDINATION: 9 Hole Peg test: Right: 33 sec; Left: 27 sec  SENSATION: WFL  EDEMA: N/A  MUSCLE TONE:  WFL  COGNITION: Overall cognitive status: Pt. reports cognitive/memory changes. Pt. being seen by SLP  VISION: Subjective report:  Pt. reports no changes with vision.  VISION ASSESSMENT:  WFL for tasks assessed  PERCEPTION:  TBA  PRAXIS: Impaired: Motor planning    TODAY'S TREATMENT:  DATE: 07/18/2022   LSVT: Patient seen for LSVT Daily Session Maximal Daily Exercises for facilitation/coordination of movement Maximum Sustained Movements are designed to rescale the amplitude of movement output for generalization to daily functional activities. Performed as follows for 1 set of 10 repetitions each multi-directional sustained movements: 1) Floor to ceiling- Pt. Presents with limited forward flexion towards the floor.  2) Side to side multidirectional- Pt. presents with limited turning while shifting the posterior leg back in one movement.  Repetitive movements performed in standing and are designed to provide retraining effort needed for sustained muscle activation in tasks. Performed as follows for 1 set of 10 repetitions each of multi-directional repetitive movements. Pt. required the adapted version with a chair for each of the repetitive movements.   3) Step and reach forward step- Pt. presented with positive LOB consistently when stepping forward with the left LE. Pt. required cues for upright posture. 4) Step and reach sideways step- Pt. required verbal, and tactile cues for hand, and UE position.   5) Step and reach backwards step- Limited arm swing posteriorly noted. Limited step posteriorly.  6) Rock and reach  forward/backward- Pt. required the movement pattern to be graded initially without arms, then progressed  to adding one arm while the opposite arm was stabilized on a chair. Pt. presented with difficulty with the fluidity of motion of lifting toes after cuing.  7) Rock and reach sideways- Pt. presents with limited rotation through the trunk when turning to look over his shoulders.  Functional Component Task: 1.Sit to stand BIG functional component task for 5 reps with visual visual demonstration, visual, and tactile cues. Pt. presents with limited forward flexion in anticipation for pushing through his legs to stand, and limited forward hip flexion when transitioning back to sitting.   BIG ambulation:    6 min. walk test was administered. Pt. was able to walk 890 ft. with a rest break required at the end. Pt. presents with shuffling as the test progressed. Pt. reports that his shoes often scuff along the floor.   Pt. required each of the repetitive MDE's to be performed in the adapted version with a chair. Pt. required consistent verbal cues, tactile cues, visual demonstration, and modeling of each exercise. Pt. required supervision during the repetitive MDE's, however required CGA when attempting to increase amplitude of the LE movements.  Pt. required cues for upright posture when standing and reaching. Pt. requires tactile cues for LE position, UE alignment, and hand position. Pt. required cues to lift each foot when walking.  Pt. continues to benefit from OT services for the LSVT BIG program to specifically focus on increasing the amplitude of movements for improved UE functioning, balance, and safety during ADLs, and IADL tasks while reducing overall fall risk.      PATIENT EDUCATION: Education details: LSVT MDE's Person educated: Patient Education method: Explanation, Demonstration, Tactile cues, and Verbal cues Education comprehension: verbalized understanding  HOME EXERCISE PROGRAM:  To  be determined    GOALS: Goals reviewed with patient? Yes  SHORT TERM GOALS: Target date: 08/04/2022    Pt. Will be independent with HEPs for Maximal Daily Exercises, including modifications. Baseline: Eval: No current HEP. Training to begin for the MDE on the next visit. Goal status: INITIAL    LONG TERM GOALS: Target date: 08/25/2022    Pt. Will be independent with the Established LSVT  Functional component, and Hierarchy tasks.  Baseline: Eval: Functional component tasks, and hierarchy tasks to be established Goal status:  INITIAL  2.  Pt. Will improve the BERG Balance score by 5 points to reduce fall risk during daily tasks. Baseline: Eval: BERG Balance score: 43/56 Goal status: INITIAL  3.  Pt. Will improve the Freezing of Gait score by 3 points.  Baseline: Eval: FOG score: 9 Goal status: INITIAL  4.  Pt. Increase the FOTO score by 3 points for Pt. perceived improvement with assessment specific ADLs, and IADL tasks.  Baseline: Eval: FOTO score: 48 Goal status: INITIAL  5.  Pt. Will write his name with 75% legibility in preparation for signing documents.  Baseline: Eval: Pt. signed his name with 50% legibility. Goal status: INITIAL    ASSESSMENT:  CLINICAL IMPRESSION:  Pt. required each of the repetitive MDE's to be performed in the adaptive version with a chair. Pt. required consistent verbal cues, tactile cues, visual demonstration, and modeling of each exercise. Pt. required supervision during the repetitive MDE's, however required CGA when attempting to increase amplitude of the LE movements.  Pt. required cues for upright posture when standing and reaching. Pt. requires tactile cues for LE position, UE alignment, and hand position. Pt. required cues to lift each foot when walking.  Pt. continues to benefit from OT services for the LSVT BIG program to specifically focus on increasing the amplitude of movements for improved UE functioning, balance, and safety during  ADLs, and IADL tasks while reducing overall fall risk.       PERFORMANCE DEFICITS: in functional skills including ADLs, IADLs, coordination, ROM, strength, Fine motor control, and Gross motor control, cognitive skills including memory and problem solving, and psychosocial skills including coping strategies, environmental adaptation, habits, interpersonal interactions, and routines and behaviors.   IMPAIRMENTS: are limiting patient from ADLs, IADLs, and leisure.   CO-MORBIDITIES: may have co-morbidities  that affects occupational performance. Patient will benefit from skilled OT to address above impairments and improve overall function.  MODIFICATION OR ASSISTANCE TO COMPLETE EVALUATION: Min-Moderate modification of tasks or assist with assess necessary to complete an evaluation.  OT OCCUPATIONAL PROFILE AND HISTORY: Detailed assessment: Review of records and additional review of physical, cognitive, psychosocial history related to current functional performance.  CLINICAL DECISION MAKING: Moderate - several treatment options, min-mod task modification necessary  REHAB POTENTIAL: Good  EVALUATION COMPLEXITY: Moderate    PLAN:  OT FREQUENCY: 4x/week  OT DURATION: 6 weeks  PLANNED INTERVENTIONS: self care/ADL training, therapeutic exercise, therapeutic activity, neuromuscular re-education, manual therapy, passive range of motion, functional mobility training, moist heat, patient/family education, cognitive remediation/compensation, and DME and/or AE instructions  RECOMMENDED OTHER SERVICES: SLP  CONSULTED AND AGREED WITH PLAN OF CARE: Patient and family member/caregiver  PLAN FOR NEXT SESSION: Initiate treatment, perform the 6 min. walk test.  Harrel Carina, MS, OTR/L  Harrel Carina, OT 07/18/2022, 4:10 PM

## 2022-07-19 ENCOUNTER — Ambulatory Visit: Payer: Medicare HMO | Admitting: Speech Pathology

## 2022-07-19 ENCOUNTER — Ambulatory Visit: Payer: Medicare HMO | Admitting: Occupational Therapy

## 2022-07-19 DIAGNOSIS — R41841 Cognitive communication deficit: Secondary | ICD-10-CM | POA: Diagnosis not present

## 2022-07-19 DIAGNOSIS — M6281 Muscle weakness (generalized): Secondary | ICD-10-CM

## 2022-07-19 NOTE — Therapy (Signed)
OUTPATIENT OCCUPATIONAL THERAPY NEURO/LSVT TREATMENT  Patient Name: Neil Bailey MRN: [id number] DOB:1951/05/22, 72 y.o., male Today's Date: 07/19/2022  PCP: Emily Filbert, MD REFERRING PROVIDER: Jennings Books, MD  END OF SESSION:  OT End of Session - 07/19/22 1117     Visit Number 3    Number of Visits 17    Date for OT Re-Evaluation 08/25/22    OT Start Time 1000    OT Stop Time 1103    OT Time Calculation (min) 63 min    Activity Tolerance Patient tolerated treatment well    Behavior During Therapy [place] for tasks assessed/performed             Past Medical History:  Diagnosis Date   Aortic atherosclerosis ([place]) 03/09/2020   Noted on abdominal CT   Atrial fibrillation ([place])    COVID-19 06/2019   DVT (deep venous thrombosis) ([facility])    Dyspnea    since covid dec 2020   Dysrhythmia    paroxysmal atrial fibrillation   HTN (hypertension)    Lab test positive for detection of COVID-19 virus 06/30/2019   Morbid obesity ([place])    Pedal edema    Renal cyst, acquired, right    Thrombocytopenia ([place])    Past Surgical History:  Procedure Laterality Date   COLONOSCOPY  7-8 years ago   [facility]   COLONOSCOPY WITH PROPOFOL N/A 08/26/2020   Procedure: COLONOSCOPY WITH PROPOFOL;  Surgeon: Toledo, Benay Pike, MD;  Location: [facility] ENDOSCOPY;  Service: Gastroenterology;  Laterality: N/A;   CYSTOSCOPY Left 04/03/2020   Procedure: CYSTOSCOPY Birdena Crandall;  Surgeon: [name], MD;  Location: [facility] ORS;  Service: Urology;  Laterality: Left;   EYE SURGERY  05/2014, 06/2014   cataracts   HERNIA REPAIR     HYDROCELE EXCISION Right 08/07/2020   Procedure: HYDROCELECTOMY ADULT;  Surgeon: [name], MD;  Location: [facility] ORS;  Service: Urology;  Laterality: Right;   INGUINAL HERNIA REPAIR Right 01/19/2015   Procedure: RIGHT INCARCERATED INGUINAL HERNIA REPAIR;  Surgeon: Christene Lye, MD;  Location: [facility] ORS;  Service: General;  Laterality: Right;   TONSILLECTOMY     XI ROBOTIC  ASSISTED INGUINAL HERNIA REPAIR WITH MESH Left 04/03/2020   Procedure: XI ROBOTIC ASSISTED INGUINAL HERNIA REPAIR WITH MESH, possible bilateral;  Surgeon: Jules Husbands, MD;  Location: [facility] ORS;  Service: General;  Laterality: Left;   Patient Active Problem List   Diagnosis Date Noted   SOB (shortness of breath) on exertion 06/23/2020   Pedal edema 06/08/2020   Renal cyst, acquired, right 06/08/2020   Thrombocytopenia ([place]) 02/11/2020   Acute deep vein thrombosis (DVT) of popliteal vein of right lower extremity ([place]) 07/18/2019   History of 2019 novel coronavirus disease (COVID-19) 07/18/2019   Acute respiratory failure with hypoxia ([place]) 07/04/2019   Atrial fibrillation, chronic ([place]) 07/04/2019   Pneumonia due to COVID-19 virus 07/01/2019   CAP (community acquired pneumonia) 06/30/2019   Medicare annual wellness visit, initial 03/20/2017   Morbid obesity with BMI of 40.0-44.9, adult ([place]) 12/29/2016   History of DVT (deep vein thrombosis) 10/17/2013    ONSET DATE: 12/09/2021  REFERRING DIAG: Parkinson's Disease  THERAPY DIAG:  Muscle weakness (generalized)  Rationale for Evaluation and Treatment: Rehabilitation  SUBJECTIVE:   SUBJECTIVE STATEMENT:  Pt. Reports doing well today.  Pt accompanied by: self  PERTINENT HISTORY:  Pt. is a 72 y.o. male who was diagnosed with Parkinson's Disease in June of 2023.   PRECAUTIONS: None  WEIGHT BEARING RESTRICTIONS: No  PAIN:  Are you having pain? Left: 6/10, Right: 8-9/10  FALLS: Has patient fallen in last 6 months? No  LIVING ENVIRONMENT: Lives with: lives with their spouse Lives in: House/apartment Stairs: 3- 4 steps to enter. 2 story home Has following equipment at home: None  PLOF: Independent  PATIENT GOALS: Regain stability  OBJECTIVE:   HAND DOMINANCE: Ambidextrous  ADLs: Overall ADLs:   Transfers/ambulation related to ADLs: Eating:  Tremors, uses a spoon instead of a fork Grooming: Difficulty aligning   toothpaste on the tooth brush, difficulty cutting toenails-Consistently goes to a nail salon to have them done.  UB Dressing:  Independent LB Dressing: Independent, increased time to complete Toileting: Independent Bathing: Independent Tub Shower transfers: Independent    IADLs: Shopping: Independent Light housekeeping: No changes noted  Meal Prep: Does not cook, grills Community mobility:  Driving Independently Medication management: Independent with weekly pillbox Financial management: No changes noted Handwriting: 50% legible Name in cursive writing  MOBILITY STATUS: Independent  POSTURE COMMENTS:   Sitting balance:  Good unsupported sitting balance  ACTIVITY TOLERANCE: Activity tolerance: Pt. requires rest breaks during standing assessments  FUNCTIONAL OUTCOME MEASURES:  FOTO: 48 5x's STS: 18 sec. TUG: 11 sec. Freezing of Gait: 9 BERG Balance Scale: 43/56  07/18/2022:   6 minute walk test: 890 ft. with a seated rest break required at the end.   UPPER EXTREMITY ROM:    Active ROM Right Eval WFL  Left Eval [facility]  Shoulder flexion    Shoulder abduction    Shoulder adduction    Shoulder extension    Shoulder internal rotation    Shoulder external rotation    Elbow flexion    Elbow extension    Wrist flexion    Wrist extension    Wrist ulnar deviation    Wrist radial deviation    Wrist pronation    Wrist supination    (Blank rows = not tested)  UPPER EXTREMITY MMT:     MMT Right Eval 5/5 Left Eval 5/5  Shoulder flexion    Shoulder abduction    Shoulder adduction    Shoulder extension    Shoulder internal rotation    Shoulder external rotation    Middle trapezius    Lower trapezius    Elbow flexion    Elbow extension    Wrist flexion    Wrist extension    Wrist ulnar deviation    Wrist radial deviation    Wrist pronation    Wrist supination    (Blank rows = not tested)  HAND FUNCTION: Grip strength: Right: 68 lbs; Left: 64 lbs and  Lateral pinch: Right: 18 lbs, Left: 18 lbs 3pt. Pinch Right: 18 lbs, Left: 17 lbs  COORDINATION: 9 Hole Peg test: Right: 33 sec; Left: 27 sec  SENSATION: WFL  EDEMA: N/A  MUSCLE TONE:  WFL  COGNITION: Overall cognitive status: Pt. reports cognitive/memory changes. Pt. being seen by SLP  VISION: Subjective report:  Pt. reports no changes with vision.  VISION ASSESSMENT:  WFL for tasks assessed  PERCEPTION:  TBA  PRAXIS: Impaired: Motor planning    TODAY'S TREATMENT:  DATE: 07/18/2022   LSVT: Patient seen for LSVT Daily Session Maximal Daily Exercises for facilitation/coordination of movement Maximum Sustained Movements are designed to rescale the amplitude of movement output for generalization to daily functional activities. Performed as follows for 1 set of 10 repetitions each multi-directional sustained movements: 1) Floor to ceiling- Pt. presents with limited forward flexion towards the floor, however was able to reach further to the floor today.  2) Side to side multidirectional- Pt. continues to present with limited turning while shifting the posterior leg back in one movement resulting in less elongation, and extension of the LE. Fewer cues required for increased amplitude of hand movements.  Repetitive movements performed in standing and are designed to provide retraining effort needed for sustained muscle activation in tasks. Performed as follows for 1 set of 10 repetitions each of multi-directional repetitive movements. Pt. Continues to require the adapted version with a chair for each of the repetitive movements.   3) Step and reach forward step- Pt. Presents with increased incoordination when forward stepping with the LLE, often completing the step out to the left. Pt. required cues for upright posture. 4) Step and reach sideways step- Pt.  required verbal cues to increase right knee flexion when stepping to the left.  5) Step and reach backwards step- Limited arm swing posteriorly noted. Limited step posteriorly. Pt. Initially perfromed the movements increments, requiring step by step cues, and visual modeling. As the task progressed, the Pt. Was able to complet the movements more fluidly. 6) Rock and reach forward/backward- Pt. Was able to complete the movement today incorporating one arm while the opposite arm was stabilized on a chair for the duration of the exercises.  Pt. Required fewer cues to lift toes. 7) Rock and reach sideways- Pt. continues to present with limited rotation through the trunk when turning to look over his shoulders. Pt. Required cues for UE extension, and to spread digits.   Functional Component Task:  1.Sit to stand BIG functional component task for 5 reps with visual visual demonstration, visual, and tactile cues. Pt. presents with limited forward flexion in anticipation for pushing through his legs to stand, and limited forward hip flexion when transitioning back to sitting. 2.Stepping over objects: Pt. worked on balance tasks alternating stepping onto designated cones while alternating high knees in between. Pt. used a chair for stability. 3.Increasing Arm swing: Pt. worked on reps on increasing the amplitude of posterior arm swing using the wall for feedback. 4:Turning/Elongating the trunk 5: Formulating large letters for his signature  BIG ambulation:    Pt. performed BIG Ambulation with emphasis placed on increasing amplitude of movements for arm swing, and large, long steps. Pt. was able to complete 53 ft. Pt. presented with difficulty coordinating UE's, and LE's with increased amplitude. The task was modified to 53 ft. with increased arm swing only, followed by 53 ft. of large amplitude stepping only. Pt. completed a total of 168 ft. of BIG ambulation.  Pt. Presents with 6/10 Left knee pain, and  8-9/10 Right knee pain. Pt. continues to require each of the repetitive MDE's to be performed in the adapted version with a chair. Pt. continues to require consistent verbal cues, tactile cues, visual demonstration, and modeling of each exercise. Pt. required supervision during the repetitive MDE's, and continues to require CGA when attempting to increase the amplitude of the LE movements, and during BIG Ambulation. Pt. required cues for upright posture when standing and reaching. Pt. has improved with full digit extension.  Pt. required tactile cues for LE position, UE alignment, and hand position. Pt. required cues to lift each foot when walking. Pt. continues to benefit from OT services for the LSVT BIG program to specifically focus on increasing the amplitude of movements for improved UE functioning, balance, and safety during ADLs, and IADL tasks while reducing overall fall risk.      PATIENT EDUCATION: Education details: LSVT MDE's Person educated: Patient Education method: Explanation, Demonstration, Tactile cues, and Verbal cues Education comprehension: verbalized understanding  HOME EXERCISE PROGRAM:  To be determined    GOALS: Goals reviewed with patient? Yes  SHORT TERM GOALS: Target date: 08/04/2022    Pt. Will be independent with HEPs for Maximal Daily Exercises, including modifications. Baseline: Eval: No current HEP. Training to begin for the MDE on the next visit. Goal status: INITIAL    LONG TERM GOALS: Target date: 08/25/2022    Pt. Will be independent with the Established LSVT  Functional component, and Hierarchy tasks.  Baseline: Eval: Functional component tasks, and hierarchy tasks to be established Goal status: INITIAL  2.  Pt. Will improve the BERG Balance score by 5 points to reduce fall risk during daily tasks. Baseline: Eval: BERG Balance score: 43/56 Goal status: INITIAL  3.  Pt. Will improve the Freezing of Gait score by 3 points.  Baseline: Eval: FOG  score: 9 Goal status: INITIAL  4.  Pt. Increase the FOTO score by 3 points for Pt. perceived improvement with assessment specific ADLs, and IADL tasks.  Baseline: Eval: FOTO score: 48 Goal status: INITIAL  5.  Pt. Will write his name with 75% legibility in preparation for signing documents.  Baseline: Eval: Pt. signed his name with 50% legibility. Goal status: INITIAL    ASSESSMENT:  CLINICAL IMPRESSION:  Pt. Presents with 6/10 Left knee pain, and 8-9/10 Right knee pain. Pt. continues to require each of the repetitive MDE's to be performed in the adapted version with a chair. Pt. continues to require consistent verbal cues, tactile cues, visual demonstration, and modeling of each exercise. Pt. required supervision during the repetitive MDE's, and continues to require CGA when attempting to increase the amplitude of the LE movements, and during BIG Ambulation. Pt. required cues for upright posture when standing and reaching. Pt. has improved with full digit extension. Pt. required tactile cues for LE position, UE alignment, and hand position. Pt. required cues to lift each foot when walking. Pt. continues to benefit from OT services for the LSVT BIG program to specifically focus on increasing the amplitude of movements for improved UE functioning, balance, and safety during ADLs, and IADL tasks while reducing overall fall risk.     PERFORMANCE DEFICITS: in functional skills including ADLs, IADLs, coordination, ROM, strength, Fine motor control, and Gross motor control, cognitive skills including memory and problem solving, and psychosocial skills including coping strategies, environmental adaptation, habits, interpersonal interactions, and routines and behaviors.   IMPAIRMENTS: are limiting patient from ADLs, IADLs, and leisure.   CO-MORBIDITIES: may have co-morbidities  that affects occupational performance. Patient will benefit from skilled OT to address above impairments and improve overall  function.  MODIFICATION OR ASSISTANCE TO COMPLETE EVALUATION: Min-Moderate modification of tasks or assist with assess necessary to complete an evaluation.  OT OCCUPATIONAL PROFILE AND HISTORY: Detailed assessment: Review of records and additional review of physical, cognitive, psychosocial history related to current functional performance.  CLINICAL DECISION MAKING: Moderate - several treatment options, min-mod task modification necessary  REHAB POTENTIAL: Good  EVALUATION COMPLEXITY: Moderate  PLAN:  OT FREQUENCY: 4x/week  OT DURATION: 6 weeks  PLANNED INTERVENTIONS: self care/ADL training, therapeutic exercise, therapeutic activity, neuromuscular re-education, manual therapy, passive range of motion, functional mobility training, moist heat, patient/family education, cognitive remediation/compensation, and DME and/or AE instructions  RECOMMENDED OTHER SERVICES: SLP  CONSULTED AND AGREED WITH PLAN OF CARE: Patient and family member/caregiver  PLAN FOR NEXT SESSION: Initiate treatment, perform the 6 min. walk test.  Harrel Carina, MS, OTR/L  Harrel Carina, OT 07/19/2022, 11:22 AM

## 2022-07-20 ENCOUNTER — Encounter: Payer: Medicare HMO | Admitting: Speech Pathology

## 2022-07-20 ENCOUNTER — Ambulatory Visit: Payer: Medicare HMO | Admitting: Occupational Therapy

## 2022-07-20 DIAGNOSIS — R278 Other lack of coordination: Secondary | ICD-10-CM

## 2022-07-20 DIAGNOSIS — R41841 Cognitive communication deficit: Secondary | ICD-10-CM | POA: Diagnosis not present

## 2022-07-20 DIAGNOSIS — M6281 Muscle weakness (generalized): Secondary | ICD-10-CM

## 2022-07-21 ENCOUNTER — Encounter: Payer: Self-pay | Admitting: Occupational Therapy

## 2022-07-21 NOTE — Therapy (Addendum)
OUTPATIENT OCCUPATIONAL THERAPY NEURO/LSVT TREATMENT  Patient Name: Neil Bailey MRN: [id number] DOB:03-Nov-1950, 72 y.o., male Today's Date: 07/21/2022  PCP: Emily Filbert, MD REFERRING PROVIDER: Jennings Books, MD  END OF SESSION:  OT End of Session - 07/21/22 1347     Visit Number 4    Number of Visits 17    Date for OT Re-Evaluation 08/25/22    Authorization Time Period Progress report period starting 07/14/2022    OT Start Time 0900    OT Stop Time 1000    OT Time Calculation (min) 60 min    Activity Tolerance Patient tolerated treatment well    Behavior During Therapy [facility] for tasks assessed/performed             Past Medical History:  Diagnosis Date   Aortic atherosclerosis ([place]) 03/09/2020   Noted on abdominal CT   Atrial fibrillation ([place])    COVID-19 06/2019   DVT (deep venous thrombosis) ([facility])    Dyspnea    since covid dec 2020   Dysrhythmia    paroxysmal atrial fibrillation   HTN (hypertension)    Lab test positive for detection of COVID-19 virus 06/30/2019   Morbid obesity ([place])    Pedal edema    Renal cyst, acquired, right    Thrombocytopenia ([place])    Past Surgical History:  Procedure Laterality Date   COLONOSCOPY  7-8 years ago   [facility]   COLONOSCOPY WITH PROPOFOL N/A 08/26/2020   Procedure: COLONOSCOPY WITH PROPOFOL;  Surgeon: Toledo, Benay Pike, MD;  Location: [facility] ENDOSCOPY;  Service: Gastroenterology;  Laterality: N/A;   CYSTOSCOPY Left 04/03/2020   Procedure: CYSTOSCOPY Birdena Crandall;  Surgeon: [name], MD;  Location: [facility] ORS;  Service: Urology;  Laterality: Left;   EYE SURGERY  05/2014, 06/2014   cataracts   HERNIA REPAIR     HYDROCELE EXCISION Right 08/07/2020   Procedure: HYDROCELECTOMY ADULT;  Surgeon: [name], MD;  Location: [facility] ORS;  Service: Urology;  Laterality: Right;   INGUINAL HERNIA REPAIR Right 01/19/2015   Procedure: RIGHT INCARCERATED INGUINAL HERNIA REPAIR;  Surgeon: Christene Lye, MD;  Location: [facility]  ORS;  Service: General;  Laterality: Right;   TONSILLECTOMY     XI ROBOTIC ASSISTED INGUINAL HERNIA REPAIR WITH MESH Left 04/03/2020   Procedure: XI ROBOTIC ASSISTED INGUINAL HERNIA REPAIR WITH MESH, possible bilateral;  Surgeon: Jules Husbands, MD;  Location: [facility] ORS;  Service: General;  Laterality: Left;   Patient Active Problem List   Diagnosis Date Noted   SOB (shortness of breath) on exertion 06/23/2020   Pedal edema 06/08/2020   Renal cyst, acquired, right 06/08/2020   Thrombocytopenia ([place]) 02/11/2020   Acute deep vein thrombosis (DVT) of popliteal vein of right lower extremity ([place]) 07/18/2019   History of 2019 novel coronavirus disease (COVID-19) 07/18/2019   Acute respiratory failure with hypoxia ([place]) 07/04/2019   Atrial fibrillation, chronic ([place]) 07/04/2019   Pneumonia due to COVID-19 virus 07/01/2019   CAP (community acquired pneumonia) 06/30/2019   Medicare annual wellness visit, initial 03/20/2017   Morbid obesity with BMI of 40.0-44.9, adult ([place]) 12/29/2016   History of DVT (deep vein thrombosis) 10/17/2013    ONSET DATE: 12/09/2021  REFERRING DIAG: Parkinson's Disease  THERAPY DIAG:  Muscle weakness (generalized)  Other lack of coordination  Rationale for Evaluation and Treatment: Rehabilitation  SUBJECTIVE:   SUBJECTIVE STATEMENT:  Pt reports he can tell he has been doing exercises, reports he is glad to have the day off tomorrow and return on  Friday.  We discussed even though he will not be attending a therapy session tomorrow, it is anticipated he would engage in his HEP of maximal daily exercises, twice a day.    Pt accompanied by: self  PERTINENT HISTORY:  Pt. is a 72 y.o. male who was diagnosed with Parkinson's Disease in June of 2023.   PRECAUTIONS: None  WEIGHT BEARING RESTRICTIONS: No  PAIN:  Are you having pain? Knee pain Left: 6/10, Right: 8-9/10  FALLS: Has patient fallen in last 6 months? No  LIVING ENVIRONMENT: Lives with: lives  with their spouse Lives in: House/apartment Stairs: 3- 4 steps to enter. 2 story home Has following equipment at home: None  PLOF: Independent  PATIENT GOALS: Regain stability  OBJECTIVE:   HAND DOMINANCE: Ambidextrous  ADLs: Overall ADLs:   Transfers/ambulation related to ADLs: Eating:  Tremors, uses a spoon instead of a fork Grooming: Difficulty aligning  toothpaste on the tooth brush, difficulty cutting toenails-Consistently goes to a nail salon to have them done.  UB Dressing:  Independent LB Dressing: Independent, increased time to complete Toileting: Independent Bathing: Independent Tub Shower transfers: Independent    IADLs: Shopping: Independent Light housekeeping: No changes noted  Meal Prep: Does not cook, grills Community mobility:  Driving Independently Medication management: Independent with weekly pillbox Financial management: No changes noted Handwriting: 50% legible Name in cursive writing  MOBILITY STATUS: Independent  POSTURE COMMENTS:   Sitting balance:  Good unsupported sitting balance  ACTIVITY TOLERANCE: Activity tolerance: Pt. requires rest breaks during standing assessments  FUNCTIONAL OUTCOME MEASURES:  FOTO: 48 5x's STS: 18 sec. TUG: 11 sec. Freezing of Gait: 9 BERG Balance Scale: 43/56  07/18/2022:   6 minute walk test: 890 ft. with a seated rest break required at the end.   UPPER EXTREMITY ROM:    Active ROM Right Eval WFL  Left Eval [facility]  Shoulder flexion    Shoulder abduction    Shoulder adduction    Shoulder extension    Shoulder internal rotation    Shoulder external rotation    Elbow flexion    Elbow extension    Wrist flexion    Wrist extension    Wrist ulnar deviation    Wrist radial deviation    Wrist pronation    Wrist supination    (Blank rows = not tested)  UPPER EXTREMITY MMT:     MMT Right Eval 5/5 Left Eval 5/5  Shoulder flexion    Shoulder abduction    Shoulder adduction    Shoulder  extension    Shoulder internal rotation    Shoulder external rotation    Middle trapezius    Lower trapezius    Elbow flexion    Elbow extension    Wrist flexion    Wrist extension    Wrist ulnar deviation    Wrist radial deviation    Wrist pronation    Wrist supination    (Blank rows = not tested)  HAND FUNCTION: Grip strength: Right: 68 lbs; Left: 64 lbs and Lateral pinch: Right: 18 lbs, Left: 18 lbs 3pt. Pinch Right: 18 lbs, Left: 17 lbs  COORDINATION: 9 Hole Peg test: Right: 33 sec; Left: 27 sec  SENSATION: WFL  EDEMA: N/A  MUSCLE TONE:  WFL  COGNITION: Overall cognitive status: Pt. reports cognitive/memory changes. Pt. being seen by SLP  VISION: Subjective report:  Pt. reports no changes with vision.  VISION ASSESSMENT:  WFL for tasks assessed  PERCEPTION:  TBA  PRAXIS: Impaired: Motor planning  TODAY'S TREATMENT:                                                                                                                              DATE: 07/20/2022   LSVT: Patient seen for LSVT Daily Session Maximal Daily Exercises for facilitation/coordination of movement Maximum Sustained Movements are designed to rescale the amplitude of movement output for generalization to daily functional activities. Performed as follows for 1 set of 10 repetitions each multi-directional sustained movements: 1) Floor to ceiling- Pt. presents with limited forward flexion towards the floor but has demonstrated improved ROM over the week. 2) Side to side multidirectional- Pt. continues to present with limited turning while shifting the posterior leg back in one movement resulting in less elongation, and extension of the LE. Fewer cues required for increased amplitude of hand movements. May require therapist guiding and assist to facilitate greater hip and leg extension over next sessions.  Repetitive movements performed in standing and are designed to provide retraining effort needed  for sustained muscle activation in tasks. Performed as follows for 1 set of 10 repetitions each of multi-directional repetitive movements. Pt. Continues to require the adapted version with a chair for each of the repetitive movements.   3) Step and reach forward step-  Pt. required cues for upright posture. 4) Step and reach sideways step- Pt. required verbal cues for proper form and technique, head turns to side of stepping direction. 5) Step and reach backwards step- Limited arm swing posteriorly noted. Limited step posteriorly. Cues for shifting weight to posterior foot when stepping back. 6) Rock and reach forward/backward- Pt. Was able to complete the movement today incorporating one arm while the opposite arm was stabilized on a chair for the duration of the exercises.  Cues for weight shifting forward and back, limited amplitude of movement with this exercise and would benefit from continued focus and facilitation of larger movement pattern with weight shift.  7) Rock and reach sideways- Pt. continues to present with limited rotation through the trunk when turning to look over his shoulders. Pt. Required cues for UE extension, and to spread digits.   All maximal daily exercises were performed in an ADAPTED version this date, the adapted version of the exercises utilizes the use of a chair for balance while performing exercises in standing.   Functional Component Task:  1.Sit to stand BIG functional component task for 5 reps with visual visual demonstration, visual, and tactile cues. Pt. presents with limited forward flexion in anticipation for pushing through his legs to stand, and limited forward hip flexion when transitioning back to sitting. 2.Stepping over objects: Pt. worked on balance tasks alternating stepping onto designated cones while alternating high knees in between. Pt. used a chair for stability. 3.Increasing Arm swing during functional mobility with cues for a more natural  reciprocal arm swing. 4:Turning/Elongating the trunk 5: Formulating large letters for his signature  BIG ambulation:    Pt. performed BIG  Ambulation with emphasis placed on increasing amplitude of movements for arm swing, and large, long steps. Pt. was able to complete 100 ft for 2 trials with a short rest break as needed.  Pt. presented with difficulty coordinating UE's, and LE's with increased amplitude.    PATIENT EDUCATION: Education details: LSVT MDE's Person educated: Patient Education method: Explanation, Demonstration, Tactile cues, and Verbal cues Education comprehension: verbalized understanding  HOME EXERCISE PROGRAM:  To be determined    GOALS: Goals reviewed with patient? Yes  SHORT TERM GOALS: Target date: 08/04/2022    Pt. Will be independent with HEPs for Maximal Daily Exercises, including modifications. Baseline: Eval: No current HEP. Training to begin for the MDE on the next visit. Goal status: INITIAL    LONG TERM GOALS: Target date: 08/25/2022    Pt. Will be independent with the Established LSVT  Functional component, and Hierarchy tasks.  Baseline: Eval: Functional component tasks, and hierarchy tasks to be established Goal status: INITIAL  2.  Pt. Will improve the BERG Balance score by 5 points to reduce fall risk during daily tasks. Baseline: Eval: BERG Balance score: 43/56 Goal status: INITIAL  3.  Pt. Will improve the Freezing of Gait score by 3 points.  Baseline: Eval: FOG score: 9 Goal status: INITIAL  4.  Pt. Increase the FOTO score by 3 points for Pt. perceived improvement with assessment specific ADLs, and IADL tasks.  Baseline: Eval: FOTO score: 48 Goal status: INITIAL  5.  Pt. Will write his name with 75% legibility in preparation for signing documents.  Baseline: Eval: Pt. signed his name with 50% legibility. Goal status: INITIAL    ASSESSMENT:  CLINICAL IMPRESSION: Pt demonstrating progress over the week with performance of  maximal daily exercises, he is becoming more familiar with each exercise but continues to require cues, verbal and tactile for proper form and technique.  Pt's endurance to activity is limited and requires rest breaks during session and with ambulation.  Decreased reciprocal arm swing during functional mobility and demonstrates incoordination when purposely trying to increase amplitude of arm movements.  Worked this date on trying to create more of a natural arm swing but with increased amplitude.  Continue to work towards goals to improve ROM, balance, strength, amplitude of movement patterns and decrease risk for falls.   PERFORMANCE DEFICITS: in functional skills including ADLs, IADLs, coordination, ROM, strength, Fine motor control, and Gross motor control, cognitive skills including memory and problem solving, and psychosocial skills including coping strategies, environmental adaptation, habits, interpersonal interactions, and routines and behaviors.   IMPAIRMENTS: are limiting patient from ADLs, IADLs, and leisure.   CO-MORBIDITIES: may have co-morbidities  that affects occupational performance. Patient will benefit from skilled OT to address above impairments and improve overall function.  MODIFICATION OR ASSISTANCE TO COMPLETE EVALUATION: Min-Moderate modification of tasks or assist with assess necessary to complete an evaluation.  OT OCCUPATIONAL PROFILE AND HISTORY: Detailed assessment: Review of records and additional review of physical, cognitive, psychosocial history related to current functional performance.  CLINICAL DECISION MAKING: Moderate - several treatment options, min-mod task modification necessary  REHAB POTENTIAL: Good  EVALUATION COMPLEXITY: Moderate    PLAN:  OT FREQUENCY: 4x/week  OT DURATION: 6 weeks  PLANNED INTERVENTIONS: self care/ADL training, therapeutic exercise, therapeutic activity, neuromuscular re-education, manual therapy, passive range of motion,  functional mobility training, moist heat, patient/family education, cognitive remediation/compensation, and DME and/or AE instructions  RECOMMENDED OTHER SERVICES: SLP  CONSULTED AND AGREED WITH PLAN OF CARE: Patient and family  member/caregiver  PLAN FOR NEXT SESSION: Initiate treatment, perform the 6 min. walk test.  Achilles Dunk, OTR/L, CLT  Sonji Starkes, OT 07/21/2022, 1:49 PM

## 2022-07-22 ENCOUNTER — Ambulatory Visit: Payer: Medicare HMO | Admitting: Speech Pathology

## 2022-07-22 ENCOUNTER — Encounter: Payer: Self-pay | Admitting: Occupational Therapy

## 2022-07-22 ENCOUNTER — Ambulatory Visit: Payer: Medicare HMO | Admitting: Occupational Therapy

## 2022-07-22 DIAGNOSIS — M6281 Muscle weakness (generalized): Secondary | ICD-10-CM

## 2022-07-22 DIAGNOSIS — R278 Other lack of coordination: Secondary | ICD-10-CM

## 2022-07-22 DIAGNOSIS — R41841 Cognitive communication deficit: Secondary | ICD-10-CM | POA: Diagnosis not present

## 2022-07-22 NOTE — Therapy (Signed)
OUTPATIENT OCCUPATIONAL THERAPY NEURO/LSVT TREATMENT  Patient Name: Neil Bailey MRN: [id number] DOB:Sep 04, 1950, 72 y.o., male Today's Date: 07/22/2022  PCP: Emily Filbert, MD REFERRING PROVIDER: Jennings Books, MD  END OF SESSION:  OT End of Session - 07/22/22 1114     Visit Number 5    Number of Visits 17    Date for OT Re-Evaluation 08/25/22    Authorization Time Period Progress report period starting 07/14/2022    OT Start Time 0900    OT Stop Time 1001    OT Time Calculation (min) 61 min    Activity Tolerance Patient tolerated treatment well    Behavior During Therapy [facility] for tasks assessed/performed             Past Medical History:  Diagnosis Date   Aortic atherosclerosis ([place]) 03/09/2020   Noted on abdominal CT   Atrial fibrillation ([place])    COVID-19 06/2019   DVT (deep venous thrombosis) (HCC)    Dyspnea    since covid dec 2020   Dysrhythmia    paroxysmal atrial fibrillation   HTN (hypertension)    Lab test positive for detection of COVID-19 virus 06/30/2019   Morbid obesity ([place])    Pedal edema    Renal cyst, acquired, right    Thrombocytopenia ([place])    Past Surgical History:  Procedure Laterality Date   COLONOSCOPY  7-8 years ago   [facility]   COLONOSCOPY WITH PROPOFOL N/A 08/26/2020   Procedure: COLONOSCOPY WITH PROPOFOL;  Surgeon: Toledo, Benay Pike, MD;  Location: [facility] ENDOSCOPY;  Service: Gastroenterology;  Laterality: N/A;   CYSTOSCOPY Left 04/03/2020   Procedure: CYSTOSCOPY Birdena Crandall;  Surgeon: [name], MD;  Location: [facility] ORS;  Service: Urology;  Laterality: Left;   EYE SURGERY  05/2014, 06/2014   cataracts   HERNIA REPAIR     HYDROCELE EXCISION Right 08/07/2020   Procedure: HYDROCELECTOMY ADULT;  Surgeon: [name], MD;  Location: [facility] ORS;  Service: Urology;  Laterality: Right;   INGUINAL HERNIA REPAIR Right 01/19/2015   Procedure: RIGHT INCARCERATED INGUINAL HERNIA REPAIR;  Surgeon: Christene Lye, MD;  Location: [facility]  ORS;  Service: General;  Laterality: Right;   TONSILLECTOMY     XI ROBOTIC ASSISTED INGUINAL HERNIA REPAIR WITH MESH Left 04/03/2020   Procedure: XI ROBOTIC ASSISTED INGUINAL HERNIA REPAIR WITH MESH, possible bilateral;  Surgeon: Jules Husbands, MD;  Location: [facility] ORS;  Service: General;  Laterality: Left;   Patient Active Problem List   Diagnosis Date Noted   SOB (shortness of breath) on exertion 06/23/2020   Pedal edema 06/08/2020   Renal cyst, acquired, right 06/08/2020   Thrombocytopenia ([place]) 02/11/2020   Acute deep vein thrombosis (DVT) of popliteal vein of right lower extremity ([place]) 07/18/2019   History of 2019 novel coronavirus disease (COVID-19) 07/18/2019   Acute respiratory failure with hypoxia ([place]) 07/04/2019   Atrial fibrillation, chronic ([place]) 07/04/2019   Pneumonia due to COVID-19 virus 07/01/2019   CAP (community acquired pneumonia) 06/30/2019   Medicare annual wellness visit, initial 03/20/2017   Morbid obesity with BMI of 40.0-44.9, adult ([place]) 12/29/2016   History of DVT (deep vein thrombosis) 10/17/2013    ONSET DATE: 12/09/2021  REFERRING DIAG: Parkinson's Disease  THERAPY DIAG:  Muscle weakness (generalized)  Other lack of coordination  Rationale for Evaluation and Treatment: Rehabilitation  SUBJECTIVE:   SUBJECTIVE STATEMENT:  Pt reports he is doing well, spent yesterday trying to get metal together to take to scrap yard but still did exercises last  night.  Pain remains in knees, unchanged from last session.   Pt accompanied by: self  PERTINENT HISTORY:  Pt. is a 72 y.o. male who was diagnosed with Parkinson's Disease in June of 2023.   PRECAUTIONS: None  WEIGHT BEARING RESTRICTIONS: No  PAIN:  Are you having pain? Knee pain Left: 6/10, Right: 8-9/10  FALLS: Has patient fallen in last 6 months? No  LIVING ENVIRONMENT: Lives with: lives with their spouse Lives in: House/apartment Stairs: 3- 4 steps to enter. 2 story home Has following  equipment at home: None  PLOF: Independent  PATIENT GOALS: Regain stability  OBJECTIVE:   HAND DOMINANCE: Ambidextrous  ADLs: Overall ADLs:   Transfers/ambulation related to ADLs: Eating:  Tremors, uses a spoon instead of a fork Grooming: Difficulty aligning  toothpaste on the tooth brush, difficulty cutting toenails-Consistently goes to a nail salon to have them done.  UB Dressing:  Independent LB Dressing: Independent, increased time to complete Toileting: Independent Bathing: Independent Tub Shower transfers: Independent    IADLs: Shopping: Independent Light housekeeping: No changes noted  Meal Prep: Does not cook, grills Community mobility:  Driving Independently Medication management: Independent with weekly pillbox Financial management: No changes noted Handwriting: 50% legible Name in cursive writing  MOBILITY STATUS: Independent  POSTURE COMMENTS:   Sitting balance:  Good unsupported sitting balance  ACTIVITY TOLERANCE: Activity tolerance: Pt. requires rest breaks during standing assessments  FUNCTIONAL OUTCOME MEASURES:  FOTO: 48 5x's STS: 18 sec. TUG: 11 sec. Freezing of Gait: 9 BERG Balance Scale: 43/56  07/18/2022:   6 minute walk test: 890 ft. with a seated rest break required at the end.   UPPER EXTREMITY ROM:    Active ROM Right Eval WFL  Left Eval [facility]  Shoulder flexion    Shoulder abduction    Shoulder adduction    Shoulder extension    Shoulder internal rotation    Shoulder external rotation    Elbow flexion    Elbow extension    Wrist flexion    Wrist extension    Wrist ulnar deviation    Wrist radial deviation    Wrist pronation    Wrist supination    (Blank rows = not tested)  UPPER EXTREMITY MMT:     MMT Right Eval 5/5 Left Eval 5/5  Shoulder flexion    Shoulder abduction    Shoulder adduction    Shoulder extension    Shoulder internal rotation    Shoulder external rotation    Middle trapezius    Lower  trapezius    Elbow flexion    Elbow extension    Wrist flexion    Wrist extension    Wrist ulnar deviation    Wrist radial deviation    Wrist pronation    Wrist supination    (Blank rows = not tested)  HAND FUNCTION: Grip strength: Right: 68 lbs; Left: 64 lbs and Lateral pinch: Right: 18 lbs, Left: 18 lbs 3pt. Pinch Right: 18 lbs, Left: 17 lbs  COORDINATION: 9 Hole Peg test: Right: 33 sec; Left: 27 sec  SENSATION: WFL  EDEMA: N/A  MUSCLE TONE:  WFL  COGNITION: Overall cognitive status: Pt. reports cognitive/memory changes. Pt. being seen by SLP  VISION: Subjective report:  Pt. reports no changes with vision.  VISION ASSESSMENT:  WFL for tasks assessed  PERCEPTION:  TBA  PRAXIS: Impaired: Motor planning    TODAY'S TREATMENT:  DATE: 07/22/2022   LSVT: Patient seen for LSVT Daily Session Maximal Daily Exercises for facilitation/coordination of movement Maximum Sustained Movements are designed to rescale the amplitude of movement output for generalization to daily functional activities. Performed as follows for 1 set of 10 repetitions each multi-directional sustained movements: 1) Floor to ceiling- Pt. presents with limited forward flexion towards the floor but has demonstrated improved ROM over the week. 2) Side to side multidirectional- Pt. continues to present with limited turning while shifting the posterior leg back in one movement resulting in less elongation, and extension of the LE. May require therapist guiding and assist to facilitate greater hip and leg extension over next sessions but responded well this date to cues. Repetitive movements performed in standing and are designed to provide retraining effort needed for sustained muscle activation in tasks. Performed as follows for 1 set of 10 repetitions each of multi-directional repetitive  movements. Pt. Continues to require the adapted version with a chair for each of the repetitive movements.   3) Step and reach forward step-  Pt. required cues for upright posture. 4) Step and reach sideways step- Pt. required verbal cues for proper form and technique, head turns to side of stepping direction. 5) Step and reach backwards step- Limited arm swing posteriorly noted. Limited step posteriorly, Cues for shifting weight to posterior foot when stepping back. 6) Rock and reach forward/backward-  Cues for weight shifting forward and back, limited amplitude of movement with this exercise and would benefit from continued focus and facilitation of larger movement pattern with weight shift.  7) Rock and reach sideways- Pt. continues to present with limited rotation through the trunk when turning to look over his shoulders. Pt. Required cues for UE extension, and to spread digits.   All maximal daily exercises were performed in an ADAPTED version this date, the adapted version of the exercises utilizes the use of a chair for balance while performing exercises in standing.    Functional Component Task:  1.Sit to stand BIG functional component task for 5 reps with visual visual demonstration, visual, and tactile cues. Pt. presents with limited forward flexion in anticipation for pushing through his legs to stand, and limited forward hip flexion when transitioning back to sitting. 2.Stepping over objects: Pt seen this date with use of cones placed on the floor, one red and one green. Pt performed unilateral tapping of foot on top of cone, alternating right and left and forwards, diagonal patterns.  Towards end of task, pt able to tap each cone with balancing on one foot, therapist providing min assist for balance as needed.  3.Increasing Arm swing during functional mobility with cues for a more natural reciprocal arm swing. 4:Turning/Elongating the trunk 5: Formulating large letters for his signature,  use of lined paper this date and requested patient to make his capital letters from top to bottom of the line and non capital letters 1/2 size of the line.  Multiple lines completed with name and address.   BIG ambulation:    Pt. performed BIG Ambulation with emphasis placed on increasing amplitude of movements for arm swing, and large, long steps. Pt. was able to complete 175 ft for 2 trials with a short rest break as needed.  Pt. presented with continued difficulty coordinating UE's, and LE's with increased amplitude.    PATIENT EDUCATION: Education details: LSVT MDE's Person educated: Patient Education method: Explanation, Demonstration, Tactile cues, and Verbal cues Education comprehension: verbalized understanding  HOME EXERCISE PROGRAM:  To be  determined    GOALS: Goals reviewed with patient? Yes  SHORT TERM GOALS: Target date: 08/04/2022    Pt. Will be independent with HEPs for Maximal Daily Exercises, including modifications. Baseline: Eval: No current HEP. Training to begin for the MDE on the next visit. Goal status: INITIAL    LONG TERM GOALS: Target date: 08/25/2022    Pt. Will be independent with the Established LSVT  Functional component, and Hierarchy tasks.  Baseline: Eval: Functional component tasks, and hierarchy tasks to be established Goal status: INITIAL  2.  Pt. Will improve the BERG Balance score by 5 points to reduce fall risk during daily tasks. Baseline: Eval: BERG Balance score: 43/56 Goal status: INITIAL  3.  Pt. Will improve the Freezing of Gait score by 3 points.  Baseline: Eval: FOG score: 9 Goal status: INITIAL  4.  Pt. Increase the FOTO score by 3 points for Pt. perceived improvement with assessment specific ADLs, and IADL tasks.  Baseline: Eval: FOTO score: 48 Goal status: INITIAL  5.  Pt. Will write his name with 75% legibility in preparation for signing documents.  Baseline: Eval: Pt. signed his name with 50% legibility. Goal status:  INITIAL    ASSESSMENT:  CLINICAL IMPRESSION: Pt continuing to become more familiar with maximal daily exercises and is starting to implement exercises at home, he is aware he will do exercises twice a day, once in therapy and once at home on the days he has therapy and then twice at home on the days he doesn't have therapy such as the upcoming weekend.  No increased pain at bilateral knees after doing exercises this week but continues to report his "baseline" level of pain in knees as noted above.  Pt with tremor, RUE at times noted during arm and hand exercises.  Pt responding well to use of lined paper for handwriting this date with larger letter formation given parameters of the lines.  Pt requiring min assist for balance with balance tasks using cones today.  Continues to work towards improving amplitude of gait and stepping patterns to impact functional mobility.  Continue to work towards goals in plan of care, intensive portion of LSVT BIG to facilitate great amplitude of movement patterns, impact balance and improve functional daily tasks.   PERFORMANCE DEFICITS: in functional skills including ADLs, IADLs, coordination, ROM, strength, Fine motor control, and Gross motor control, cognitive skills including memory and problem solving, and psychosocial skills including coping strategies, environmental adaptation, habits, interpersonal interactions, and routines and behaviors.   IMPAIRMENTS: are limiting patient from ADLs, IADLs, and leisure.   CO-MORBIDITIES: may have co-morbidities  that affects occupational performance. Patient will benefit from skilled OT to address above impairments and improve overall function.  MODIFICATION OR ASSISTANCE TO COMPLETE EVALUATION: Min-Moderate modification of tasks or assist with assess necessary to complete an evaluation.  OT OCCUPATIONAL PROFILE AND HISTORY: Detailed assessment: Review of records and additional review of physical, cognitive, psychosocial  history related to current functional performance.  CLINICAL DECISION MAKING: Moderate - several treatment options, min-mod task modification necessary  REHAB POTENTIAL: Good  EVALUATION COMPLEXITY: Moderate    PLAN:  OT FREQUENCY: 4x/week  OT DURATION: 6 weeks  PLANNED INTERVENTIONS: self care/ADL training, therapeutic exercise, therapeutic activity, neuromuscular re-education, manual therapy, passive range of motion, functional mobility training, moist heat, patient/family education, cognitive remediation/compensation, and DME and/or AE instructions  RECOMMENDED OTHER SERVICES: SLP  CONSULTED AND AGREED WITH PLAN OF CARE: Patient and family member/caregiver  PLAN FOR NEXT SESSION: Initiate  treatment, perform the 6 min. walk test.  Achilles Dunk, OTR/L, CLT  Abdishakur Gottschall, OT 07/22/2022, 11:16 AM

## 2022-07-22 NOTE — Therapy (Signed)
OUTPATIENT SPEECH LANGUAGE PATHOLOGY TREATMENT NOTE   Patient Name: Neil Bailey MRN: [id number] DOB:05-11-1951, 72 y.o., male Today's Date: 07/22/2022  PCP: Emily Filbert, MD REFERRING PROVIDER: Jennings Books, MD  END OF SESSION:   End of Session - 07/22/22 0827     Visit Number 3    Number of Visits 17    Date for SLP Re-Evaluation 09/07/22    Authorization Type [employer] HMO/PPO    Progress Note Due on Visit 10    SLP Start Time 0800    SLP Stop Time  0900    SLP Time Calculation (min) 60 min    Activity Tolerance Patient tolerated treatment well             Past Medical History:  Diagnosis Date   Aortic atherosclerosis ([place]) 03/09/2020   Noted on abdominal CT   Atrial fibrillation ([place])    COVID-19 06/2019   DVT (deep venous thrombosis) (HCC)    Dyspnea    since covid dec 2020   Dysrhythmia    paroxysmal atrial fibrillation   HTN (hypertension)    Lab test positive for detection of COVID-19 virus 06/30/2019   Morbid obesity ([place])    Pedal edema    Renal cyst, acquired, right    Thrombocytopenia ([place])    Past Surgical History:  Procedure Laterality Date   COLONOSCOPY  7-8 years ago   [facility]   COLONOSCOPY WITH PROPOFOL N/A 08/26/2020   Procedure: COLONOSCOPY WITH PROPOFOL;  Surgeon: Toledo, Benay Pike, MD;  Location: [facility] END[facility]COPY;  Service: Gastroenterology;  Laterality: N/A;   CYSTOSCOPY Left 04/03/2020   Procedure: CYSTOSCOPY Birdena Crandall;  Surgeon: [name], MD;  Location: [facility] ORS;  Service: Urology;  Laterality: Left;   EYE SURGERY  05/2014, 06/2014   cataracts   HERNIA REPAIR     HYDROCELE EXCISION Right 08/07/2020   Procedure: HYDROCELECTOMY ADULT;  Surgeon: [name], MD;  Location: [facility] ORS;  Service: Urology;  Laterality: Right;   INGUINAL HERNIA REPAIR Right 01/19/2015   Procedure: RIGHT INCARCERATED INGUINAL HERNIA REPAIR;  Surgeon: Christene Lye, MD;  Location: [facility] ORS;  Service: General;  Laterality: Right;    TONSILLECTOMY     XI ROBOTIC ASSISTED INGUINAL HERNIA REPAIR WITH MESH Left 04/03/2020   Procedure: XI ROBOTIC ASSISTED INGUINAL HERNIA REPAIR WITH MESH, possible bilateral;  Surgeon: Jules Husbands, MD;  Location: [facility] ORS;  Service: General;  Laterality: Left;   Patient Active Problem List   Diagnosis Date Noted   SOB (shortness of breath) on exertion 06/23/2020   Pedal edema 06/08/2020   Renal cyst, acquired, right 06/08/2020   Thrombocytopenia ([place]) 02/11/2020   Acute deep vein thrombosis (DVT) of popliteal vein of right lower extremity ([place]) 07/18/2019   History of 2019 novel coronavirus disease (COVID-19) 07/18/2019   Acute respiratory failure with hypoxia ([place]) 07/04/2019   Atrial fibrillation, chronic ([place]) 07/04/2019   Pneumonia due to COVID-19 virus 07/01/2019   CAP (community acquired pneumonia) 06/30/2019   Medicare annual wellness visit, initial 03/20/2017   Morbid obesity with BMI of 40.0-44.9, adult ([place]) 12/29/2016   History of DVT (deep vein thrombosis) 10/17/2013    ONSET DATE: 05/05/2022  REFERRING DIAG: G20.A1 (ICD-10-CM) - Parkinson's disease    THERAPY DIAG:  Cognitive communication deficit   Dysarthria  THERAPY DIAG:  Cognitive communication deficit  Rationale for Evaluation and Treatment Rehabilitation  SUBJECTIVE: pt brought in his medicines and calendar  Pt accompanied by: self  PAIN:  Are you having pain?  No  PATIENT GOALS: improve his memory  OBJECTIVE:   TODAY'S TREATMENT:  Skilled treatment session focused on pt's cognitive communication goals. SLP facilitated the session by providing the following interventions:  SLP compiled a list of current medications, printing them, laminating them for his money clip as well as extra SLP made a second list of medicines and the medical   PATIENT EDUCATION: Education details: see above Person educated: Patient Education method: Explanation, Demonstration, and Verbal cues Education  comprehension: verbalized understanding  HOME EXERCISE PROGRAM:  Put list of medications in his money clip Purchase a calendar and put in bill due dates  GOALS: Goals reviewed with patient? Yes  SHORT TERM GOALS: Target date: 10 sessions   The patient will maximize voice quality and loudness using breath support for sustained vowel production, pitch glides, and hierarchal speech drill. Baseline: Goal status: INITIAL   2.   The patient will demonstrate abdominal breathing patterns and steady release of breath on exhalation to optimize efficiency of voicing and decrease laryngeal hyperfunction Baseline:  Goal status: INITIAL   3.  Patient will report improved communication effectiveness as measured by Communicative Effectiveness Survey.  Baseline:  Goal status: INITIAL   4.  Pt will use external aid for functional recall of completed/upcoming activities 75% accuracy with Min A.  Baseline:  Goal status: INITIAL   5.  Pt will use strategies to improve memory for important information with 75% acc. with minimal assistance (ie., white board, daily planner/calendar, Apps on phone).  Baseline:  Goal status: INITIAL   6.  Pt will complete mod complex money/financial problems 100% accuracy in a reasonable amount of time with double checking, use of strategies and rare min A. Baseline:  Goal status: INITIAL   LONG TERM GOALS: Target date: 09/07/2022 Pt will complete mod complex money/financial problems 100% accuracy in a reasonable amount of time with double checking, use of strategies Baseline:  Goal status: INITIAL   2.  Pt will use strategies to improve memory for important information with 75% acc. (ie., white board, daily planner/calendar, Apps on phone).  Baseline:  Goal status: INITIAL   3.  Pt will use external aid for functional recall of completed/upcoming activities > 90% accuracy Baseline:  Goal status: INITIAL   4.  Pt will read paragraph length material with rare  Min A cues for use of loud, good quality voice.  Baseline:  Goal status: INITIAL    ASSESSMENT:  CLINICAL IMPRESSION: Pt presents with what he describes as a "laid back attitude" what results in general disorganization. Will provide instruction in cognitive strategies but pt will need to choose to execute the strategies within his daily life.   OBJECTIVE IMPAIRMENTS include memory and dysarthria. These impairments are limiting patient from managing medications, managing appointments, managing finances, household responsibilities, ADLs/IADLs, and effectively communicating at home and in community. Factors affecting potential to achieve goals and functional outcome are ability to learn/carryover information, co-morbidities, and previous level of function. Patient will benefit from skilled SLP services to address above impairments and improve overall function.  REHAB POTENTIAL:   PLAN: SLP FREQUENCY: 2x/week  SLP DURATION: 8 weeks  PLANNED INTERVENTIONS: Environmental controls, Cognitive reorganization, Internal/external aids, Functional tasks, SLP instruction and feedback, Compensatory strategies, and Patient/family education  Garl Speigner B. Rutherford Nail, M.S., CCC-SLP, Mining engineer Certified Brain Injury [place]  [place] Office [phone] Ascom [phone] Fax [phone]

## 2022-07-25 ENCOUNTER — Encounter: Payer: Medicare HMO | Admitting: Speech Pathology

## 2022-07-25 ENCOUNTER — Ambulatory Visit: Payer: Medicare HMO | Admitting: Occupational Therapy

## 2022-07-25 DIAGNOSIS — R41841 Cognitive communication deficit: Secondary | ICD-10-CM | POA: Diagnosis not present

## 2022-07-25 DIAGNOSIS — M6281 Muscle weakness (generalized): Secondary | ICD-10-CM

## 2022-07-25 DIAGNOSIS — R278 Other lack of coordination: Secondary | ICD-10-CM

## 2022-07-25 NOTE — Therapy (Signed)
OUTPATIENT OCCUPATIONAL THERAPY NEURO/LSVT TREATMENT  Patient Name: Neil Bailey MRN: [id number] DOB:[birth year], 72 y.o., male Today's Date: 07/25/2022  PCP: Emily Filbert, MD REFERRING PROVIDER: Jennings Books, MD  END OF SESSION:  OT End of Session - 07/25/22 1015     Visit Number 6    Number of Visits 17    Date for OT Re-Evaluation 08/25/22    Authorization Time Period Progress report period starting 07/14/2022    OT Start Time 0902    OT Stop Time 1002    OT Time Calculation (min) 60 min    Activity Tolerance Patient tolerated treatment well    Behavior During Therapy [facility] for tasks assessed/performed             Past Medical History:  Diagnosis Date   Aortic atherosclerosis ([place]) 03/09/2020   Noted on abdominal CT   Atrial fibrillation ([place])    COVID-19 06/2019   DVT (deep venous thrombosis) ([facility])    Dyspnea    since covid dec 2020   Dysrhythmia    paroxysmal atrial fibrillation   HTN (hypertension)    Lab test positive for detection of COVID-19 virus 06/30/2019   Morbid obesity ([place])    Pedal edema    Renal cyst, acquired, right    Thrombocytopenia ([place])    Past Surgical History:  Procedure Laterality Date   COLONOSCOPY  7-8 years ago   [facility]   COLONOSCOPY WITH PROPOFOL N/A 08/26/2020   Procedure: COLONOSCOPY WITH PROPOFOL;  Surgeon: Toledo, Benay Pike, MD;  Location: [facility] ENDOSCOPY;  Service: Gastroenterology;  Laterality: N/A;   CYSTOSCOPY Left 04/03/2020   Procedure: CYSTOSCOPY Birdena Crandall;  Surgeon: [name], MD;  Location: [facility] ORS;  Service: Urology;  Laterality: Left;   EYE SURGERY  05/2014, 06/2014   cataracts   HERNIA REPAIR     HYDROCELE EXCISION Right 08/07/2020   Procedure: HYDROCELECTOMY ADULT;  Surgeon: [name], MD;  Location: [facility] ORS;  Service: Urology;  Laterality: Right;   INGUINAL HERNIA REPAIR Right 01/19/2015   Procedure: RIGHT INCARCERATED INGUINAL HERNIA REPAIR;  Surgeon: Christene Lye, MD;  Location: [facility]  ORS;  Service: General;  Laterality: Right;   TONSILLECTOMY     XI ROBOTIC ASSISTED INGUINAL HERNIA REPAIR WITH MESH Left 04/03/2020   Procedure: XI ROBOTIC ASSISTED INGUINAL HERNIA REPAIR WITH MESH, possible bilateral;  Surgeon: Jules Husbands, MD;  Location: [facility] ORS;  Service: General;  Laterality: Left;   Patient Active Problem List   Diagnosis Date Noted   SOB (shortness of breath) on exertion 06/23/2020   Pedal edema 06/08/2020   Renal cyst, acquired, right 06/08/2020   Thrombocytopenia ([place]) 02/11/2020   Acute deep vein thrombosis (DVT) of popliteal vein of right lower extremity ([place]) 07/18/2019   History of 2019 novel coronavirus disease (COVID-19) 07/18/2019   Acute respiratory failure with hypoxia ([place]) 07/04/2019   Atrial fibrillation, chronic ([place]) 07/04/2019   Pneumonia due to COVID-19 virus 07/01/2019   CAP (community acquired pneumonia) 06/30/2019   Medicare annual wellness visit, initial 03/20/2017   Morbid obesity with BMI of 40.0-44.9, adult ([place]) 12/29/2016   History of DVT (deep vein thrombosis) 10/17/2013    ONSET DATE: 12/09/2021  REFERRING DIAG: Parkinson's Disease  THERAPY DIAG:  Muscle weakness (generalized)  Other lack of coordination  Rationale for Evaluation and Treatment: Rehabilitation  SUBJECTIVE:   SUBJECTIVE STATEMENT:  Pt. reports that he did the exercises once a day over this past weekend.  Pt accompanied by: self  PERTINENT HISTORY:  Pt. is a 72 y.o. male who was diagnosed with Parkinson's Disease in June of 2023.   PRECAUTIONS: None  WEIGHT BEARING RESTRICTIONS: No  PAIN:  Are you having pain? Knee pain Left: 6/10, Right: 8-9/10  FALLS: Has patient fallen in last 6 months? No  LIVING ENVIRONMENT: Lives with: lives with their spouse Lives in: House/apartment Stairs: 3- 4 steps to enter. 2 story home Has following equipment at home: None  PLOF: Independent  PATIENT GOALS: Regain stability  OBJECTIVE:   HAND DOMINANCE:  Ambidextrous  ADLs: Overall ADLs:   Transfers/ambulation related to ADLs: Eating:  Tremors, uses a spoon instead of a fork Grooming: Difficulty aligning  toothpaste on the tooth brush, difficulty cutting toenails-Consistently goes to a nail salon to have them done.  UB Dressing:  Independent LB Dressing: Independent, increased time to complete Toileting: Independent Bathing: Independent Tub Shower transfers: Independent    IADLs: Shopping: Independent Light housekeeping: No changes noted  Meal Prep: Does not cook, grills Community mobility:  Driving Independently Medication management: Independent with weekly pillbox Financial management: No changes noted Handwriting: 50% legible Name in cursive writing  MOBILITY STATUS: Independent  POSTURE COMMENTS:   Sitting balance:  Good unsupported sitting balance  ACTIVITY TOLERANCE: Activity tolerance: Pt. requires rest breaks during standing assessments  FUNCTIONAL OUTCOME MEASURES:  FOTO: 48 5x's STS: 18 sec. TUG: 11 sec. Freezing of Gait: 9 BERG Balance Scale: 43/56  07/18/2022:   6 minute walk test: 890 ft. with a seated rest break required at the end.   UPPER EXTREMITY ROM:    Active ROM Right Eval WFL  Left Eval [facility]  Shoulder flexion    Shoulder abduction    Shoulder adduction    Shoulder extension    Shoulder internal rotation    Shoulder external rotation    Elbow flexion    Elbow extension    Wrist flexion    Wrist extension    Wrist ulnar deviation    Wrist radial deviation    Wrist pronation    Wrist supination    (Blank rows = not tested)  UPPER EXTREMITY MMT:     MMT Right Eval 5/5 Left Eval 5/5  Shoulder flexion    Shoulder abduction    Shoulder adduction    Shoulder extension    Shoulder internal rotation    Shoulder external rotation    Middle trapezius    Lower trapezius    Elbow flexion    Elbow extension    Wrist flexion    Wrist extension    Wrist ulnar deviation     Wrist radial deviation    Wrist pronation    Wrist supination    (Blank rows = not tested)  HAND FUNCTION: Grip strength: Right: 68 lbs; Left: 64 lbs and Lateral pinch: Right: 18 lbs, Left: 18 lbs 3pt. Pinch Right: 18 lbs, Left: 17 lbs  COORDINATION: 9 Hole Peg test: Right: 33 sec; Left: 27 sec  SENSATION: WFL  EDEMA: N/A  MUSCLE TONE:  WFL  COGNITION: Overall cognitive status: Pt. reports cognitive/memory changes. Pt. being seen by SLP  VISION: Subjective report:  Pt. reports no changes with vision.  VISION ASSESSMENT:  WFL for tasks assessed  PERCEPTION:  TBA  PRAXIS: Impaired: Motor planning  TODAY'S TREATMENT:  DATE: 07/25/2022    LSVT: Patient seen for LSVT Daily Session Maximal Daily Exercises for facilitation/coordination of movement Maximum Sustained Movements are designed to rescale the amplitude of movement output for generalization to daily functional activities. Performed as follows for 1 set of 10 repetitions each multi-directional sustained movements: 1) Floor to ceiling- Pt. Required cues to increase amplitude of extended reach up towards the ceiling. Bilateral finger flicks added to the count.  2) Side to side multidirectional- Pt. required gentle tactile assist and cues initially for increased hip, and knee extension.   3) Step and reach forward step- Pt. Required cues, and modeling for slight knee flexion when stepping forward with the advancing LE. Cues were required for upringht midline posture. 4) Step and reach sideways step- Pt. required verbal cues to increase right knee flexion when stepping to the left.  5) Step and reach backwards step- Improved with a larger steps back posteriorly.  Pt. Was able to complete the movements more fluidly. With no cuing required for Iifting of the toes. 6) Rock and reach forward/backward- Pt.  Was able to complete the movement today incorporating one arm while the opposite arm was stabilized on a chair for the duration of the exercises Pt. Required cues for upright posture. 7) Rock and reach sideways- Pt. continues to present with limited rotation through the trunk when turning to look over his shoulders. Pt. Required cues for UE extension, and to spread digits.    Functional Component Task:   1.Sit to stand BIG functional component task for 5 reps with visual visual demonstration, visual, and tactile cues. Pt. presents with limited forward flexion in anticipation for pushing through his legs to stand, and limited forward hip flexion when transitioning back to sitting. 2.Stepping over objects: Pt. worked on balance tasks alternating stepping onto designated cones while alternating high knees in between. Pt. used a chair for stability. Pt. Presents with difficulty performing, and controlling high knees between each rep. 3.Increasing Arm swing: Pt. worked on reps on increasing the amplitude of posterior arm swing using the wall for feedback. 4:Turning/Elongating the trunk 5: Formulating large letters for his signature  Pt. worked on bilateral pectoral stretches in standing at the wall followed by sitting. Pt. required cues for visual demonstration, and education about the importance of pectoral stretches in promoting upright midline posture with Parkinson's Disease.   BIG ambulation:     Pt. performed BIG Ambulation with emphasis placed on increasing amplitude of movements for arm swing, and large, long steps. Pt. was able to complete  a total of 255 ft. With 2 rest breaks required. Pt. Continues to present with difficulty coordinating UE's, and LE's with increased amplitude. The task was modified to BLE BIG marching for 85 ft. With cues for high knees, followed by focusing on arm swing only for 85 ft. with the use of a dowel for feedback of posterior arm swing.    Pt. presents with no pain  today, however Pt. reports always having baseline pain as reported above. Pt. continues to require each of the repetitive MDE's to be performed in the adapted version with a chair. Pt. continues to require consistent verbal cues, tactile cues, visual demonstration, and modeling of each exercise. Pt. required supervision during the repetitive MDE's, and continues to require CGA when attempting to increase the amplitude of the LE movements, and during BIG Ambulation. Pt. required cues for upright posture when standing and reaching. Pt. Was able to tolerate the addition of  bilateral hand flicks  to the floor to ceiling MDE. Pt. was able recall the next MDE in the sequence 50% of the time when asks. Pt. has improved with full digit extension. Pt. required tactile cues for LE position, UE alignment,  upright posture, and hand position. Pt.  Presented with difficulty performing, and controlling reps of alternating high knees prior to stepping on the cones. Pt. continues to benefit from OT services for the LSVT BIG program to specifically focus on increasing the amplitude of movements for improved UE functioning, balance, and safety during ADLs, and IADL tasks while reducing overall fall risk.        PATIENT EDUCATION: Education details: LSVT MDE's Person educated: Patient Education method: Explanation, Demonstration, Tactile cues, and Verbal cues Education comprehension: verbalized understanding  HOME EXERCISE PROGRAM:  To be determined    GOALS: Goals reviewed with patient? Yes  SHORT TERM GOALS: Target date: 08/04/2022    Pt. Will be independent with HEPs for Maximal Daily Exercises, including modifications. Baseline: Eval: No current HEP. Training to begin for the MDE on the next visit. Goal status: INITIAL    LONG TERM GOALS: Target date: 08/25/2022    Pt. Will be independent with the Established LSVT  Functional component, and Hierarchy tasks.  Baseline: Eval: Functional component tasks,  and hierarchy tasks to be established Goal status: INITIAL  2.  Pt. Will improve the BERG Balance score by 5 points to reduce fall risk during daily tasks. Baseline: Eval: BERG Balance score: 43/56 Goal status: INITIAL  3.  Pt. Will improve the Freezing of Gait score by 3 points.  Baseline: Eval: FOG score: 9 Goal status: INITIAL  4.  Pt. Increase the FOTO score by 3 points for Pt. perceived improvement with assessment specific ADLs, and IADL tasks.  Baseline: Eval: FOTO score: 48 Goal status: INITIAL  5.  Pt. Will write his name with 75% legibility in preparation for signing documents.  Baseline: Eval: Pt. signed his name with 50% legibility. Goal status: INITIAL    ASSESSMENT:  CLINICAL IMPRESSION:  Pt. presents with no pain today, however Pt. Reports always having baseline pain as reported above. Pt. continues to require each of the repetitive MDE's to be performed in the adapted version with a chair. Pt. continues to require consistent verbal cues, tactile cues, visual demonstration, and modeling of each exercise. Pt. required supervision during the repetitive MDE's, and continues to require CGA when attempting to increase the amplitude of the LE movements, and during BIG Ambulation. Pt. required cues for upright posture when standing and reaching. Pt. Was able to tolerate the addition of  bilateral hand flicks to the floor to ceiling MDE. Pt. was able recall the next MDE in the sequence 50% of the time when asks. Pt. has improved with full digit extension. Pt. required tactile cues for LE position, UE alignment,  upright posture, and hand position. Pt.  Presented with difficulty performing, and controlling reps of alternating high knees prior to stepping on the cones. Pt. continues to benefit from OT services for the LSVT BIG program to specifically focus on increasing the amplitude of movements for improved UE functioning, balance, and safety during ADLs, and IADL tasks while reducing  overall fall risk.       PERFORMANCE DEFICITS: in functional skills including ADLs, IADLs, coordination, ROM, strength, Fine motor control, and Gross motor control, cognitive skills including memory and problem solving, and psychosocial skills including coping strategies, environmental adaptation, habits, interpersonal interactions, and routines and behaviors.   IMPAIRMENTS: are  limiting patient from ADLs, IADLs, and leisure.   CO-MORBIDITIES: may have co-morbidities  that affects occupational performance. Patient will benefit from skilled OT to address above impairments and improve overall function.  MODIFICATION OR ASSISTANCE TO COMPLETE EVALUATION: Min-Moderate modification of tasks or assist with assess necessary to complete an evaluation.  OT OCCUPATIONAL PROFILE AND HISTORY: Detailed assessment: Review of records and additional review of physical, cognitive, psychosocial history related to current functional performance.  CLINICAL DECISION MAKING: Moderate - several treatment options, min-mod task modification necessary  REHAB POTENTIAL: Good  EVALUATION COMPLEXITY: Moderate    PLAN:  OT FREQUENCY: 4x/week  OT DURATION: 6 weeks  PLANNED INTERVENTIONS: self care/ADL training, therapeutic exercise, therapeutic activity, neuromuscular re-education, manual therapy, passive range of motion, functional mobility training, moist heat, patient/family education, cognitive remediation/compensation, and DME and/or AE instructions  RECOMMENDED OTHER SERVICES: SLP  CONSULTED AND AGREED WITH PLAN OF CARE: Patient and family member/caregiver  PLAN FOR NEXT SESSION: Initiate treatment, perform the 6 min. walk test.  Harrel Carina, MS, OTR/L   Harrel Carina, OT 07/25/2022, 10:55 AM

## 2022-07-26 ENCOUNTER — Ambulatory Visit: Payer: Medicare HMO | Admitting: Speech Pathology

## 2022-07-26 ENCOUNTER — Encounter: Payer: Self-pay | Admitting: Occupational Therapy

## 2022-07-26 ENCOUNTER — Ambulatory Visit: Payer: Medicare HMO | Admitting: Occupational Therapy

## 2022-07-26 DIAGNOSIS — R41841 Cognitive communication deficit: Secondary | ICD-10-CM | POA: Diagnosis not present

## 2022-07-26 DIAGNOSIS — R278 Other lack of coordination: Secondary | ICD-10-CM

## 2022-07-26 DIAGNOSIS — M6281 Muscle weakness (generalized): Secondary | ICD-10-CM

## 2022-07-26 NOTE — Therapy (Signed)
OUTPATIENT OCCUPATIONAL THERAPY NEURO/LSVT TREATMENT  Patient Name: Neil Bailey MRN: [id number] DOB:1950/10/18, 72 y.o., male Today's Date: 07/26/2022  PCP: Emily Filbert, MD REFERRING PROVIDER: Jennings Books, MD  END OF SESSION:  OT End of Session - 07/26/22 1013     Visit Number 7    Number of Visits 17    Date for OT Re-Evaluation 08/25/22    Authorization Time Period Progress report period starting 07/14/2022    OT Start Time 0900    OT Stop Time 1000    OT Time Calculation (min) 60 min    Activity Tolerance Patient tolerated treatment well    Behavior During Therapy [facility] for tasks assessed/performed             Past Medical History:  Diagnosis Date   Aortic atherosclerosis ([place]) 03/09/2020   Noted on abdominal CT   Atrial fibrillation ([place])    COVID-19 06/2019   DVT (deep venous thrombosis) ([facility])    Dyspnea    since covid dec 2020   Dysrhythmia    paroxysmal atrial fibrillation   HTN (hypertension)    Lab test positive for detection of COVID-19 virus 06/30/2019   Morbid obesity ([place])    Pedal edema    Renal cyst, acquired, right    Thrombocytopenia ([place])    Past Surgical History:  Procedure Laterality Date   COLONOSCOPY  7-8 years ago   [facility]   COLONOSCOPY WITH PROPOFOL N/A 08/26/2020   Procedure: COLONOSCOPY WITH PROPOFOL;  Surgeon: Toledo, Benay Pike, MD;  Location: [facility] ENDOSCOPY;  Service: Gastroenterology;  Laterality: N/A;   CYSTOSCOPY Left 04/03/2020   Procedure: CYSTOSCOPY Birdena Crandall;  Surgeon: [name], MD;  Location: [facility] ORS;  Service: Urology;  Laterality: Left;   EYE SURGERY  05/2014, 06/2014   cataracts   HERNIA REPAIR     HYDROCELE EXCISION Right 08/07/2020   Procedure: HYDROCELECTOMY ADULT;  Surgeon: [name], MD;  Location: [facility] ORS;  Service: Urology;  Laterality: Right;   INGUINAL HERNIA REPAIR Right 01/19/2015   Procedure: RIGHT INCARCERATED INGUINAL HERNIA REPAIR;  Surgeon: Christene Lye, MD;  Location: [facility]  ORS;  Service: General;  Laterality: Right;   TONSILLECTOMY     XI ROBOTIC ASSISTED INGUINAL HERNIA REPAIR WITH MESH Left 04/03/2020   Procedure: XI ROBOTIC ASSISTED INGUINAL HERNIA REPAIR WITH MESH, possible bilateral;  Surgeon: Jules Husbands, MD;  Location: [facility] ORS;  Service: General;  Laterality: Left;   Patient Active Problem List   Diagnosis Date Noted   SOB (shortness of breath) on exertion 06/23/2020   Pedal edema 06/08/2020   Renal cyst, acquired, right 06/08/2020   Thrombocytopenia ([place]) 02/11/2020   Acute deep vein thrombosis (DVT) of popliteal vein of right lower extremity ([place]) 07/18/2019   History of 2019 novel coronavirus disease (COVID-19) 07/18/2019   Acute respiratory failure with hypoxia ([place]) 07/04/2019   Atrial fibrillation, chronic ([place]) 07/04/2019   Pneumonia due to COVID-19 virus 07/01/2019   CAP (community acquired pneumonia) 06/30/2019   Medicare annual wellness visit, initial 03/20/2017   Morbid obesity with BMI of 40.0-44.9, adult ([place]) 12/29/2016   History of DVT (deep vein thrombosis) 10/17/2013    ONSET DATE: 12/09/2021  REFERRING DIAG: Parkinson's Disease  THERAPY DIAG:  Muscle weakness (generalized)  Other lack of coordination  Rationale for Evaluation and Treatment: Rehabilitation  SUBJECTIVE:   SUBJECTIVE STATEMENT:  Pt. reports that he did the exercises once a day over this past weekend.  Pt accompanied by: self  PERTINENT HISTORY:  Pt. is a 72 y.o. male who was diagnosed with Parkinson's Disease in June of 2023.   PRECAUTIONS: None  WEIGHT BEARING RESTRICTIONS: No  PAIN:  Are you having pain? Knee pain 5-6/10 right knee pain  FALLS: Has patient fallen in last 6 months? No  LIVING ENVIRONMENT: Lives with: lives with their spouse Lives in: House/apartment Stairs: 3- 4 steps to enter. 2 story home Has following equipment at home: None  PLOF: Independent  PATIENT GOALS: Regain stability  OBJECTIVE:   HAND DOMINANCE:  Ambidextrous  ADLs: Overall ADLs:   Transfers/ambulation related to ADLs: Eating:  Tremors, uses a spoon instead of a fork Grooming: Difficulty aligning  toothpaste on the tooth brush, difficulty cutting toenails-Consistently goes to a nail salon to have them done.  UB Dressing:  Independent LB Dressing: Independent, increased time to complete Toileting: Independent Bathing: Independent Tub Shower transfers: Independent    IADLs: Shopping: Independent Light housekeeping: No changes noted  Meal Prep: Does not cook, grills Community mobility:  Driving Independently Medication management: Independent with weekly pillbox Financial management: No changes noted Handwriting: 50% legible Name in cursive writing  MOBILITY STATUS: Independent  POSTURE COMMENTS:   Sitting balance:  Good unsupported sitting balance  ACTIVITY TOLERANCE: Activity tolerance: Pt. requires rest breaks during standing assessments  FUNCTIONAL OUTCOME MEASURES:  FOTO: 48 5x's STS: 18 sec. TUG: 11 sec. Freezing of Gait: 9 BERG Balance Scale: 43/56  07/18/2022:   6 minute walk test: 890 ft. with a seated rest break required at the end.   UPPER EXTREMITY ROM:    Active ROM Right Eval WFL  Left Eval [facility]  Shoulder flexion    Shoulder abduction    Shoulder adduction    Shoulder extension    Shoulder internal rotation    Shoulder external rotation    Elbow flexion    Elbow extension    Wrist flexion    Wrist extension    Wrist ulnar deviation    Wrist radial deviation    Wrist pronation    Wrist supination    (Blank rows = not tested)  UPPER EXTREMITY MMT:     MMT Right Eval 5/5 Left Eval 5/5  Shoulder flexion    Shoulder abduction    Shoulder adduction    Shoulder extension    Shoulder internal rotation    Shoulder external rotation    Middle trapezius    Lower trapezius    Elbow flexion    Elbow extension    Wrist flexion    Wrist extension    Wrist ulnar deviation     Wrist radial deviation    Wrist pronation    Wrist supination    (Blank rows = not tested)  HAND FUNCTION: Grip strength: Right: 68 lbs; Left: 64 lbs and Lateral pinch: Right: 18 lbs, Left: 18 lbs 3pt. Pinch Right: 18 lbs, Left: 17 lbs  COORDINATION: 9 Hole Peg test: Right: 33 sec; Left: 27 sec  SENSATION: WFL  EDEMA: N/A  MUSCLE TONE:  WFL  COGNITION: Overall cognitive status: Pt. reports cognitive/memory changes. Pt. being seen by SLP  VISION: Subjective report:  Pt. reports no changes with vision.  VISION ASSESSMENT:  WFL for tasks assessed  PERCEPTION:  TBA  PRAXIS: Impaired: Motor planning  TODAY'S TREATMENT:  DATE: 07/25/2022    LSVT: Patient seen for LSVT Daily Session Maximal Daily Exercises for facilitation/coordination of movement Maximum Sustained Movements are designed to rescale the amplitude of movement output for generalization to daily functional activities. Performed as follows for 1 set of 10 repetitions each multi-directional sustained movements: 1) Floor to ceiling- Fewer cues were required for form. Cues were required for opening the hand fully between each finger flick.  2) Side to side multidirectional- Pt. required verbal cues only initially for increased hip, and knee extension. No tactile cues needed for hip, and knee flexion. 3) Step and reach forward step- Cues were required for upringht midline posture. 4) Step and reach sideways step- Pt. required verbal cues to increase right knee flexion when stepping to the left.  5) Step and reach backwards step- Improved with a larger steps back posteriorly.  Pt. Was able to complete the movements more fluidly. With no cuing required for Iifting of the toes. 6) Rock and reach forward/backward- Pt. was able to complete the movement today incorporating one arm while the opposite arm  was stabilized on a chair for the duration of the exercises Pt. required cues for upright posture. 7) Rock and reach sideways- Pt. continues to present with limited rotation through the trunk when turning to look over his shoulders. Pt. Required cues for UE extension, and to spread digits.    Functional Component Task:   1.Sit to stand BIG functional component task for 5 reps with visual visual demonstration, visual, and tactile cues. Pt. was able to perform the functional component task more fluidly from start to finish. Verbal cues, and visual cues were required for posture, and hand position. 2.Stepping over objects: Pt. worked on stepping over a wide flat half floor bolster, with high knees, and BIG steps/marching with turning. Pt. Used a chair for support. 3.Increasing Arm swing: Pt. worked on reps on increasing the amplitude of posterior arm swing using the wall for feedback. 4:Turning/Elongating the trunk 5: Formulating large letters for his signature: Pt. worked on large/BIG strokes with increased amplitude when writing words, and sentences in printed, and cursive form.   Pt. worked on bilateral pectoral stretches in standing at the wall followed by sitting. Pt. required cues for visual demonstration, and education about the importance of pectoral stretches in promoting upright midline posture with Parkinson's Disease.   BIG ambulation:     Pt. performed BIG Ambulation with emphasis placed on increasing amplitude of movements for arm swing, and large, long steps. Pt. was able to complete  a total of 357 ft. Pt. worked on [employer], and was able to go 179 ft. feet incorporating both UE arm swing, and lunging/long stride movements with the LEs, with increase amplitude.  Pt. then worked on 178 ft for walking with emphasis placed on the  arm swing only with feedback from the dowel.     Pt. presents with 5-6/10 pain in then right knee.Pt. continues to require each of the repetitive MDE's to  be performed in the adapted version with a chair. Pt. continues to require consistent verbal cues, tactile cues, visual demonstration, and modeling of each exercise. Pt. required supervision during the repetitive MDE's, and continues to require CGA when attempting to increase the amplitude of the LE movements, and during BIG Ambulation. Pt. required cues for upright posture when standing and reaching. Pt. was able to tolerate the addition of  bilateral hand flicks to the floor to ceiling MDE. Pt. was able recall the next MDE  in the sequence 75% of the time when asked. Pt. has improved with full digit extension, however continues to require verbal cues, visual cues, and modeling of the correct hand position during the exercises.  Pt. required tactile cues for LE position, UE alignment,  upright posture, and hand position. Pt.  Presented with difficulty performing, and controlling reps of alternating high knees prior to stepping on the cones. Pt. continues to benefit from OT services for the LSVT BIG program to specifically focus on increasing the amplitude of movements for improved UE functioning, balance, and safety during ADLs, and IADL tasks while reducing overall fall risk.        PATIENT EDUCATION: Education details: LSVT MDE's Person educated: Patient Education method: Explanation, Demonstration, Tactile cues, and Verbal cues Education comprehension: verbalized understanding  HOME EXERCISE PROGRAM:  To be determined    GOALS: Goals reviewed with patient? Yes  SHORT TERM GOALS: Target date: 08/04/2022    Pt. Will be independent with HEPs for Maximal Daily Exercises, including modifications. Baseline: Eval: No current HEP. Training to begin for the MDE on the next visit. Goal status: INITIAL    LONG TERM GOALS: Target date: 08/25/2022    Pt. Will be independent with the Established LSVT  Functional component, and Hierarchy tasks.  Baseline: Eval: Functional component tasks, and  hierarchy tasks to be established Goal status: INITIAL  2.  Pt. Will improve the BERG Balance score by 5 points to reduce fall risk during daily tasks. Baseline: Eval: BERG Balance score: 43/56 Goal status: INITIAL  3.  Pt. Will improve the Freezing of Gait score by 3 points.  Baseline: Eval: FOG score: 9 Goal status: INITIAL  4.  Pt. Increase the FOTO score by 3 points for Pt. perceived improvement with assessment specific ADLs, and IADL tasks.  Baseline: Eval: FOTO score: 48 Goal status: INITIAL  5.  Pt. Will write his name with 75% legibility in preparation for signing documents.  Baseline: Eval: Pt. signed his name with 50% legibility. Goal status: INITIAL    ASSESSMENT:  CLINICAL IMPRESSION:  Pt. presents with 5-6/10 pain in then right knee.Pt. continues to require each of the repetitive MDE's to be performed in the adapted version with a chair. Pt. continues to require consistent verbal cues, tactile cues, visual demonstration, and modeling of each exercise. Pt. required supervision during the repetitive MDE's, and continues to require CGA when attempting to increase the amplitude of the LE movements, and during BIG Ambulation. Pt. required cues for upright posture when standing and reaching. Pt. was able to tolerate the addition of  bilateral hand flicks to the floor to ceiling MDE. Pt. was able recall the next MDE in the sequence 75% of the time when asked. Pt. has improved with full digit extension, however continues to require verbal cues, visual cues, and modeling of the correct hand position during the exercises.  Pt. required tactile cues for LE position, UE alignment,  upright posture, and hand position. Pt.  Presented with difficulty performing, and controlling reps of alternating high knees prior to stepping on the cones. Pt. continues to benefit from OT services for the LSVT BIG program to specifically focus on increasing the amplitude of movements for improved UE  functioning, balance, and safety during ADLs, and IADL tasks while reducing overall fall risk.      PERFORMANCE DEFICITS: in functional skills including ADLs, IADLs, coordination, ROM, strength, Fine motor control, and Gross motor control, cognitive skills including memory and problem solving, and psychosocial skills  including coping strategies, environmental adaptation, habits, interpersonal interactions, and routines and behaviors.   IMPAIRMENTS: are limiting patient from ADLs, IADLs, and leisure.   CO-MORBIDITIES: may have co-morbidities  that affects occupational performance. Patient will benefit from skilled OT to address above impairments and improve overall function.  MODIFICATION OR ASSISTANCE TO COMPLETE EVALUATION: Min-Moderate modification of tasks or assist with assess necessary to complete an evaluation.  OT OCCUPATIONAL PROFILE AND HISTORY: Detailed assessment: Review of records and additional review of physical, cognitive, psychosocial history related to current functional performance.  CLINICAL DECISION MAKING: Moderate - several treatment options, min-mod task modification necessary  REHAB POTENTIAL: Good  EVALUATION COMPLEXITY: Moderate    PLAN:  OT FREQUENCY: 4x/week  OT DURATION: 6 weeks  PLANNED INTERVENTIONS: self care/ADL training, therapeutic exercise, therapeutic activity, neuromuscular re-education, manual therapy, passive range of motion, functional mobility training, moist heat, patient/family education, cognitive remediation/compensation, and DME and/or AE instructions  RECOMMENDED OTHER SERVICES: SLP  CONSULTED AND AGREED WITH PLAN OF CARE: Patient and family member/caregiver  PLAN FOR NEXT SESSION: Initiate treatment, perform the 6 min. walk test.  Harrel Carina, MS, OTR/L   Harrel Carina, OT 07/26/2022, 10:47 AM

## 2022-07-26 NOTE — Therapy (Signed)
OUTPATIENT SPEECH LANGUAGE PATHOLOGY TREATMENT NOTE   Patient Name: Neil Bailey MRN: [id number] DOB:29-Nov-1950, 72 y.o., male Today's Date: 07/26/2022   PCP: Emily Filbert, MD REFERRING PROVIDER: Jennings Books, MD  END OF SESSION:   End of Session - 07/26/22 2008     Visit Number 4    Number of Visits 17    Date for SLP Re-Evaluation 09/07/22    Authorization Type [employer] HMO/PPO    Progress Note Due on Visit 10    SLP Start Time 0800    SLP Stop Time  0900    SLP Time Calculation (min) 60 min    Activity Tolerance Patient tolerated treatment well               Past Medical History:  Diagnosis Date   Aortic atherosclerosis ([place]) 03/09/2020   Noted on abdominal CT   Atrial fibrillation ([place])    COVID-19 06/2019   DVT (deep venous thrombosis) ([facility])    Dyspnea    since covid dec 2020   Dysrhythmia    paroxysmal atrial fibrillation   HTN (hypertension)    Lab test positive for detection of COVID-19 virus 06/30/2019   Morbid obesity ([place])    Pedal edema    Renal cyst, acquired, right    Thrombocytopenia ([place])    Past Surgical History:  Procedure Laterality Date   COLONOSCOPY  7-8 years ago   [facility]   COLONOSCOPY WITH PROPOFOL N/A 08/26/2020   Procedure: COLONOSCOPY WITH PROPOFOL;  Surgeon: Toledo, Benay Pike, MD;  Location: [facility] ENDOSCOPY;  Service: Gastroenterology;  Laterality: N/A;   CYSTOSCOPY Left 04/03/2020   Procedure: CYSTOSCOPY Birdena Crandall;  Surgeon: [name], MD;  Location: [facility] ORS;  Service: Urology;  Laterality: Left;   EYE SURGERY  05/2014, 06/2014   cataracts   HERNIA REPAIR     HYDROCELE EXCISION Right 08/07/2020   Procedure: HYDROCELECTOMY ADULT;  Surgeon: [name], MD;  Location: [facility] ORS;  Service: Urology;  Laterality: Right;   INGUINAL HERNIA REPAIR Right 01/19/2015   Procedure: RIGHT INCARCERATED INGUINAL HERNIA REPAIR;  Surgeon: Christene Lye, MD;  Location: [facility] ORS;  Service: General;  Laterality: Right;    TONSILLECTOMY     XI ROBOTIC ASSISTED INGUINAL HERNIA REPAIR WITH MESH Left 04/03/2020   Procedure: XI ROBOTIC ASSISTED INGUINAL HERNIA REPAIR WITH MESH, possible bilateral;  Surgeon: Jules Husbands, MD;  Location: [facility] ORS;  Service: General;  Laterality: Left;   Patient Active Problem List   Diagnosis Date Noted   SOB (shortness of breath) on exertion 06/23/2020   Pedal edema 06/08/2020   Renal cyst, acquired, right 06/08/2020   Thrombocytopenia ([place]) 02/11/2020   Acute deep vein thrombosis (DVT) of popliteal vein of right lower extremity ([place]) 07/18/2019   History of 2019 novel coronavirus disease (COVID-19) 07/18/2019   Acute respiratory failure with hypoxia ([place]) 07/04/2019   Atrial fibrillation, chronic ([place]) 07/04/2019   Pneumonia due to COVID-19 virus 07/01/2019   CAP (community acquired pneumonia) 06/30/2019   Medicare annual wellness visit, initial 03/20/2017   Morbid obesity with BMI of 40.0-44.9, adult ([place]) 12/29/2016   History of DVT (deep vein thrombosis) 10/17/2013    ONSET DATE: 05/05/2022  REFERRING DIAG: G20.A1 (ICD-10-CM) - Parkinson's disease    THERAPY DIAG:  Cognitive communication deficit   Dysarthria  THERAPY DIAG:  Cognitive communication deficit  Rationale for Evaluation and Treatment Rehabilitation  SUBJECTIVE: pt hadn't bought a calendar yet  Pt accompanied by: self  PAIN:  Are you  having pain? No  PATIENT GOALS: improve his memory  OBJECTIVE:   TODAY'S TREATMENT:  Skilled treatment session focused on pt's cognitive communication goals. SLP facilitated the session by providing the following interventions:  SLP provided verbal instruction/education as well as handout on the following memory strategies:  Memory Strategies  W - Write it down  A - Associate it with something  R - Repeat it  M - Mental Image     Play the memory game  Try to remember 3-5 items on your store list without looking  Study a detailed picture in a  magazine for 1 minute, then write down everything you can remember from the picture  Memory Compensation Strategies  Use "WARM" strategy. W= write it down A=  associate it R=  repeat it M=  make a mental picture  You can keep a Memory Notebook. Use a 3-ring notebook with sections for the following:  calendar, important names and phone numbers, medications, doctors' names/phone numbers, "to do list"/reminders, and a section to journal what you did each day  Use a calendar to write appointments down.  Write yourself a schedule for the day.  This can be placed on the calendar or in a separate section of the Memory Notebook.  Keeping a regular schedule can help memory.  Use medication organizer with sections for each day or morning/evening pills  You may need help loading it  Keep a basket, or pegboard by the door.   Place items that you need to take out with you in the basket or on the pegboard.  You may also want to include a message board for reminders.  Use sticky notes. Place sticky notes with reminders in a place where the task is performed.  For example:  "turn off the stove" placed by the stove, "lock the door" placed on the door at eye level, "take your medications" on the bathroom mirror or by the place where you normally take your medications  Use alarms, timers, and/or a reminder app. Use while cooking to remind yourself to check on food or as a reminder to take your medicine, or as a reminder to make a call, or as a reminder to perform another task, etc.  Use a voice recorder app or small tape recorder to record important information and notes for yourself. Go back at the end of the day and listen to these.   Pt instructed on using Use cell phone for pictures of household objects to remember to which pt states that he has already started using this strategy  Introduced mental manipulation activity Rare supervision cues to achieve > 95%  Introduced CHUNKING as a Engineer, civil (consulting)  HELP for Memory: Using Aids to Remember Task B: Insurance claims handler Pegs [id number] with pt achieving 100% independently  Provided functional illustrations of chunking grocery store lists, errands    PATIENT EDUCATION: Education details: see above Person educated: Patient Education method: Consulting civil engineer, Demonstration, and Verbal cues Education comprehension: verbalized understanding  HOME EXERCISE PROGRAM:  Put list of medications in his money clip Purchase a calendar and put in bill due dates  GOALS: Goals reviewed with patient? Yes  SHORT TERM GOALS: Target date: 10 sessions   The patient will maximize voice quality and loudness using breath support for sustained vowel production, pitch glides, and hierarchal speech drill. Baseline: Goal status: INITIAL   2.   The patient will demonstrate abdominal breathing patterns and steady release of breath on exhalation to optimize  efficiency of voicing and decrease laryngeal hyperfunction Baseline:  Goal status: INITIAL   3.  Patient will report improved communication effectiveness as measured by Communicative Effectiveness Survey.  Baseline:  Goal status: INITIAL   4.  Pt will use external aid for functional recall of completed/upcoming activities 75% accuracy with Min A.  Baseline:  Goal status: INITIAL   5.  Pt will use strategies to improve memory for important information with 75% acc. with minimal assistance (ie., white board, daily planner/calendar, Apps on phone).  Baseline:  Goal status: INITIAL   6.  Pt will complete mod complex money/financial problems 100% accuracy in a reasonable amount of time with double checking, use of strategies and rare min A. Baseline:  Goal status: INITIAL   LONG TERM GOALS: Target date: 09/07/2022 Pt will complete mod complex money/financial problems 100% accuracy in a reasonable amount of time with double checking, use of strategies Baseline:  Goal status: INITIAL    2.  Pt will use strategies to improve memory for important information with 75% acc. (ie., white board, daily planner/calendar, Apps on phone).  Baseline:  Goal status: INITIAL   3.  Pt will use external aid for functional recall of completed/upcoming activities > 90% accuracy Baseline:  Goal status: INITIAL   4.  Pt will read paragraph length material with rare Min A cues for use of loud, good quality voice.  Baseline:  Goal status: INITIAL    ASSESSMENT:  CLINICAL IMPRESSION:  Pt continues to eagerly participate in ST sessions but uncertain if pt will be motivated to use strategies during functional daily activities.   OBJECTIVE IMPAIRMENTS include memory and dysarthria. These impairments are limiting patient from managing medications, managing appointments, managing finances, household responsibilities, ADLs/IADLs, and effectively communicating at home and in community. Factors affecting potential to achieve goals and functional outcome are ability to learn/carryover information, co-morbidities, and previous level of function. Patient will benefit from skilled SLP services to address above impairments and improve overall function.  REHAB POTENTIAL:   PLAN: SLP FREQUENCY: 2x/week  SLP DURATION: 8 weeks  PLANNED INTERVENTIONS: Environmental controls, Cognitive reorganization, Internal/external aids, Functional tasks, SLP instruction and feedback, Compensatory strategies, and Patient/family education  Heyden Jaber B. Rutherford Nail, M.S., CCC-SLP, Mining engineer Certified Brain Injury [place]  [place] Office [phone] Ascom [phone] Fax [phone]

## 2022-07-27 ENCOUNTER — Ambulatory Visit: Payer: Medicare HMO | Admitting: Occupational Therapy

## 2022-07-27 ENCOUNTER — Ambulatory Visit: Payer: Medicare HMO | Admitting: Speech Pathology

## 2022-07-27 DIAGNOSIS — R41841 Cognitive communication deficit: Secondary | ICD-10-CM

## 2022-07-27 DIAGNOSIS — R278 Other lack of coordination: Secondary | ICD-10-CM

## 2022-07-27 DIAGNOSIS — M6281 Muscle weakness (generalized): Secondary | ICD-10-CM

## 2022-07-27 NOTE — Therapy (Addendum)
OUTPATIENT OCCUPATIONAL THERAPY NEURO/LSVT TREATMENT  Patient Name: Neil Bailey MRN: [id number] DOB:August 20, 1950, 72 y.o., male Today's Date: 07/27/2022  PCP: Emily Filbert, MD REFERRING PROVIDER: Jennings Books, MD  END OF SESSION:  OT End of Session - 07/27/22 1009     Visit Number 8    Number of Visits 17    Date for OT Re-Evaluation 08/25/22    Authorization Time Period Progress report period starting 07/14/2022    OT Start Time 0855    OT Stop Time 0955    OT Time Calculation (min) 60 min    Activity Tolerance Patient tolerated treatment well    Behavior During Therapy [facility] for tasks assessed/performed             Past Medical History:  Diagnosis Date   Aortic atherosclerosis ([place]) 03/09/2020   Noted on abdominal CT   Atrial fibrillation ([place])    COVID-19 06/2019   DVT (deep venous thrombosis) (HCC)    Dyspnea    since covid dec 2020   Dysrhythmia    paroxysmal atrial fibrillation   HTN (hypertension)    Lab test positive for detection of COVID-19 virus 06/30/2019   Morbid obesity ([place])    Pedal edema    Renal cyst, acquired, right    Thrombocytopenia ([place])    Past Surgical History:  Procedure Laterality Date   COLONOSCOPY  7-8 years ago   [facility]   COLONOSCOPY WITH PROPOFOL N/A 08/26/2020   Procedure: COLONOSCOPY WITH PROPOFOL;  Surgeon: Toledo, Benay Pike, MD;  Location: [facility] ENDOSCOPY;  Service: Gastroenterology;  Laterality: N/A;   CYSTOSCOPY Left 04/03/2020   Procedure: CYSTOSCOPY Birdena Crandall;  Surgeon: [name], MD;  Location: [facility] ORS;  Service: Urology;  Laterality: Left;   EYE SURGERY  05/2014, 06/2014   cataracts   HERNIA REPAIR     HYDROCELE EXCISION Right 08/07/2020   Procedure: HYDROCELECTOMY ADULT;  Surgeon: [name], MD;  Location: [facility] ORS;  Service: Urology;  Laterality: Right;   INGUINAL HERNIA REPAIR Right 01/19/2015   Procedure: RIGHT INCARCERATED INGUINAL HERNIA REPAIR;  Surgeon: Christene Lye, MD;  Location: [facility]  ORS;  Service: General;  Laterality: Right;   TONSILLECTOMY     XI ROBOTIC ASSISTED INGUINAL HERNIA REPAIR WITH MESH Left 04/03/2020   Procedure: XI ROBOTIC ASSISTED INGUINAL HERNIA REPAIR WITH MESH, possible bilateral;  Surgeon: Jules Husbands, MD;  Location: [facility] ORS;  Service: General;  Laterality: Left;   Patient Active Problem List   Diagnosis Date Noted   SOB (shortness of breath) on exertion 06/23/2020   Pedal edema 06/08/2020   Renal cyst, acquired, right 06/08/2020   Thrombocytopenia ([place]) 02/11/2020   Acute deep vein thrombosis (DVT) of popliteal vein of right lower extremity ([place]) 07/18/2019   History of 2019 novel coronavirus disease (COVID-19) 07/18/2019   Acute respiratory failure with hypoxia ([place]) 07/04/2019   Atrial fibrillation, chronic ([place]) 07/04/2019   Pneumonia due to COVID-19 virus 07/01/2019   CAP (community acquired pneumonia) 06/30/2019   Medicare annual wellness visit, initial 03/20/2017   Morbid obesity with BMI of 40.0-44.9, adult ([place]) 12/29/2016   History of DVT (deep vein thrombosis) 10/17/2013    ONSET DATE: 12/09/2021  REFERRING DIAG: Parkinson's Disease  THERAPY DIAG:  Muscle weakness (generalized)  Other lack of coordination  Rationale for Evaluation and Treatment: Rehabilitation  SUBJECTIVE:   SUBJECTIVE STATEMENT:  Pt. reports that he is trying to add the exercises.  Pt accompanied by: self  PERTINENT HISTORY:  Pt. is a 72  y.o. male who was diagnosed with Parkinson's Disease in June of 2023.   PRECAUTIONS: None  WEIGHT BEARING RESTRICTIONS: No  PAIN:  Are you having pain? Knee pain 5-6/10 right knee pain  FALLS: Has patient fallen in last 6 months? No  LIVING ENVIRONMENT: Lives with: lives with their spouse Lives in: House/apartment Stairs: 3- 4 steps to enter. 2 story home Has following equipment at home: None  PLOF: Independent  PATIENT GOALS: Regain stability  OBJECTIVE:   HAND DOMINANCE:  Ambidextrous  ADLs: Overall ADLs:   Transfers/ambulation related to ADLs: Eating:  Tremors, uses a spoon instead of a fork Grooming: Difficulty aligning  toothpaste on the tooth brush, difficulty cutting toenails-Consistently goes to a nail salon to have them done.  UB Dressing:  Independent LB Dressing: Independent, increased time to complete Toileting: Independent Bathing: Independent Tub Shower transfers: Independent    IADLs: Shopping: Independent Light housekeeping: No changes noted  Meal Prep: Does not cook, grills Community mobility:  Driving Independently Medication management: Independent with weekly pillbox Financial management: No changes noted Handwriting: 50% legible Name in cursive writing  MOBILITY STATUS: Independent  POSTURE COMMENTS:   Sitting balance:  Good unsupported sitting balance  ACTIVITY TOLERANCE: Activity tolerance: Pt. requires rest breaks during standing assessments  FUNCTIONAL OUTCOME MEASURES:  FOTO: 48 5x's STS: 18 sec. TUG: 11 sec. Freezing of Gait: 9 BERG Balance Scale: 43/56  07/18/2022:   6 minute walk test: 890 ft. with a seated rest break required at the end.   UPPER EXTREMITY ROM:    Active ROM Right Eval WFL  Left Eval [facility]  Shoulder flexion    Shoulder abduction    Shoulder adduction    Shoulder extension    Shoulder internal rotation    Shoulder external rotation    Elbow flexion    Elbow extension    Wrist flexion    Wrist extension    Wrist ulnar deviation    Wrist radial deviation    Wrist pronation    Wrist supination    (Blank rows = not tested)  UPPER EXTREMITY MMT:     MMT Right Eval 5/5 Left Eval 5/5  Shoulder flexion    Shoulder abduction    Shoulder adduction    Shoulder extension    Shoulder internal rotation    Shoulder external rotation    Middle trapezius    Lower trapezius    Elbow flexion    Elbow extension    Wrist flexion    Wrist extension    Wrist ulnar deviation     Wrist radial deviation    Wrist pronation    Wrist supination    (Blank rows = not tested)  HAND FUNCTION: Grip strength: Right: 68 lbs; Left: 64 lbs and Lateral pinch: Right: 18 lbs, Left: 18 lbs 3pt. Pinch Right: 18 lbs, Left: 17 lbs  COORDINATION: 9 Hole Peg test: Right: 33 sec; Left: 27 sec  SENSATION: WFL  EDEMA: N/A  MUSCLE TONE:  WFL  COGNITION: Overall cognitive status: Pt. reports cognitive/memory changes. Pt. being seen by SLP  VISION: Subjective report:  Pt. reports no changes with vision.  VISION ASSESSMENT:  WFL for tasks assessed  PERCEPTION:  TBA  PRAXIS: Impaired: Motor planning  TODAY'S TREATMENT:  DATE: 07/25/2022    LSVT: Patient seen for LSVT Daily Session Maximal Daily Exercises for facilitation/coordination of movement Maximum Sustained Movements are designed to rescale the amplitude of movement output for generalization to daily functional activities. Performed as follows for 1 set of 10 repetitions each multi-directional sustained movements: 1) Floor to ceiling- Fewer cues were required for form. Cues were required for opening the hand fully between each finger flick.  2) Side to side multidirectional- Pt. required fewer cues for increased hip, and knee extension. No tactile cues needed for hip, and knee flexion. Finger flicks were added to the exercises 1/2 way through the count from 6-10 for each the right and left sides. 3) Step and reach forward step- Cues were required for upright midline posture.The exercise was performed in the standard version without the chair. 4) Step and reach sideways step- Pt. required  fewer verbal cues to increase right knee flexion when stepping to the left. This exercise was performed in the standard version without the chair 5) Step and reach backwards step- Improved with a larger steps back  posteriorly.  Pt. Was able to complete the movements more fluidly. With no cuing required for Iifting of the toes. 6) Rock and reach forward/backward- Pt. was able to complete the movement today incorporating one arm while the opposite arm was stabilized on a chair for the duration of the exercises Pt. required cues for upright posture. This exercise was performed in the standard version without a chair. Pt. required cues to coordinate the bilateral UE movements. 7) Rock and reach sideways- Pt. continues to present with limited rotation through the trunk when turning to look over his shoulders. Pt. required cues for UE extension, and to spread digits. Pt. Performed the task in the standard version without the chair.    Functional Component Task:   1.Sit to stand BIG functional component task for 5 reps with visual visual demonstration, visual, and tactile cues. Pt. was able to perform the functional component task more fluidly from start to finish. Verbal cues, and visual cues were required for posture, and hand position. 2.Stepping over objects:  3.Increasing Arm swing: Pt. worked on reps on increasing the amplitude of posterior arm swing using the wall for feedback. Pt. worked on increasing then amplitude of the arm swing posteriorly using bilateral dowels for feedback. 4:Turning/Elongating the trunk: Pt. worked on controlled turning, and trunk rotation transferring balls from one basket on the left. Pt. Worked on controled turning to each side. 5: Formulating large letters for his signature: Pt. worked on large/BIG strokes on a vertical white board with promote increased wrist extension.  Pt. worked on bilateral pectoral stretches in standing at the wall followed by sitting. Pt. required cues for visual demonstration, and education about the importance of pectoral stretches in promoting upright midline posture with Parkinson's Disease.   BIG ambulation:     Pt. performed BIG Ambulation with  emphasis placed on increasing amplitude of movements for arm swing, and large, long steps. Pt. was able to complete  a total of 357 ft. Pt. worked on [employer] the whole distance with emphasis placed on armswing, and large steps, and marching with high knees.     Pt. presents with no reports of pain during the session, however Pt. has pre-existing bilateral knee pain as per above. Pt. was able to transition to performing the forward step and reach, sideways step and reach, forward rock and reach, and sideways rock and reach in the standard version without  the chair. Pt. Continues to required the backwards step and reach in the adapted version with the chair. Pt. required increased cues to coordination bilateral UE movements during the MDE's. Pt. continues to require consistent verbal cues, tactile cues, visual demonstration, and modeling of each exercise. Pt. required supervision during the repetitive MDE's, and continues to require CGA when attempting to increase the amplitude of the LE movements, and during BIG Ambulation. Pt. Continues to require cues for upright posture when standing and reaching. Pt. was able to tolerate the addition of  bilateral hand flicks to the seated side to side MDE. Pt. was able recall each step in the sequence of MDE exercises. Pt. required verbal cues, visual cues, and modeling of the correct hand position during the exercises.  Pt. required tactile cues for LE position, UE alignment,  upright posture, and hand position. Pt. continues to benefit from OT services for the LSVT BIG program to specifically focus on increasing the amplitude of movements for improved UE functioning, balance, and safety during ADLs, and IADL tasks while reducing overall fall risk.      PATIENT EDUCATION: Education details: LSVT MDE's Person educated: Patient Education method: Explanation, Demonstration, Tactile cues, and Verbal cues Education comprehension: verbalized understanding  HOME  EXERCISE PROGRAM:  To be determined    GOALS: Goals reviewed with patient? Yes  SHORT TERM GOALS: Target date: 08/04/2022    Pt. Will be independent with HEPs for Maximal Daily Exercises, including modifications. Baseline: Eval: No current HEP. Training to begin for the MDE on the next visit. Goal status: INITIAL    LONG TERM GOALS: Target date: 08/25/2022    Pt. Will be independent with the Established LSVT  Functional component, and Hierarchy tasks.  Baseline: Eval: Functional component tasks, and hierarchy tasks to be established Goal status: INITIAL  2.  Pt. Will improve the BERG Balance score by 5 points to reduce fall risk during daily tasks. Baseline: Eval: BERG Balance score: 43/56 Goal status: INITIAL  3.  Pt. Will improve the Freezing of Gait score by 3 points.  Baseline: Eval: FOG score: 9 Goal status: INITIAL  4.  Pt. Increase the FOTO score by 3 points for Pt. perceived improvement with assessment specific ADLs, and IADL tasks.  Baseline: Eval: FOTO score: 48 Goal status: INITIAL  5.  Pt. Will write his name with 75% legibility in preparation for signing documents.  Baseline: Eval: Pt. signed his name with 50% legibility. Goal status: INITIAL    ASSESSMENT:  CLINICAL IMPRESSION:  Pt. presents with no reports of pain during the session, however Pt. has pre-existing bilateral knee pain as per above. Pt. was able to transition to performing the forward step and reach, sideways step and reach, forward rock and reach, and sideways rock and reach in the standard version without the chair. Pt. Continues to required the backwards step and reach in the adapted version with the chair. Pt. required increased cues to coordination bilateral UE movements during the MDE's. Pt. continues to require consistent verbal cues, tactile cues, visual demonstration, and modeling of each exercise. Pt. required supervision during the repetitive MDE's, and continues to require CGA when  attempting to increase the amplitude of the LE movements, and during BIG Ambulation. Pt. Continues to require cues for upright posture when standing and reaching. Pt. was able to tolerate the addition of  bilateral hand flicks to the seated side to side MDE. Pt. was able recall each step in the sequence of MDE exercises. Pt. required verbal  cues, visual cues, and modeling of the correct hand position during the exercises.  Pt. required tactile cues for LE position, UE alignment,  upright posture, and hand position. Pt. continues to benefit from OT services for the LSVT BIG program to specifically focus on increasing the amplitude of movements for improved UE functioning, balance, and safety during ADLs, and IADL tasks while reducing overall fall risk.          PERFORMANCE DEFICITS: in functional skills including ADLs, IADLs, coordination, ROM, strength, Fine motor control, and Gross motor control, cognitive skills including memory and problem solving, and psychosocial skills including coping strategies, environmental adaptation, habits, interpersonal interactions, and routines and behaviors.   IMPAIRMENTS: are limiting patient from ADLs, IADLs, and leisure.   CO-MORBIDITIES: may have co-morbidities  that affects occupational performance. Patient will benefit from skilled OT to address above impairments and improve overall function.  MODIFICATION OR ASSISTANCE TO COMPLETE EVALUATION: Min-Moderate modification of tasks or assist with assess necessary to complete an evaluation.  OT OCCUPATIONAL PROFILE AND HISTORY: Detailed assessment: Review of records and additional review of physical, cognitive, psychosocial history related to current functional performance.  CLINICAL DECISION MAKING: Moderate - several treatment options, min-mod task modification necessary  REHAB POTENTIAL: Good  EVALUATION COMPLEXITY: Moderate    PLAN:  OT FREQUENCY: 4x/week  OT DURATION: 6 weeks  PLANNED INTERVENTIONS:  self care/ADL training, therapeutic exercise, therapeutic activity, neuromuscular re-education, manual therapy, passive range of motion, functional mobility training, moist heat, patient/family education, cognitive remediation/compensation, and DME and/or AE instructions  RECOMMENDED OTHER SERVICES: SLP  CONSULTED AND AGREED WITH PLAN OF CARE: Patient and family member/caregiver  PLAN FOR NEXT SESSION:  continue to treatment, and refining MDE's  Harrel Carina, MS, OTR/L   Harrel Carina, OT 07/27/2022, 11:27 AM

## 2022-07-27 NOTE — Therapy (Addendum)
OUTPATIENT SPEECH LANGUAGE PATHOLOGY TREATMENT NOTE   Patient Name: Neil Bailey MRN: [id number] DOB:1951/04/21, 72 y.o., male Today's Date: 07/28/2022   PCP: Emily Filbert, MD REFERRING PROVIDER: Jennings Books, MD  END OF SESSION:   End of Session - 07/28/22 1007     Visit Number 5    Number of Visits 17    Date for SLP Re-Evaluation 09/07/22    Authorization Type [employer] HMO/PPO    Progress Note Due on Visit 10    SLP Start Time 0800    SLP Stop Time  0900    SLP Time Calculation (min) 60 min    Activity Tolerance Patient tolerated treatment well               Past Medical History:  Diagnosis Date   Aortic atherosclerosis ([place]) 03/09/2020   Noted on abdominal CT   Atrial fibrillation ([place])    COVID-19 06/2019   DVT (deep venous thrombosis) (HCC)    Dyspnea    since covid dec 2020   Dysrhythmia    paroxysmal atrial fibrillation   HTN (hypertension)    Lab test positive for detection of COVID-19 virus 06/30/2019   Morbid obesity ([place])    Pedal edema    Renal cyst, acquired, right    Thrombocytopenia ([place])    Past Surgical History:  Procedure Laterality Date   COLONOSCOPY  7-8 years ago   [facility]   COLONOSCOPY WITH PROPOFOL N/A 08/26/2020   Procedure: COLONOSCOPY WITH PROPOFOL;  Surgeon: Toledo, Benay Pike, MD;  Location: [facility] ENDOSCOPY;  Service: Gastroenterology;  Laterality: N/A;   CYSTOSCOPY Left 04/03/2020   Procedure: CYSTOSCOPY Birdena Crandall;  Surgeon: [name], MD;  Location: [facility] ORS;  Service: Urology;  Laterality: Left;   EYE SURGERY  05/2014, 06/2014   cataracts   HERNIA REPAIR     HYDROCELE EXCISION Right 08/07/2020   Procedure: HYDROCELECTOMY ADULT;  Surgeon: [name], MD;  Location: [facility] ORS;  Service: Urology;  Laterality: Right;   INGUINAL HERNIA REPAIR Right 01/19/2015   Procedure: RIGHT INCARCERATED INGUINAL HERNIA REPAIR;  Surgeon: Christene Lye, MD;  Location: [facility] ORS;  Service: General;  Laterality: Right;    TONSILLECTOMY     XI ROBOTIC ASSISTED INGUINAL HERNIA REPAIR WITH MESH Left 04/03/2020   Procedure: XI ROBOTIC ASSISTED INGUINAL HERNIA REPAIR WITH MESH, possible bilateral;  Surgeon: Jules Husbands, MD;  Location: [facility] ORS;  Service: General;  Laterality: Left;   Patient Active Problem List   Diagnosis Date Noted   SOB (shortness of breath) on exertion 06/23/2020   Pedal edema 06/08/2020   Renal cyst, acquired, right 06/08/2020   Thrombocytopenia ([place]) 02/11/2020   Acute deep vein thrombosis (DVT) of popliteal vein of right lower extremity ([place]) 07/18/2019   History of 2019 novel coronavirus disease (COVID-19) 07/18/2019   Acute respiratory failure with hypoxia ([place]) 07/04/2019   Atrial fibrillation, chronic ([place]) 07/04/2019   Pneumonia due to COVID-19 virus 07/01/2019   CAP (community acquired pneumonia) 06/30/2019   Medicare annual wellness visit, initial 03/20/2017   Morbid obesity with BMI of 40.0-44.9, adult ([place]) 12/29/2016   History of DVT (deep vein thrombosis) 10/17/2013    ONSET DATE: 05/05/2022  REFERRING DIAG: G20.A1 (ICD-10-CM) - Parkinson's disease    THERAPY DIAG:  Cognitive communication deficit   Dysarthria  THERAPY DIAG:  Cognitive communication deficit  Rationale for Evaluation and Treatment Rehabilitation  SUBJECTIVE: Pt appeared in a good mood and motivated to work.   Pt accompanied by: self  PAIN:  Are you having pain? No  PATIENT GOALS: improve his memory  OBJECTIVE:   TODAY'S TREATMENT:  Skilled treatment session focused on pt's cognitive communication goals. SLP facilitated the session by providing the following interventions: N-Back Level 2: when given minimal assistance the PT achieved a 75%, the PT did state that the level 2 was harder for him than level 1 and his hands were getting tired.  During the repeating a pattern activity (level 3) the PT achieved a score of 87% with minimal cueing to utilize the replay button when in need of  assistance.  When completing the remember information about a person activity on level, PT achieved a score of 80% and noted an increase in distractions during this particular activity.  During the Follow Instructions you hear activity, the client achieved a score of 100% during level 1 and a score of 79% on level 3. He required cueing to utilize the replay button when in need of assistance and also struggled with hearing some words correctly (hearing ring for wing).        PATIENT EDUCATION: Education details: see above Person educated: Patient Education method: Education officer, environmental, and Verbal cues Education comprehension: verbalized understanding  HOME EXERCISE PROGRAM:  Put list of medications in his money clip Purchase a calendar and put in bill due dates  GOALS: Goals reviewed with patient? Yes  SHORT TERM GOALS: Target date: 10 sessions   The patient will maximize voice quality and loudness using breath support for sustained vowel production, pitch glides, and hierarchal speech drill. Baseline: Goal status: INITIAL   2.   The patient will demonstrate abdominal breathing patterns and steady release of breath on exhalation to optimize efficiency of voicing and decrease laryngeal hyperfunction Baseline:  Goal status: INITIAL   3.  Patient will report improved communication effectiveness as measured by Communicative Effectiveness Survey.  Baseline:  Goal status: INITIAL   4.  Pt will use external aid for functional recall of completed/upcoming activities 75% accuracy with Min A.  Baseline:  Goal status: INITIAL   5.  Pt will use strategies to improve memory for important information with 75% acc. with minimal assistance (ie., white board, daily planner/calendar, Apps on phone).  Baseline:  Goal status: INITIAL   6.  Pt will complete mod complex money/financial problems 100% accuracy in a reasonable amount of time with double checking, use of strategies and rare  min A. Baseline:  Goal status: INITIAL   LONG TERM GOALS: Target date: 09/07/2022 Pt will complete mod complex money/financial problems 100% accuracy in a reasonable amount of time with double checking, use of strategies Baseline:  Goal status: INITIAL   2.  Pt will use strategies to improve memory for important information with 75% acc. (ie., white board, daily planner/calendar, Apps on phone).  Baseline:  Goal status: INITIAL   3.  Pt will use external aid for functional recall of completed/upcoming activities > 90% accuracy Baseline:  Goal status: INITIAL   4.  Pt will read paragraph length material with rare Min A cues for use of loud, good quality voice.  Baseline:  Goal status: INITIAL    ASSESSMENT:  CLINICAL IMPRESSION:  Pt continues to eagerly participate in ST sessions but uncertain if pt will be motivated to use strategies during functional daily activities.   OBJECTIVE IMPAIRMENTS include memory and dysarthria. These impairments are limiting patient from managing medications, managing appointments, managing finances, household responsibilities, ADLs/IADLs, and effectively communicating at home and in community. Factors affecting  potential to achieve goals and functional outcome are ability to learn/carryover information, co-morbidities, and previous level of function. Patient will benefit from skilled SLP services to address above impairments and improve overall function.  REHAB POTENTIAL:   PLAN: SLP FREQUENCY: 2x/week  SLP DURATION: 8 weeks  PLANNED INTERVENTIONS: Environmental controls, Cognitive reorganization, Internal/external aids, Functional tasks, SLP instruction and feedback, Compensatory strategies, and Patient/family education  Alphonzo Grieve, SLP Graduate Clinician    Happi B. Rutherford Nail, M.S., CCC-SLP, Mining engineer Certified Brain Injury [place]  [place] Office  [phone] Ascom [phone] Fax [phone]

## 2022-07-27 NOTE — Therapy (Incomplete Revision)
OUTPATIENT SPEECH LANGUAGE PATHOLOGY TREATMENT NOTE   Patient Name: Neil Bailey MRN: [id number] DOB:[birth year], 72 y.o., male Today's Date: 07/27/2022   PCP: Emily Filbert, MD REFERRING PROVIDER: Jennings Books, MD  END OF SESSION:   End of Session - 07/27/22 1247     SLP Start Time 0800    SLP Stop Time  0900    SLP Time Calculation (min) 60 min               Past Medical History:  Diagnosis Date   Aortic atherosclerosis ([place]) 03/09/2020   Noted on abdominal CT   Atrial fibrillation ([place])    COVID-19 06/2019   DVT (deep venous thrombosis) (HCC)    Dyspnea    since covid dec 2020   Dysrhythmia    paroxysmal atrial fibrillation   HTN (hypertension)    Lab test positive for detection of COVID-19 virus 06/30/2019   Morbid obesity ([place])    Pedal edema    Renal cyst, acquired, right    Thrombocytopenia ([place])    Past Surgical History:  Procedure Laterality Date   COLONOSCOPY  7-8 years ago   [facility]   COLONOSCOPY WITH PROPOFOL N/A 08/26/2020   Procedure: COLONOSCOPY WITH PROPOFOL;  Surgeon: Toledo, Benay Pike, MD;  Location: [facility] ENDOSCOPY;  Service: Gastroenterology;  Laterality: N/A;   CYSTOSCOPY Left 04/03/2020   Procedure: CYSTOSCOPY Birdena Crandall;  Surgeon: [name], MD;  Location: [facility] ORS;  Service: Urology;  Laterality: Left;   EYE SURGERY  05/2014, 06/2014   cataracts   HERNIA REPAIR     HYDROCELE EXCISION Right 08/07/2020   Procedure: HYDROCELECTOMY ADULT;  Surgeon: [name], MD;  Location: [facility] ORS;  Service: Urology;  Laterality: Right;   INGUINAL HERNIA REPAIR Right 01/19/2015   Procedure: RIGHT INCARCERATED INGUINAL HERNIA REPAIR;  Surgeon: Christene Lye, MD;  Location: [facility] ORS;  Service: General;  Laterality: Right;   TONSILLECTOMY     XI ROBOTIC ASSISTED INGUINAL HERNIA REPAIR WITH MESH Left 04/03/2020   Procedure: XI ROBOTIC ASSISTED INGUINAL HERNIA REPAIR WITH MESH, possible bilateral;  Surgeon: Jules Husbands, MD;  Location: [facility]  ORS;  Service: General;  Laterality: Left;   Patient Active Problem List   Diagnosis Date Noted   SOB (shortness of breath) on exertion 06/23/2020   Pedal edema 06/08/2020   Renal cyst, acquired, right 06/08/2020   Thrombocytopenia ([place]) 02/11/2020   Acute deep vein thrombosis (DVT) of popliteal vein of right lower extremity ([place]) 07/18/2019   History of 2019 novel coronavirus disease (COVID-19) 07/18/2019   Acute respiratory failure with hypoxia ([place]) 07/04/2019   Atrial fibrillation, chronic ([place]) 07/04/2019   Pneumonia due to COVID-19 virus 07/01/2019   CAP (community acquired pneumonia) 06/30/2019   Medicare annual wellness visit, initial 03/20/2017   Morbid obesity with BMI of 40.0-44.9, adult ([place]) 12/29/2016   History of DVT (deep vein thrombosis) 10/17/2013    ONSET DATE: 05/05/2022  REFERRING DIAG: G20.A1 (ICD-10-CM) - Parkinson's disease    THERAPY DIAG:  Cognitive communication deficit   Dysarthria  THERAPY DIAG:  Cognitive communication deficit  Rationale for Evaluation and Treatment Rehabilitation  SUBJECTIVE: Pt appeared in a good mood and motivated to work.   Pt accompanied by: self  PAIN:  Are you having pain? No  PATIENT GOALS: improve his memory  OBJECTIVE:   TODAY'S TREATMENT:  Skilled treatment session focused on pt's cognitive communication goals. SLP facilitated the session by providing the following interventions: N-Back Level 2: when given minimal assistance the  PT achieved a 75%, the PT did state that the level 2 was harder for him than level 1 and his hands were getting tired.  During the repeating a pattern activity (level 3) the PT achieved a score of 87% with minimal cueing to utilize the replay button when in need of assistance.  When completing the remember information about a person activity on level, PT achieved a score of 80% and noted an increase in distractions during this particular activity.  During the Follow Instructions you  hear activity, the client achieved a score of 100% during level 1 and a score of 79% on level 3. He required cueing to utilize the replay button when in need of assistance and also struggled with hearing some words correctly (hearing ring for wing).        PATIENT EDUCATION: Education details: see above Person educated: Patient Education method: Education officer, environmental, and Verbal cues Education comprehension: verbalized understanding  HOME EXERCISE PROGRAM:  Put list of medications in his money clip Purchase a calendar and put in bill due dates  GOALS: Goals reviewed with patient? Yes  SHORT TERM GOALS: Target date: 10 sessions   The patient will maximize voice quality and loudness using breath support for sustained vowel production, pitch glides, and hierarchal speech drill. Baseline: Goal status: INITIAL   2.   The patient will demonstrate abdominal breathing patterns and steady release of breath on exhalation to optimize efficiency of voicing and decrease laryngeal hyperfunction Baseline:  Goal status: INITIAL   3.  Patient will report improved communication effectiveness as measured by Communicative Effectiveness Survey.  Baseline:  Goal status: INITIAL   4.  Pt will use external aid for functional recall of completed/upcoming activities 75% accuracy with Min A.  Baseline:  Goal status: INITIAL   5.  Pt will use strategies to improve memory for important information with 75% acc. with minimal assistance (ie., white board, daily planner/calendar, Apps on phone).  Baseline:  Goal status: INITIAL   6.  Pt will complete mod complex money/financial problems 100% accuracy in a reasonable amount of time with double checking, use of strategies and rare min A. Baseline:  Goal status: INITIAL   LONG TERM GOALS: Target date: 09/07/2022 Pt will complete mod complex money/financial problems 100% accuracy in a reasonable amount of time with double checking, use of  strategies Baseline:  Goal status: INITIAL   2.  Pt will use strategies to improve memory for important information with 75% acc. (ie., white board, daily planner/calendar, Apps on phone).  Baseline:  Goal status: INITIAL   3.  Pt will use external aid for functional recall of completed/upcoming activities > 90% accuracy Baseline:  Goal status: INITIAL   4.  Pt will read paragraph length material with rare Min A cues for use of loud, good quality voice.  Baseline:  Goal status: INITIAL    ASSESSMENT:  CLINICAL IMPRESSION:  Pt continues to eagerly participate in ST sessions but uncertain if pt will be motivated to use strategies during functional daily activities.   OBJECTIVE IMPAIRMENTS include memory and dysarthria. These impairments are limiting patient from managing medications, managing appointments, managing finances, household responsibilities, ADLs/IADLs, and effectively communicating at home and in community. Factors affecting potential to achieve goals and functional outcome are ability to learn/carryover information, co-morbidities, and previous level of function. Patient will benefit from skilled SLP services to address above impairments and improve overall function.  REHAB POTENTIAL:   PLAN: SLP FREQUENCY: 2x/week  SLP DURATION: 8  weeks  PLANNED INTERVENTIONS: Environmental controls, Cognitive reorganization, Internal/external aids, Functional tasks, SLP instruction and feedback, Compensatory strategies, and Patient/family education  Alphonzo Grieve, SLP Graduate Clinician    Stasia Somero B. Rutherford Nail, M.S., CCC-SLP, Mining engineer Certified Brain Injury [place]  [place] Office [phone] Ascom [phone] Fax [phone]

## 2022-07-29 ENCOUNTER — Ambulatory Visit: Payer: Medicare HMO | Admitting: Occupational Therapy

## 2022-07-29 ENCOUNTER — Encounter: Payer: Self-pay | Admitting: Occupational Therapy

## 2022-07-29 ENCOUNTER — Encounter: Payer: Medicare HMO | Admitting: Speech Pathology

## 2022-07-29 DIAGNOSIS — R278 Other lack of coordination: Secondary | ICD-10-CM

## 2022-07-29 DIAGNOSIS — M6281 Muscle weakness (generalized): Secondary | ICD-10-CM

## 2022-07-29 DIAGNOSIS — R41841 Cognitive communication deficit: Secondary | ICD-10-CM | POA: Diagnosis not present

## 2022-07-30 NOTE — Therapy (Signed)
OUTPATIENT OCCUPATIONAL THERAPY NEURO/LSVT TREATMENT  Patient Name: Neil Bailey MRN: [id number] DOB:December 11, 1950, 72 y.o., male Today's Date: 07/29/2022  PCP: Emily Filbert, MD REFERRING PROVIDER: Jennings Books, MD  END OF SESSION:  OT End of Session - 07/30/22 2154     Visit Number 9    Number of Visits 17    Date for OT Re-Evaluation 08/25/22    Authorization Time Period Progress report period starting 07/14/2022    OT Start Time 0859    OT Stop Time 1100    OT Time Calculation (min) 121 min    Activity Tolerance Patient tolerated treatment well    Behavior During Therapy [facility] for tasks assessed/performed             Past Medical History:  Diagnosis Date   Aortic atherosclerosis ([place]) 03/09/2020   Noted on abdominal CT   Atrial fibrillation ([place])    COVID-19 06/2019   DVT (deep venous thrombosis) (HCC)    Dyspnea    since covid dec 2020   Dysrhythmia    paroxysmal atrial fibrillation   HTN (hypertension)    Lab test positive for detection of COVID-19 virus 06/30/2019   Morbid obesity ([place])    Pedal edema    Renal cyst, acquired, right    Thrombocytopenia ([place])    Past Surgical History:  Procedure Laterality Date   COLONOSCOPY  7-8 years ago   [facility]   COLONOSCOPY WITH PROPOFOL N/A 08/26/2020   Procedure: COLONOSCOPY WITH PROPOFOL;  Surgeon: Toledo, Benay Pike, MD;  Location: [facility];  Service: Gastroenterology;  Laterality: N/A;   CYSTOSCOPY Left 04/03/2020   Procedure: CYSTOSCOPY Birdena Crandall;  Surgeon: [name], MD;  Location: [facility] ORS;  Service: Urology;  Laterality: Left;   EYE SURGERY  05/2014, 06/2014   cataracts   HERNIA REPAIR     HYDROCELE EXCISION Right 08/07/2020   Procedure: HYDROCELECTOMY ADULT;  Surgeon: [name], MD;  Location: [facility] ORS;  Service: Urology;  Laterality: Right;   INGUINAL HERNIA REPAIR Right 01/19/2015   Procedure: RIGHT INCARCERATED INGUINAL HERNIA REPAIR;  Surgeon: Christene Lye, MD;  Location: [facility]  ORS;  Service: General;  Laterality: Right;   TONSILLECTOMY     XI ROBOTIC ASSISTED INGUINAL HERNIA REPAIR WITH MESH Left 04/03/2020   Procedure: XI ROBOTIC ASSISTED INGUINAL HERNIA REPAIR WITH MESH, possible bilateral;  Surgeon: Jules Husbands, MD;  Location: [facility] ORS;  Service: General;  Laterality: Left;   Patient Active Problem List   Diagnosis Date Noted   SOB (shortness of breath) on exertion 06/23/2020   Pedal edema 06/08/2020   Renal cyst, acquired, right 06/08/2020   Thrombocytopenia ([place]) 02/11/2020   Acute deep vein thrombosis (DVT) of popliteal vein of right lower extremity ([place]) 07/18/2019   History of 2019 novel coronavirus disease (COVID-19) 07/18/2019   Acute respiratory failure with hypoxia ([place]) 07/04/2019   Atrial fibrillation, chronic ([place]) 07/04/2019   Pneumonia due to COVID-19 virus 07/01/2019   CAP (community acquired pneumonia) 06/30/2019   Medicare annual wellness visit, initial 03/20/2017   Morbid obesity with BMI of 40.0-44.9, adult ([place]) 12/29/2016   History of DVT (deep vein thrombosis) 10/17/2013    ONSET DATE: 12/09/2021  REFERRING DIAG: Parkinson's Disease  THERAPY DIAG:  Muscle weakness (generalized)  Other lack of coordination  Rationale for Evaluation and Treatment: Rehabilitation  SUBJECTIVE:   SUBJECTIVE STATEMENT: Pt reports he is doing well, reports he was able to perform some of the exercises in therapy the other day without the use  of the chair.   Continues to have knee pain bilaterally.  Pt accompanied by: self  PERTINENT HISTORY:  Pt. is a 72 y.o. male who was diagnosed with Parkinson's Disease in June of 2023.   PRECAUTIONS: None  WEIGHT BEARING RESTRICTIONS: No  PAIN:  Are you having pain? Knee pain 5-6/10 right knee pain  FALLS: Has patient fallen in last 6 months? No  LIVING ENVIRONMENT: Lives with: lives with their spouse Lives in: House/apartment Stairs: 3- 4 steps to enter. 2 story home Has following equipment at  home: None  PLOF: Independent  PATIENT GOALS: Regain stability  OBJECTIVE:   HAND DOMINANCE: Ambidextrous  ADLs: Overall ADLs:   Transfers/ambulation related to ADLs: Eating:  Tremors, uses a spoon instead of a fork Grooming: Difficulty aligning  toothpaste on the tooth brush, difficulty cutting toenails-Consistently goes to a nail salon to have them done.  UB Dressing:  Independent LB Dressing: Independent, increased time to complete Toileting: Independent Bathing: Independent Tub Shower transfers: Independent    IADLs: Shopping: Independent Light housekeeping: No changes noted  Meal Prep: Does not cook, grills Community mobility:  Driving Independently Medication management: Independent with weekly pillbox Financial management: No changes noted Handwriting: 50% legible Name in cursive writing  MOBILITY STATUS: Independent  POSTURE COMMENTS:   Sitting balance:  Good unsupported sitting balance  ACTIVITY TOLERANCE: Activity tolerance: Pt. requires rest breaks during standing assessments  FUNCTIONAL OUTCOME MEASURES:  FOTO: 48 5x's STS: 18 sec. TUG: 11 sec. Freezing of Gait: 9 BERG Balance Scale: 43/56  07/18/2022:   6 minute walk test: 890 ft. with a seated rest break required at the end.   UPPER EXTREMITY ROM:    Active ROM Right Eval WFL  Left Eval [facility]  Shoulder flexion    Shoulder abduction    Shoulder adduction    Shoulder extension    Shoulder internal rotation    Shoulder external rotation    Elbow flexion    Elbow extension    Wrist flexion    Wrist extension    Wrist ulnar deviation    Wrist radial deviation    Wrist pronation    Wrist supination    (Blank rows = not tested)  UPPER EXTREMITY MMT:     MMT Right Eval 5/5 Left Eval 5/5  Shoulder flexion    Shoulder abduction    Shoulder adduction    Shoulder extension    Shoulder internal rotation    Shoulder external rotation    Middle trapezius    Lower trapezius     Elbow flexion    Elbow extension    Wrist flexion    Wrist extension    Wrist ulnar deviation    Wrist radial deviation    Wrist pronation    Wrist supination    (Blank rows = not tested)  HAND FUNCTION: Grip strength: Right: 68 lbs; Left: 64 lbs and Lateral pinch: Right: 18 lbs, Left: 18 lbs 3pt. Pinch Right: 18 lbs, Left: 17 lbs  COORDINATION: 9 Hole Peg test: Right: 33 sec; Left: 27 sec  SENSATION: WFL  EDEMA: N/A  MUSCLE TONE:  WFL  COGNITION: Overall cognitive status: Pt. reports cognitive/memory changes. Pt. being seen by SLP  VISION: Subjective report:  Pt. reports no changes with vision.  VISION ASSESSMENT:  WFL for tasks assessed  PERCEPTION:  TBA  PRAXIS: Impaired: Motor planning  TODAY'S TREATMENT:  DATE: 07/29/2022    LSVT: Patient seen for LSVT Daily Session Maximal Daily Exercises for facilitation/coordination of movement. Maximum Sustained Movements are designed to rescale the amplitude of movement output for generalization to daily functional activities. Performed as follows for 1 set of 10 repetitions each multi-directional sustained movements: 1) Floor to ceiling- Fewer cues were required for form.  Continued cues for opening the hand fully between each finger flick.  2) Side to side multidirectional- Pt. required fewer cues for increased hip, and knee extension. No tactile cues needed for hip, and knee flexion. Finger flicks performed for right and left sides this date.   3) Step and reach forward step- Cues were required for upright midline posture.The exercise was performed in the standard version without the chair, min guard from therapist. 4) Step and reach sideways step- Pt. required  fewer verbal cues to increase right knee flexion when stepping to the left. This exercise was performed in the standard version without the  chair, min guard from therapist. 5) Step and reach backwards step- Improved with a larger steps back posteriorly.  Pt. Was able to complete the movements more fluidly. With no cuing required for Iifting of the toes.  This exercise was performed today in standard version with min assist for balance from therapist.  6) [place] and reach forward/backward- This exercise was performed in the standard version without a chair. Pt. required cues to coordinate the bilateral UE movements.  Min guard provided by therapist for balance. 7) Rock and reach sideways- Pt. continues to present with limited rotation through the trunk when turning to look over his shoulders. Pt. required cues for UE extension, and to spread digits. Pt. Performed the task in the standard version without the chair.  Discussed using the chair at home as needed over the weekend, especially for stepping backward and for rock and reach exercises. Will reassess next session and make recommendations for any changes in HEP.     Functional Component Task:   1.Sit to stand BIG functional component task for 5 reps with visual visual demonstration, visual, and tactile cues. Pt. was able to perform the functional component task more fluidly from start to finish. Verbal cues, and visual cues were required for posture, and hand position. 2.Stepping over objects: Min guard  3.Increasing Arm swing: Worked on reciprocal arm swing functionally during ambulation this date.  Cues for arm positioning and amplitude of movement patterns. 4:Turning/Elongating the trunk: Reaching tasks bilaterally to facilitate trunk rotation. 5: Formulating large letters for his signature: Pt. worked on large/BIG strokes in sitting with formulation of lists with good legibility this date and improved letter size formation. Pt. worked on bilateral pectoral stretches in standing at the wall followed by sitting. Pt. required cues for visual demonstration, and education about the  importance of pectoral stretches in promoting upright midline posture with Parkinson's Disease.   7  hand exercises to help promote handwriting, dexterity and flexibility. 1)  Fisting with arm then arm extension with fingers extended with BIG hand movements 2) oppositional movements of the thumb to each digit 3) wrist flex/ext with elbows extended 4) supination/pronation 5) tendon gliding with hook fist and then finger extension 6) tendon gliding with tabletop movement of fingers with MP flexion, PIP and DIP extension 7) MP flexion, PIP flexion, DIP extension Performed with therapist demo and occasional cues for form  BIG ambulation:     Pt. performed BIG Ambulation with emphasis placed on increasing amplitude of movements for arm swing, and  large, long steps. Pt. was able to complete  a total of 400 ft. Pt. worked on [employer] the whole distance with emphasis placed on armswing, and large steps, and marching with high knees.          PATIENT EDUCATION: Education details: LSVT MDE's Person educated: Patient Education method: Explanation, Demonstration, Tactile cues, and Verbal cues Education comprehension: verbalized understanding  HOME EXERCISE PROGRAM:  To be determined    GOALS: Goals reviewed with patient? Yes  SHORT TERM GOALS: Target date: 08/04/2022    Pt. Will be independent with HEPs for Maximal Daily Exercises, including modifications. Baseline: Eval: No current HEP. Training to begin for the MDE on the next visit. Goal status: INITIAL    LONG TERM GOALS: Target date: 08/25/2022    Pt. Will be independent with the Established LSVT  Functional component, and Hierarchy tasks.  Baseline: Eval: Functional component tasks, and hierarchy tasks to be established Goal status: INITIAL  2.  Pt. Will improve the BERG Balance score by 5 points to reduce fall risk during daily tasks. Baseline: Eval: BERG Balance score: 43/56 Goal status: INITIAL  3.  Pt. Will  improve the Freezing of Gait score by 3 points.  Baseline: Eval: FOG score: 9 Goal status: INITIAL  4.  Pt. Increase the FOTO score by 3 points for Pt. perceived improvement with assessment specific ADLs, and IADL tasks.  Baseline: Eval: FOTO score: 48 Goal status: INITIAL  5.  Pt. Will write his name with 75% legibility in preparation for signing documents.  Baseline: Eval: Pt. signed his name with 50% legibility. Goal status: INITIAL    ASSESSMENT:  CLINICAL IMPRESSION: Pt has continued to make good progress in all areas.  He has been able to transition to performing exercises in the clinic in the standard version with min guard to min assist from therapist for balance.  Recommend pt have the chair beside him at home when performing exercises this weekend and continue to use chair when performing backwards step, rock and reach exercises in standing until he can perform consistently in the clinic with the goal to transition to performing all exercises in the standard version in the future.  Handwriting improving with legibility and size of letter formation.  Pt still tends to demonstrate shuffling gait and especially when distracted and needs continued focus in this area to improve amplitude of gait.  Will plan to perform Progress Update next session with outcome measures and goal update.             PERFORMANCE DEFICITS: in functional skills including ADLs, IADLs, coordination, ROM, strength, Fine motor control, and Gross motor control, cognitive skills including memory and problem solving, and psychosocial skills including coping strategies, environmental adaptation, habits, interpersonal interactions, and routines and behaviors.   IMPAIRMENTS: are limiting patient from ADLs, IADLs, and leisure.   CO-MORBIDITIES: may have co-morbidities  that affects occupational performance. Patient will benefit from skilled OT to address above impairments and improve overall function.  MODIFICATION OR  ASSISTANCE TO COMPLETE EVALUATION: Min-Moderate modification of tasks or assist with assess necessary to complete an evaluation.  OT OCCUPATIONAL PROFILE AND HISTORY: Detailed assessment: Review of records and additional review of physical, cognitive, psychosocial history related to current functional performance.  CLINICAL DECISION MAKING: Moderate - several treatment options, min-mod task modification necessary  REHAB POTENTIAL: Good  EVALUATION COMPLEXITY: Moderate    PLAN:  OT FREQUENCY: 4x/week  OT DURATION: 6 weeks  PLANNED INTERVENTIONS: self care/ADL training, therapeutic exercise, therapeutic  activity, neuromuscular re-education, manual therapy, passive range of motion, functional mobility training, moist heat, patient/family education, cognitive remediation/compensation, and DME and/or AE instructions  RECOMMENDED OTHER SERVICES: SLP  CONSULTED AND AGREED WITH PLAN OF CARE: Patient and family member/caregiver  PLAN FOR NEXT SESSION:  continue to treatment, and refining MDE's  Jolicia Delira Oneita Jolly, OTR/L, CLT  Aidin Doane, OT 07/30/2022, 10:12 PM

## 2022-08-01 ENCOUNTER — Ambulatory Visit: Payer: Medicare HMO | Admitting: Occupational Therapy

## 2022-08-01 ENCOUNTER — Encounter: Payer: Medicare HMO | Admitting: Speech Pathology

## 2022-08-01 DIAGNOSIS — M6281 Muscle weakness (generalized): Secondary | ICD-10-CM

## 2022-08-01 DIAGNOSIS — R41841 Cognitive communication deficit: Secondary | ICD-10-CM | POA: Diagnosis not present

## 2022-08-01 NOTE — Therapy (Signed)
Occupational Therapy Progress Note  Dates of reporting period  07/14/2022   to   08/01/2022   Patient Name: Neil Bailey MRN: [id number] DOB:1950-11-04, 72 y.o., male Today's Date: 08/01/2022  PCP: Emily Filbert, MD REFERRING PROVIDER: Jennings Books, MD  END OF SESSION:  OT End of Session - 08/01/22 1548     Visit Number 10    Number of Visits 17    Date for OT Re-Evaluation 08/25/22    Authorization Time Period Progress report period starting 07/14/2022    OT Start Time 0900    OT Stop Time 1000    OT Time Calculation (min) 60 min    Activity Tolerance Patient tolerated treatment well    Behavior During Therapy [facility] for tasks assessed/performed              Past Medical History:  Diagnosis Date   Aortic atherosclerosis ([place]) 03/09/2020   Noted on abdominal CT   Atrial fibrillation ([place])    COVID-19 06/2019   DVT (deep venous thrombosis) (HCC)    Dyspnea    since covid dec 2020   Dysrhythmia    paroxysmal atrial fibrillation   HTN (hypertension)    Lab test positive for detection of COVID-19 virus 06/30/2019   Morbid obesity ([place])    Pedal edema    Renal cyst, acquired, right    Thrombocytopenia ([place])    Past Surgical History:  Procedure Laterality Date   COLONOSCOPY  7-8 years ago   [facility]   COLONOSCOPY WITH PROPOFOL N/A 08/26/2020   Procedure: COLONOSCOPY WITH PROPOFOL;  Surgeon: Toledo, Benay Pike, MD;  Location: [facility] ENDOSCOPY;  Service: Gastroenterology;  Laterality: N/A;   CYSTOSCOPY Left 04/03/2020   Procedure: CYSTOSCOPY Birdena Crandall;  Surgeon: [name], MD;  Location: [facility] ORS;  Service: Urology;  Laterality: Left;   EYE SURGERY  05/2014, 06/2014   cataracts   HERNIA REPAIR     HYDROCELE EXCISION Right 08/07/2020   Procedure: HYDROCELECTOMY ADULT;  Surgeon: [name], MD;  Location: [facility] ORS;  Service: Urology;  Laterality: Right;   INGUINAL HERNIA REPAIR Right 01/19/2015   Procedure: RIGHT INCARCERATED INGUINAL HERNIA REPAIR;  Surgeon:  Christene Lye, MD;  Location: [facility] ORS;  Service: General;  Laterality: Right;   TONSILLECTOMY     XI ROBOTIC ASSISTED INGUINAL HERNIA REPAIR WITH MESH Left 04/03/2020   Procedure: XI ROBOTIC ASSISTED INGUINAL HERNIA REPAIR WITH MESH, possible bilateral;  Surgeon: Jules Husbands, MD;  Location: [facility] ORS;  Service: General;  Laterality: Left;   Patient Active Problem List   Diagnosis Date Noted   SOB (shortness of breath) on exertion 06/23/2020   Pedal edema 06/08/2020   Renal cyst, acquired, right 06/08/2020   Thrombocytopenia ([place]) 02/11/2020   Acute deep vein thrombosis (DVT) of popliteal vein of right lower extremity ([place]) 07/18/2019   History of 2019 novel coronavirus disease (COVID-19) 07/18/2019   Acute respiratory failure with hypoxia ([place]) 07/04/2019   Atrial fibrillation, chronic ([place]) 07/04/2019   Pneumonia due to COVID-19 virus 07/01/2019   CAP (community acquired pneumonia) 06/30/2019   Medicare annual wellness visit, initial 03/20/2017   Morbid obesity with BMI of 40.0-44.9, adult ([place]) 12/29/2016   History of DVT (deep vein thrombosis) 10/17/2013    ONSET DATE: 12/09/2021  REFERRING DIAG: Parkinson's Disease  THERAPY DIAG:  Muscle weakness (generalized)  Rationale for Evaluation and Treatment: Rehabilitation  SUBJECTIVE:   SUBJECTIVE STATEMENT:  Pt. reports that he did the exercises once a day over the weekend. Pt.  Education was provided about the importance for performing the exercises 2x's a day.  Pt accompanied by: self  PERTINENT HISTORY:  Pt. is a 72 y.o. male who was diagnosed with Parkinson's Disease in June of 2023.   PRECAUTIONS: None  WEIGHT BEARING RESTRICTIONS: No  PAIN:  Are you having pain? Knee pain 5-6/10 right knee pain  FALLS: Has patient fallen in last 6 months? No  LIVING ENVIRONMENT: Lives with: lives with their spouse Lives in: House/apartment Stairs: 3- 4 steps to enter. 2 story home Has following equipment at home:  None  PLOF: Independent  PATIENT GOALS: Regain stability  OBJECTIVE:   HAND DOMINANCE: Ambidextrous  ADLs: Overall ADLs:   Transfers/ambulation related to ADLs: Eating:  Tremors, uses a spoon instead of a fork Grooming: Difficulty aligning  toothpaste on the tooth brush, difficulty cutting toenails-Consistently goes to a nail salon to have them done.  UB Dressing:  Independent LB Dressing: Independent, increased time to complete Toileting: Independent Bathing: Independent Tub Shower transfers: Independent    IADLs: Shopping: Independent Light housekeeping: No changes noted  Meal Prep: Does not cook, grills Community mobility:  Driving Independently Medication management: Independent with weekly pillbox Financial management: No changes noted Handwriting: 50% legible Name in cursive writing  MOBILITY STATUS: Independent  POSTURE COMMENTS:   Sitting balance:  Good unsupported sitting balance  ACTIVITY TOLERANCE: Activity tolerance: Pt. requires rest breaks during standing assessments  FUNCTIONAL OUTCOME MEASURES:  FOTO: 48 5x's STS: 18 sec. TUG: 11 sec. Freezing of Gait: 9 BERG Balance Scale: 43/56  08/01/2022  FOTO: 50 5x's STS: 14 sec. TUG: 9 sec. Freezing of Gait: 7 BERG Balance Scale: 46/56; Standing one foot in front of the other: This test was scored with the left foot in front of the right: 0, (Pt. Then attempted with the right foot in front of the left with a score of 4)   07/18/2022:   6 minute walk test: 890 ft. with a seated rest break required at the end.  6 minute walk test: 960 ft. with a seated rest break required at the end.  UPPER EXTREMITY ROM:    Active ROM Right Eval WFL  Left Eval [facility]  Shoulder flexion    Shoulder abduction    Shoulder adduction    Shoulder extension    Shoulder internal rotation    Shoulder external rotation    Elbow flexion    Elbow extension    Wrist flexion    Wrist extension    Wrist ulnar deviation     Wrist radial deviation    Wrist pronation    Wrist supination    (Blank rows = not tested)  UPPER EXTREMITY MMT:     MMT Right Eval 5/5 Left Eval 5/5  Shoulder flexion    Shoulder abduction    Shoulder adduction    Shoulder extension    Shoulder internal rotation    Shoulder external rotation    Middle trapezius    Lower trapezius    Elbow flexion    Elbow extension    Wrist flexion    Wrist extension    Wrist ulnar deviation    Wrist radial deviation    Wrist pronation    Wrist supination    (Blank rows = not tested)  HAND FUNCTION: Grip strength: Right: 68 lbs; Left: 64 lbs and Lateral pinch: Right: 18 lbs, Left: 18 lbs 3pt. Pinch Right: 18 lbs, Left: 17 lbs  Grip strength: Right: 68 lbs; Left: 58 lbs and  Lateral pinch: Right: 18 lbs, Left: 18 lbs 3pt. Pinch Right: 16 lbs, Left: 18 lbs  COORDINATION: 9 Hole Peg test: Right: 33 sec; Left: 27 sec  9 Hole Peg test: Right: 34 sec; Left: 28 sec   SENSATION: WFL  EDEMA: N/A  MUSCLE TONE:  WFL  COGNITION: Overall cognitive status: Pt. reports cognitive/memory changes. Pt. being seen by SLP  VISION: Subjective report:  Pt. reports no changes with vision.  VISION ASSESSMENT:  WFL for tasks assessed  PERCEPTION:  TBA  PRAXIS: Impaired: Motor planning  TODAY'S TREATMENT:                                                                                                                              PATIENT EDUCATION: Education details: LSVT MDE's, Goals, POC Person educated: Patient Education method: Explanation, Demonstration, Tactile cues, and Verbal cues Education comprehension: verbalized understanding  HOME EXERCISE PROGRAM:  To be determined    GOALS: Goals reviewed with patient? Yes  SHORT TERM GOALS: Target date: 08/04/2022    Pt. Will be independent with HEPs for Maximal Daily Exercises, including modifications. Baseline: 08/01/2022: Pt. Was provided with the adapted version of MDE  initially, and upgraded to the Standard version. Pt. Continues to require cues, and visual demonstration/modeling of the exercises. Eval: No current HEP. Training to begin for the MDE on the next visit. Goal status: INITIAL    LONG TERM GOALS: Target date: 08/25/2022    Pt. Will be independent with the Established LSVT  Functional component, and Hierarchy tasks.  Baseline: 08/01/2022: pt. Requires continued work on the optimizing, and increasing the amplitude for the Functional component, and Hierarchy tasks. Eval: Functional component tasks, and hierarchy tasks to be established Goal status: Ongoing  2.  Pt. Will improve the BERG Balance score by 5 points to reduce fall risk during daily tasks. Baseline: 08/01/2022: BERG: 46/56 Eval: BERG Balance score: 43/56 Goal status: Ongoing    3.  Pt. Will improve the Freezing of Gait score by 3 points.  Baseline: 08/01/2022: FOG score: 7 Eval: FOG score: 9 Goal status: Ongoing  4.  Pt. Increase the FOTO score by 3 points for Pt. perceived improvement with assessment specific ADLs, and IADL tasks.  Baseline: 08/01/2022: FOTO: 50 Eval: FOTO score: 48 Goal status:Ongoing  5.  Pt. Will write his name with 75% legibility in preparation for signing documents.  Baseline: 08/01/2022: 50% legibility for signing his name. Eval: Pt. signed his name with 50% legibility. Goal status: Ongoing    ASSESSMENT:  CLINICAL IMPRESSION:  Measurements were obtained, and goals were reviewed with the Pt. Pt. has made progress with the TUG improving by 2 points, 5x's STS by 4 sec., 6 min. Walk test by 80 ft. with less shuffling of gait, FOG improved by 2 pt.s, and the BERG improved by 3 points. Pt.'s FOTO score improved by 2 points to 50. Pt. has made progress with form, and technique during the Maximal Daily  Exercises over  this progress reporting period. Pt. has progressed from the adapted version initially to performing most of the exercises in the standard version. Pt.  Continues to work on improving upright posture, stepping over objects, trunk rotation with turning to place items, sit to stand efficiently, increasing arm swing during ambulation, and writing legibility to be able to sign his name. Pt. continues to benefit from OT services for the LSVT BIG program to specifically focus on increasing the amplitude of movements for improved UE functioning, balance, and safety during ADLs, and IADL tasks while reducing overall fall risk.          PERFORMANCE DEFICITS: in functional skills including ADLs, IADLs, coordination, ROM, strength, Fine motor control, and Gross motor control, cognitive skills including memory and problem solving, and psychosocial skills including coping strategies, environmental adaptation, habits, interpersonal interactions, and routines and behaviors.   IMPAIRMENTS: are limiting patient from ADLs, IADLs, and leisure.   CO-MORBIDITIES: may have co-morbidities  that affects occupational performance. Patient will benefit from skilled OT to address above impairments and improve overall function.  MODIFICATION OR ASSISTANCE TO COMPLETE EVALUATION: Min-Moderate modification of tasks or assist with assess necessary to complete an evaluation.  OT OCCUPATIONAL PROFILE AND HISTORY: Detailed assessment: Review of records and additional review of physical, cognitive, psychosocial history related to current functional performance.  CLINICAL DECISION MAKING: Moderate - several treatment options, min-mod task modification necessary  REHAB POTENTIAL: Good  EVALUATION COMPLEXITY: Moderate    PLAN:  OT FREQUENCY: 4x/week  OT DURATION: 6 weeks  PLANNED INTERVENTIONS: self care/ADL training, therapeutic exercise, therapeutic activity, neuromuscular re-education, manual therapy, passive range of motion, functional mobility training, moist heat, patient/family education, cognitive remediation/compensation, and DME and/or AE instructions  RECOMMENDED  OTHER SERVICES: SLP  CONSULTED AND AGREED WITH PLAN OF CARE: Patient and family member/caregiver  PLAN FOR NEXT SESSION:  continue to treatment, and refining MDE's  Harrel Carina, MS, OTR/L   Harrel Carina, OT 08/01/2022, 4:24 PM

## 2022-08-02 ENCOUNTER — Ambulatory Visit: Payer: Medicare HMO | Admitting: Occupational Therapy

## 2022-08-02 ENCOUNTER — Ambulatory Visit: Payer: Medicare HMO | Admitting: Speech Pathology

## 2022-08-02 DIAGNOSIS — R278 Other lack of coordination: Secondary | ICD-10-CM

## 2022-08-02 DIAGNOSIS — R41841 Cognitive communication deficit: Secondary | ICD-10-CM | POA: Diagnosis not present

## 2022-08-02 DIAGNOSIS — M6281 Muscle weakness (generalized): Secondary | ICD-10-CM

## 2022-08-03 ENCOUNTER — Ambulatory Visit: Payer: Medicare HMO | Admitting: Occupational Therapy

## 2022-08-03 ENCOUNTER — Encounter: Payer: Medicare HMO | Admitting: Speech Pathology

## 2022-08-03 DIAGNOSIS — M6281 Muscle weakness (generalized): Secondary | ICD-10-CM

## 2022-08-03 DIAGNOSIS — R278 Other lack of coordination: Secondary | ICD-10-CM

## 2022-08-03 DIAGNOSIS — R41841 Cognitive communication deficit: Secondary | ICD-10-CM | POA: Diagnosis not present

## 2022-08-05 ENCOUNTER — Ambulatory Visit: Payer: Medicare HMO | Admitting: Speech Pathology

## 2022-08-05 ENCOUNTER — Encounter: Payer: Self-pay | Admitting: Occupational Therapy

## 2022-08-05 ENCOUNTER — Ambulatory Visit: Payer: Medicare HMO | Admitting: Occupational Therapy

## 2022-08-05 DIAGNOSIS — M6281 Muscle weakness (generalized): Secondary | ICD-10-CM

## 2022-08-05 DIAGNOSIS — R41841 Cognitive communication deficit: Secondary | ICD-10-CM | POA: Diagnosis not present

## 2022-08-05 DIAGNOSIS — R278 Other lack of coordination: Secondary | ICD-10-CM

## 2022-08-05 NOTE — Therapy (Signed)
OUTPATIENT SPEECH LANGUAGE PATHOLOGY TREATMENT NOTE   Patient Name: Neil Bailey MRN: [id number] DOB:January 21, 1951, 72 y.o., male Today's Date: 08/05/2022   PCP: Emily Filbert, MD REFERRING PROVIDER: Jennings Books, MD  END OF SESSION:   End of Session - 08/05/22 1133     Visit Number 6    Number of Visits 17    Date for SLP Re-Evaluation 09/07/22    Authorization Type [employer] HMO/PPO    Progress Note Due on Visit 10    SLP Start Time 0800    SLP Stop Time  0900    SLP Time Calculation (min) 60 min    Activity Tolerance Patient tolerated treatment well               Past Medical History:  Diagnosis Date   Aortic atherosclerosis ([place]) 03/09/2020   Noted on abdominal CT   Atrial fibrillation ([place])    COVID-19 06/2019   DVT (deep venous thrombosis) (HCC)    Dyspnea    since covid dec 2020   Dysrhythmia    paroxysmal atrial fibrillation   HTN (hypertension)    Lab test positive for detection of COVID-19 virus 06/30/2019   Morbid obesity ([place])    Pedal edema    Renal cyst, acquired, right    Thrombocytopenia ([place])    Past Surgical History:  Procedure Laterality Date   COLONOSCOPY  7-8 years ago   [facility]   COLONOSCOPY WITH PROPOFOL N/A 08/26/2020   Procedure: COLONOSCOPY WITH PROPOFOL;  Surgeon: Toledo, Benay Pike, MD;  Location: [facility] END[facility];  Service: Gastroenterology;  Laterality: N/A;   CYSTOSCOPY Left 04/03/2020   Procedure: CYSTOSCOPY Birdena Crandall;  Surgeon: [name], MD;  Location: [facility] ORS;  Service: Urology;  Laterality: Left;   EYE SURGERY  05/2014, 06/2014   cataracts   HERNIA REPAIR     HYDROCELE EXCISION Right 08/07/2020   Procedure: HYDROCELECTOMY ADULT;  Surgeon: [name], MD;  Location: [facility] ORS;  Service: Urology;  Laterality: Right;   INGUINAL HERNIA REPAIR Right 01/19/2015   Procedure: RIGHT INCARCERATED INGUINAL HERNIA REPAIR;  Surgeon: Christene Lye, MD;  Location: [facility] ORS;  Service: General;  Laterality: Right;    TONSILLECTOMY     XI ROBOTIC ASSISTED INGUINAL HERNIA REPAIR WITH MESH Left 04/03/2020   Procedure: XI ROBOTIC ASSISTED INGUINAL HERNIA REPAIR WITH MESH, possible bilateral;  Surgeon: Jules Husbands, MD;  Location: [facility] ORS;  Service: General;  Laterality: Left;   Patient Active Problem List   Diagnosis Date Noted   SOB (shortness of breath) on exertion 06/23/2020   Pedal edema 06/08/2020   Renal cyst, acquired, right 06/08/2020   Thrombocytopenia ([place]) 02/11/2020   Acute deep vein thrombosis (DVT) of popliteal vein of right lower extremity ([place]) 07/18/2019   History of 2019 novel coronavirus disease (COVID-19) 07/18/2019   Acute respiratory failure with hypoxia ([place]) 07/04/2019   Atrial fibrillation, chronic ([place]) 07/04/2019   Pneumonia due to COVID-19 virus 07/01/2019   CAP (community acquired pneumonia) 06/30/2019   Medicare annual wellness visit, initial 03/20/2017   Morbid obesity with BMI of 40.0-44.9, adult ([place]) 12/29/2016   History of DVT (deep vein thrombosis) 10/17/2013    ONSET DATE: 05/05/2022  REFERRING DIAG: G20.A1 (ICD-10-CM) - Parkinson's disease    THERAPY DIAG:  Cognitive communication deficit   Dysarthria  THERAPY DIAG:  Cognitive communication deficit  Rationale for Evaluation and Treatment Rehabilitation  SUBJECTIVE: Pt appeared in a good mood and motivated to work, stated he was ready to be  done with speech soon.   Pt accompanied by: self  PAIN:  Are you having pain? No  PATIENT GOALS: improve his memory  OBJECTIVE:   TODAY'S TREATMENT:  Skilled treatment session focused on pt's cognitive communication goals. SLP facilitated the session by providing the following interventions: SLP facilitated the use of a deck of cards to work on new working memory, divided attention, and awareness. During the activity the pt required minimal cueing in following the steps of the card game and utilized a strategy of scanning to assist in game play.  Pt was  successful in recalling the rules and set up of the game after an initial first teaching. Client was independent in game play by the end of session.          PATIENT EDUCATION: Education details: see above Person educated: Patient Education method: Education officer, environmental, and Verbal cues Education comprehension: verbalized understanding  HOME EXERCISE PROGRAM:  Put list of medications in his money clip Purchase a calendar and put in bill due dates  GOALS: Goals reviewed with patient? Yes  SHORT TERM GOALS: Target date: 10 sessions   The patient will maximize voice quality and loudness using breath support for sustained vowel production, pitch glides, and hierarchal speech drill. Baseline: Goal status: INITIAL   2.   The patient will demonstrate abdominal breathing patterns and steady release of breath on exhalation to optimize efficiency of voicing and decrease laryngeal hyperfunction Baseline:  Goal status: INITIAL   3.  Patient will report improved communication effectiveness as measured by Communicative Effectiveness Survey.  Baseline:  Goal status: INITIAL   4.  Pt will use external aid for functional recall of completed/upcoming activities 75% accuracy with Min A.  Baseline:  Goal status: INITIAL   5.  Pt will use strategies to improve memory for important information with 75% acc. with minimal assistance (ie., white board, daily planner/calendar, Apps on phone).  Baseline:  Goal status: INITIAL   6.  Pt will complete mod complex money/financial problems 100% accuracy in a reasonable amount of time with double checking, use of strategies and rare min A. Baseline:  Goal status: INITIAL   LONG TERM GOALS: Target date: 09/07/2022 Pt will complete mod complex money/financial problems 100% accuracy in a reasonable amount of time with double checking, use of strategies Baseline:  Goal status: INITIAL   2.  Pt will use strategies to improve memory for important  information with 75% acc. (ie., white board, daily planner/calendar, Apps on phone).  Baseline:  Goal status: INITIAL   3.  Pt will use external aid for functional recall of completed/upcoming activities > 90% accuracy Baseline:  Goal status: INITIAL   4.  Pt will read paragraph length material with rare Min A cues for use of loud, good quality voice.  Baseline:  Goal status: INITIAL    ASSESSMENT:  CLINICAL IMPRESSION:  Pt continues to eagerly participate in ST sessions but uncertain if pt will be motivated to use strategies during functional daily activities.   OBJECTIVE IMPAIRMENTS include memory and dysarthria. These impairments are limiting patient from managing medications, managing appointments, managing finances, household responsibilities, ADLs/IADLs, and effectively communicating at home and in community. Factors affecting potential to achieve goals and functional outcome are ability to learn/carryover information, co-morbidities, and previous level of function. Patient will benefit from skilled SLP services to address above impairments and improve overall function.  REHAB POTENTIAL:   PLAN: SLP FREQUENCY: 2x/week  SLP DURATION: 8 weeks  PLANNED INTERVENTIONS: Environmental controls,  Cognitive reorganization, Internal/external aids, Functional tasks, SLP instruction and feedback, Compensatory strategies, and Patient/family education  Alphonzo Grieve, SLP Graduate Clinician    Happi B. Rutherford Nail, M.S., CCC-SLP, Mining engineer Certified Brain Injury [place]  [place] Office [phone] [place] [phone] Fax [phone]

## 2022-08-05 NOTE — Therapy (Signed)
OUTPATIENT OCCUPATIONAL THERAPY NEURO/LSVT TREATMENT   Patient Name: Neil Bailey MRN: [id number] DOB:22-Jan-1951, 72 y.o., male   PCP: Emily Filbert, MD REFERRING PROVIDER: Jennings Books, MD  END OF SESSION:  OT End of Session - 08/05/22 0845     Visit Number 11    Number of Visits 17    Date for OT Re-Evaluation 08/25/22    Authorization Time Period Progress report period starting 07/14/2022    OT Start Time 1000    OT Stop Time 1059    OT Time Calculation (min) 59 min    Activity Tolerance Patient tolerated treatment well    Behavior During Therapy [facility] for tasks assessed/performed              Past Medical History:  Diagnosis Date   Aortic atherosclerosis ([place]) 03/09/2020   Noted on abdominal CT   Atrial fibrillation ([place])    COVID-19 06/2019   DVT (deep venous thrombosis) ([facility])    Dyspnea    since covid dec 2020   Dysrhythmia    paroxysmal atrial fibrillation   HTN (hypertension)    Lab test positive for detection of COVID-19 virus 06/30/2019   Morbid obesity ([place])    Pedal edema    Renal cyst, acquired, right    Thrombocytopenia ([place])    Past Surgical History:  Procedure Laterality Date   COLONOSCOPY  7-8 years ago   [facility]   COLONOSCOPY WITH PROPOFOL N/A 08/26/2020   Procedure: COLONOSCOPY WITH PROPOFOL;  Surgeon: Toledo, Benay Pike, MD;  Location: [facility] ENDOSCOPY;  Service: Gastroenterology;  Laterality: N/A;   CYSTOSCOPY Left 04/03/2020   Procedure: CYSTOSCOPY Birdena Crandall;  Surgeon: [name], MD;  Location: [facility] ORS;  Service: Urology;  Laterality: Left;   EYE SURGERY  05/2014, 06/2014   cataracts   HERNIA REPAIR     HYDROCELE EXCISION Right 08/07/2020   Procedure: HYDROCELECTOMY ADULT;  Surgeon: [name], MD;  Location: [facility] ORS;  Service: Urology;  Laterality: Right;   INGUINAL HERNIA REPAIR Right 01/19/2015   Procedure: RIGHT INCARCERATED INGUINAL HERNIA REPAIR;  Surgeon: Christene Lye, MD;  Location: [facility] ORS;  Service:  General;  Laterality: Right;   TONSILLECTOMY     XI ROBOTIC ASSISTED INGUINAL HERNIA REPAIR WITH MESH Left 04/03/2020   Procedure: XI ROBOTIC ASSISTED INGUINAL HERNIA REPAIR WITH MESH, possible bilateral;  Surgeon: Jules Husbands, MD;  Location: [facility] ORS;  Service: General;  Laterality: Left;   Patient Active Problem List   Diagnosis Date Noted   SOB (shortness of breath) on exertion 06/23/2020   Pedal edema 06/08/2020   Renal cyst, acquired, right 06/08/2020   Thrombocytopenia ([place]) 02/11/2020   Acute deep vein thrombosis (DVT) of popliteal vein of right lower extremity ([place]) 07/18/2019   History of 2019 novel coronavirus disease (COVID-19) 07/18/2019   Acute respiratory failure with hypoxia ([place]) 07/04/2019   Atrial fibrillation, chronic ([place]) 07/04/2019   Pneumonia due to COVID-19 virus 07/01/2019   CAP (community acquired pneumonia) 06/30/2019   Medicare annual wellness visit, initial 03/20/2017   Morbid obesity with BMI of 40.0-44.9, adult ([place]) 12/29/2016   History of DVT (deep vein thrombosis) 10/17/2013    ONSET DATE: 12/09/2021  REFERRING DIAG: Parkinson's Disease  THERAPY DIAG:  Muscle weakness (generalized)  Other lack of coordination  Rationale for Evaluation and Treatment: Rehabilitation  SUBJECTIVE:   SUBJECTIVE STATEMENT: Discussed with patient the importance of performing maximal daily exercises 2 times a day during the intensive phase of the program to really drive  results.  He reports he will try this week to adhere to the recommendation and do his home program 2 times a day.  Pt reports he is doing well, still has some bilateral knee pain which can limit his movement.     Pt accompanied by: self  PERTINENT HISTORY:  Pt. is a 72 y.o. male who was diagnosed with Parkinson's Disease in June of 2023.   PRECAUTIONS: None  WEIGHT BEARING RESTRICTIONS: No  PAIN:  Are you having pain? Knee pain 5-6/10 right knee pain  FALLS: Has patient fallen in last 6  months? No  LIVING ENVIRONMENT: Lives with: lives with their spouse Lives in: House/apartment Stairs: 3- 4 steps to enter. 2 story home Has following equipment at home: None  PLOF: Independent  PATIENT GOALS: Regain stability  OBJECTIVE:   HAND DOMINANCE: Ambidextrous  ADLs: Overall ADLs:   Transfers/ambulation related to ADLs: Eating:  Tremors, uses a spoon instead of a fork Grooming: Difficulty aligning  toothpaste on the tooth brush, difficulty cutting toenails-Consistently goes to a nail salon to have them done.  UB Dressing:  Independent LB Dressing: Independent, increased time to complete Toileting: Independent Bathing: Independent Tub Shower transfers: Independent    IADLs: Shopping: Independent Light housekeeping: No changes noted  Meal Prep: Does not cook, grills Community mobility:  Driving Independently Medication management: Independent with weekly pillbox Financial management: No changes noted Handwriting: 50% legible Name in cursive writing  MOBILITY STATUS: Independent  POSTURE COMMENTS:   Sitting balance:  Good unsupported sitting balance  ACTIVITY TOLERANCE: Activity tolerance: Pt. requires rest breaks during standing assessments  FUNCTIONAL OUTCOME MEASURES:  FOTO: 48 5x's STS: 18 sec. TUG: 11 sec. Freezing of Gait: 9 BERG Balance Scale: 43/56  08/01/2022  FOTO: 50 5x's STS: 14 sec. TUG: 9 sec. Freezing of Gait: 7 BERG Balance Scale: 46/56; Standing one foot in front of the other: This test was scored with the left foot in front of the right: 0, (Pt. Then attempted with the right foot in front of the left with a score of 4)   07/18/2022:   6 minute walk test: 890 ft. with a seated rest break required at the end.  6 minute walk test: 960 ft. with a seated rest break required at the end.   HAND FUNCTION: Grip strength: Right: 68 lbs; Left: 64 lbs and Lateral pinch: Right: 18 lbs, Left: 18 lbs 3pt. Pinch Right: 18 lbs, Left: 17  lbs  Grip strength: Right: 68 lbs; Left: 58 lbs and Lateral pinch: Right: 18 lbs, Left: 18 lbs 3pt. Pinch Right: 16 lbs, Left: 18 lbs  COORDINATION: 9 Hole Peg test: Right: 33 sec; Left: 27 sec  9 Hole Peg test: Right: 34 sec; Left: 28 sec   SENSATION: WFL  EDEMA: N/A  MUSCLE TONE:  WFL  COGNITION: Overall cognitive status: Pt. reports cognitive/memory changes. Pt. being seen by SLP  VISION: Subjective report:  Pt. reports no changes with vision.  VISION ASSESSMENT:  WFL for tasks assessed  PERCEPTION:  TBA  PRAXIS: Impaired: Motor planning  TODAY'S TREATMENT:    DATE: 08/02/2022    LSVT: Patient seen for LSVT Daily Session Maximal Daily Exercises for facilitation/coordination of movement. Maximum Sustained Movements are designed to rescale the amplitude of movement output for generalization to daily functional activities. Performed as follows for 1 set of 10 repetitions each multi-directional sustained movements: 1) Floor to ceiling- Continued cues for opening the hand fully between each finger flick.  2) Side to side  multidirectional- Pt. required fewer cues for increased hip, and knee extension. Finger flicks performed for right and left sides this date.   3) Step and reach forward step- Cues were required for upright midline posture.The exercise was performed in the standard version without the chair, min guard from therapist. 4) Step and reach sideways step- This exercise was performed in the standard version without the chair, min guard from therapist. 5) Step and reach backwards step- Improved with a larger steps back posteriorly.  Pt. Was able to complete the movements more fluidly. With no cuing required for Iifting of the toes.  This exercise was performed today in standard version with min assist for balance from therapist.  6) [place] and reach forward/backward- This exercise was performed in the standard version without a chair. Pt. required cues to coordinate the  bilateral UE movements.  Min guard provided by therapist for balance however pt may still need to utilize chair when performing at home.  7) Rock and reach sideways- Pt. continues to present with limited rotation through the trunk when turning to look over his shoulders. Pt. required cues for UE extension, and to spread digits. Pt. Performed the task in the standard version without the chair. Manual cues from therapist for improved trunk rotation.    Functional Component Task:   1.Sit to stand BIG functional component task for 5 reps with visual visual demonstration, visual, and tactile cues. Pt. was able to perform the functional component task more fluidly from start to finish. Verbal cues, and visual cues were required for posture, and hand position. 2.Stepping over objects: Min guard to min assist, cues for increasing step height to not knock over items. 3.Increasing Arm swing: Worked on reciprocal arm swing functionally during ambulation this date.  Cues for arm positioning and amplitude of movement patterns. 4:Turning/Elongating the trunk: Reaching tasks bilaterally to facilitate trunk rotation. 5: Formulating large letters for his signature: Pt. worked on large/BIG strokes in sitting with formulation of lists with good legibility this date and improved letter size formation. Pt. worked on bilateral pectoral stretches in standing at the wall followed by sitting. Pt. required cues for visual demonstration, and education about the importance of pectoral stretches in promoting upright midline posture with Parkinson's Disease.   7  hand exercises to help promote handwriting, dexterity and flexibility. 1)  Fisting with arm then arm extension with fingers extended with BIG hand movements 2) oppositional movements of the thumb to each digit 3) wrist flex/ext with elbows extended 4) supination/pronation 5) tendon gliding with hook fist and then finger extension 6) tendon gliding with tabletop  movement of fingers with MP flexion, PIP and DIP extension 7) MP flexion, PIP flexion, DIP extension Performed with therapist demo and occasional cues for form   BIG ambulation:     Pt. performed BIG Ambulation with emphasis placed on increasing amplitude of movements for arm swing, and large, long steps. Pt. was able to complete  a total of 425 ft. Pt. worked on [employer] the whole distance with emphasis placed on armswing, and large steps, and marching with high knees.  PATIENT EDUCATION: Education details: LSVT MDE's, Goals, POC Person educated: Patient Education method: Explanation, Demonstration, Tactile cues, and Verbal cues Education comprehension: verbalized understanding  HOME EXERCISE PROGRAM:  Maximal daily exercises to be performed 2 times a day.    GOALS: Goals reviewed with patient? Yes  SHORT TERM GOALS: Target date: 08/04/2022    Pt. Will be independent with HEPs for Maximal Daily Exercises, including modifications. Baseline: 08/01/2022: Pt. Was provided with the adapted version of MDE initially, and upgraded to the Standard version. Pt. Continues to require cues, and visual demonstration/modeling of the exercises. Eval: No current HEP. Training to begin for the MDE on the next visit. Goal status: INITIAL    LONG TERM GOALS: Target date: 08/25/2022    Pt. Will be independent with the Established LSVT  Functional component, and Hierarchy tasks.  Baseline: 08/01/2022: pt. Requires continued work on the optimizing, and increasing the amplitude for the Functional component, and Hierarchy tasks. Eval: Functional component tasks, and hierarchy tasks to be established Goal status: Ongoing  2.  Pt. Will improve the BERG Balance score by 5 points to reduce fall risk during daily tasks. Baseline: 08/01/2022: BERG: 46/56 Eval: BERG Balance score:  43/56 Goal status: Ongoing    3.  Pt. Will improve the Freezing of Gait score by 3 points.  Baseline: 08/01/2022: FOG score: 7 Eval: FOG score: 9 Goal status: Ongoing  4.  Pt. Increase the FOTO score by 3 points for Pt. perceived improvement with assessment specific ADLs, and IADL tasks.  Baseline: 08/01/2022: FOTO: 50 Eval: FOTO score: 48 Goal status:Ongoing  5.  Pt. Will write his name with 75% legibility in preparation for signing documents.  Baseline: 08/01/2022: 50% legibility for signing his name. Eval: Pt. signed his name with 50% legibility. Goal status: Ongoing    ASSESSMENT:  CLINICAL IMPRESSION: Pt continues to progress with exercises and transitioning from performing in an adapted version to the standard version.  Continue to recommend the patient have a chair next to him when performing exercises which require standing for assist as needed.  Pt continues to demonstrate decreased step height with stepping over objects and when performing functional mobility.  Continue to reinforce importance of maximal daily exercises 2 times a day.  Continue to work towards calibration of movements and increasing amplitude of movement patterns.         PERFORMANCE DEFICITS: in functional skills including ADLs, IADLs, coordination, ROM, strength, Fine motor control, and Gross motor control, cognitive skills including memory and problem solving, and psychosocial skills including coping strategies, environmental adaptation, habits, interpersonal interactions, and routines and behaviors.   IMPAIRMENTS: are limiting patient from ADLs, IADLs, and leisure.   CO-MORBIDITIES: may have co-morbidities  that affects occupational performance. Patient will benefit from skilled OT to address above impairments and improve overall function.  MODIFICATION OR ASSISTANCE TO COMPLETE EVALUATION: Min-Moderate modification of tasks or assist with assess necessary to complete an evaluation.  OT OCCUPATIONAL PROFILE  AND HISTORY: Detailed assessment: Review of records and additional review of physical, cognitive, psychosocial history related to current functional performance.  CLINICAL DECISION MAKING: Moderate - several treatment options, min-mod task modification necessary  REHAB POTENTIAL: Good  EVALUATION COMPLEXITY: Moderate    PLAN:  OT FREQUENCY: 4x/week  OT DURATION: 6 weeks  PLANNED INTERVENTIONS: self care/ADL training, therapeutic exercise, therapeutic activity, neuromuscular re-education, manual therapy, passive range of motion, functional mobility training, moist heat, patient/family education, cognitive remediation/compensation, and DME and/or AE instructions  RECOMMENDED OTHER SERVICES: SLP  CONSULTED  AND AGREED WITH PLAN OF CARE: Patient and family member/caregiver  PLAN FOR NEXT SESSION:  continue to treatment, and refining MDE's  Monik Lins Oneita Jolly, OTR/L, CLT  Tyjai Charbonnet, OT 08/05/2022, 8:48 AM

## 2022-08-06 ENCOUNTER — Encounter: Payer: Self-pay | Admitting: Occupational Therapy

## 2022-08-06 NOTE — Therapy (Signed)
OUTPATIENT OCCUPATIONAL THERAPY NEURO/LSVT TREATMENT   Patient Name: Neil Bailey MRN: [id number] DOB:[birth year], 72 y.o., male   PCP: Emily Filbert, MD REFERRING PROVIDER: Jennings Books, MD  END OF SESSION:  OT End of Session - 08/06/22 2047     Visit Number 12    Number of Visits 17    Date for OT Re-Evaluation 08/25/22    Authorization Time Period Progress report period starting 07/14/2022    OT Start Time 0859    OT Stop Time 1000    OT Time Calculation (min) 61 min    Activity Tolerance Patient tolerated treatment well    Behavior During Therapy [facility] for tasks assessed/performed              Past Medical History:  Diagnosis Date   Aortic atherosclerosis ([place]) 03/09/2020   Noted on abdominal CT   Atrial fibrillation ([place])    COVID-19 06/2019   DVT (deep venous thrombosis) ([facility])    Dyspnea    since covid dec 2020   Dysrhythmia    paroxysmal atrial fibrillation   HTN (hypertension)    Lab test positive for detection of COVID-19 virus 06/30/2019   Morbid obesity ([place])    Pedal edema    Renal cyst, acquired, right    Thrombocytopenia ([place])    Past Surgical History:  Procedure Laterality Date   COLONOSCOPY  7-8 years ago   [facility]   COLONOSCOPY WITH PROPOFOL N/A 08/26/2020   Procedure: COLONOSCOPY WITH PROPOFOL;  Surgeon: Toledo, Benay Pike, MD;  Location: [facility] ENDOSCOPY;  Service: Gastroenterology;  Laterality: N/A;   CYSTOSCOPY Left 04/03/2020   Procedure: CYSTOSCOPY Birdena Crandall;  Surgeon: [name], MD;  Location: [facility] ORS;  Service: Urology;  Laterality: Left;   EYE SURGERY  05/2014, 06/2014   cataracts   HERNIA REPAIR     HYDROCELE EXCISION Right 08/07/2020   Procedure: HYDROCELECTOMY ADULT;  Surgeon: [name], MD;  Location: [facility] ORS;  Service: Urology;  Laterality: Right;   INGUINAL HERNIA REPAIR Right 01/19/2015   Procedure: RIGHT INCARCERATED INGUINAL HERNIA REPAIR;  Surgeon: Christene Lye, MD;  Location: [facility] ORS;  Service:  General;  Laterality: Right;   TONSILLECTOMY     XI ROBOTIC ASSISTED INGUINAL HERNIA REPAIR WITH MESH Left 04/03/2020   Procedure: XI ROBOTIC ASSISTED INGUINAL HERNIA REPAIR WITH MESH, possible bilateral;  Surgeon: Jules Husbands, MD;  Location: [facility] ORS;  Service: General;  Laterality: Left;   Patient Active Problem List   Diagnosis Date Noted   SOB (shortness of breath) on exertion 06/23/2020   Pedal edema 06/08/2020   Renal cyst, acquired, right 06/08/2020   Thrombocytopenia ([place]) 02/11/2020   Acute deep vein thrombosis (DVT) of popliteal vein of right lower extremity ([place]) 07/18/2019   History of 2019 novel coronavirus disease (COVID-19) 07/18/2019   Acute respiratory failure with hypoxia ([place]) 07/04/2019   Atrial fibrillation, chronic ([place]) 07/04/2019   Pneumonia due to COVID-19 virus 07/01/2019   CAP (community acquired pneumonia) 06/30/2019   Medicare annual wellness visit, initial 03/20/2017   Morbid obesity with BMI of 40.0-44.9, adult ([place]) 12/29/2016   History of DVT (deep vein thrombosis) 10/17/2013    ONSET DATE: 12/09/2021  REFERRING DIAG: Parkinson's Disease  THERAPY DIAG:  Muscle weakness (generalized)  Other lack of coordination  Rationale for Evaluation and Treatment: Rehabilitation  SUBJECTIVE:   SUBJECTIVE STATEMENT: Patient reports he did one set of exercises yesterday and one set each day over the previous weekend. Discussed the importance of engaging in  maximal daily exercises two times a day during the intensive portion of therapy. Patient reports he understands and will continue to try to complete as directed.  Pt accompanied by: self  PERTINENT HISTORY:  Pt. is a 72 y.o. male who was diagnosed with Parkinson's Disease in June of 2023.   PRECAUTIONS: None  WEIGHT BEARING RESTRICTIONS: No  PAIN:  Are you having pain? Knee pain 5-6/10 right knee pain  FALLS: Has patient fallen in last 6 months? No  LIVING ENVIRONMENT: Lives with: lives with  their spouse Lives in: House/apartment Stairs: 3- 4 steps to enter. 2 story home Has following equipment at home: None  PLOF: Independent  PATIENT GOALS: Regain stability  OBJECTIVE:   HAND DOMINANCE: Ambidextrous  ADLs: Overall ADLs:   Transfers/ambulation related to ADLs: Eating:  Tremors, uses a spoon instead of a fork Grooming: Difficulty aligning  toothpaste on the tooth brush, difficulty cutting toenails-Consistently goes to a nail salon to have them done.  UB Dressing:  Independent LB Dressing: Independent, increased time to complete Toileting: Independent Bathing: Independent Tub Shower transfers: Independent    IADLs: Shopping: Independent Light housekeeping: No changes noted  Meal Prep: Does not cook, grills Community mobility:  Driving Independently Medication management: Independent with weekly pillbox Financial management: No changes noted Handwriting: 50% legible Name in cursive writing  MOBILITY STATUS: Independent  POSTURE COMMENTS:   Sitting balance:  Good unsupported sitting balance  ACTIVITY TOLERANCE: Activity tolerance: Pt. requires rest breaks during standing assessments  FUNCTIONAL OUTCOME MEASURES:  FOTO: 48 5x's STS: 18 sec. TUG: 11 sec. Freezing of Gait: 9 BERG Balance Scale: 43/56  08/01/2022  FOTO: 50 5x's STS: 14 sec. TUG: 9 sec. Freezing of Gait: 7 BERG Balance Scale: 46/56; Standing one foot in front of the other: This test was scored with the left foot in front of the right: 0, (Pt. Then attempted with the right foot in front of the left with a score of 4)   07/18/2022:   6 minute walk test: 890 ft. with a seated rest break required at the end.  6 minute walk test: 960 ft. with a seated rest break required at the end.   HAND FUNCTION: Grip strength: Right: 68 lbs; Left: 64 lbs and Lateral pinch: Right: 18 lbs, Left: 18 lbs 3pt. Pinch Right: 18 lbs, Left: 17 lbs  Grip strength: Right: 68 lbs; Left: 58 lbs and Lateral  pinch: Right: 18 lbs, Left: 18 lbs 3pt. Pinch Right: 16 lbs, Left: 18 lbs  COORDINATION: 9 Hole Peg test: Right: 33 sec; Left: 27 sec  9 Hole Peg test: Right: 34 sec; Left: 28 sec   SENSATION: WFL  EDEMA: N/A  MUSCLE TONE:  WFL  COGNITION: Overall cognitive status: Pt. reports cognitive/memory changes. Pt. being seen by SLP  VISION: Subjective report:  Pt. reports no changes with vision.  VISION ASSESSMENT:  WFL for tasks assessed  PERCEPTION:  TBA  PRAXIS: Impaired: Motor planning  TODAY'S TREATMENT:    DATE: 08/03/2022    LSVT: Patient seen for LSVT Daily Session Maximal Daily Exercises for facilitation/coordination of movement. Maximum Sustained Movements are designed to rescale the amplitude of movement output for generalization to daily functional activities. Performed as follows for 1 set of 10 repetitions each multi-directional sustained movements: 1) Floor to ceiling- Continued cues for opening the hand fully between each finger flick.  2) Side to side multidirectional- Pt. required fewer cues for increased hip, and knee extension. Finger flicks performed for right and left  sides this date.   3) Step and reach forward step- Cues were required for upright midline posture.The exercise was performed in the standard version without the chair, min guard from therapist. 4) Step and reach sideways step- This exercise was performed in the standard version without the chair, min guard from therapist. 5) Step and reach backwards step- Improved with a larger steps back posteriorly.  Pt. Was able to complete the movements more fluidly. With no cuing required for Iifting of the toes.  This exercise was performed today in standard version with min assist for balance from therapist.  6) [place] and reach forward/backward- This exercise was performed in the standard version without a chair. Pt. required cues to coordinate the bilateral UE movements.  Min guard provided by therapist for  balance however pt may still need to utilize chair when performing at home.  7) Rock and reach sideways- Pt. continues to present with limited rotation through the trunk when turning to look over his shoulders. Pt. required cues for UE extension, and to spread digits. Pt. Performed the task in the standard version without the chair. Manual cues from therapist for improved trunk rotation.    Functional Component Task:   1.Sit to stand BIG functional component task for 5 reps with visual visual demonstration, visual, and tactile cues. Pt. was able to perform the functional component task more fluidly from start to finish. Verbal cues, and visual cues were required for posture, and hand position. 2.Stepping over objects: Min guard to min assist, cues for increasing step height to not knock over items. 3.Increasing Arm swing: Worked on reciprocal arm swing functionally during ambulation this date.  Cues for arm positioning and amplitude of movement patterns. 4:Turning/Elongating the trunk: Reaching tasks bilaterally to facilitate trunk rotation. 5: Formulating large letters for his signature: Pt. worked on large/BIG strokes in sitting with formulation of lists with good legibility this date and improved letter size formation. Pt. worked on bilateral pectoral stretches in standing at the wall followed by sitting. Pt. required cues for visual demonstration, and education about the importance of pectoral stretches in promoting upright midline posture with Parkinson's Disease.   7  hand exercises to help promote handwriting, dexterity and flexibility. 1)  Fisting with arm then arm extension with fingers extended with BIG hand movements 2) oppositional movements of the thumb to each digit 3) wrist flex/ext with elbows extended 4) supination/pronation 5) tendon gliding with hook fist and then finger extension 6) tendon gliding with tabletop movement of fingers with MP flexion, PIP and DIP extension 7) MP  flexion, PIP flexion, DIP extension Performed with therapist demo and occasional cues for form   BIG ambulation:     Pt. performed BIG Ambulation with emphasis placed on increasing amplitude of movements for arm swing, and large, long steps. Pt. was able to complete  a total of 475 ft. Pt. worked on [employer] the whole distance with emphasis placed on arm swing, and large steps, and posture.  PATIENT EDUCATION: Education details: LSVT MDE's, Goals, POC Person educated: Patient Education method: Explanation, Demonstration, Tactile cues, and Verbal cues Education comprehension: verbalized understanding  HOME EXERCISE PROGRAM:  Maximal daily exercises to be performed 2 times a day.    GOALS: Goals reviewed with patient? Yes  SHORT TERM GOALS: Target date: 08/04/2022    Pt. Will be independent with HEPs for Maximal Daily Exercises, including modifications. Baseline: 08/01/2022: Pt. Was provided with the adapted version of MDE initially, and upgraded to the Standard version. Pt. Continues to require cues, and visual demonstration/modeling of the exercises. Eval: No current HEP. Training to begin for the MDE on the next visit. Goal status: INITIAL    LONG TERM GOALS: Target date: 08/25/2022    Pt. Will be independent with the Established LSVT  Functional component, and Hierarchy tasks.  Baseline: 08/01/2022: pt. Requires continued work on the optimizing, and increasing the amplitude for the Functional component, and Hierarchy tasks. Eval: Functional component tasks, and hierarchy tasks to be established Goal status: Ongoing  2.  Pt. Will improve the BERG Balance score by 5 points to reduce fall risk during daily tasks. Baseline: 08/01/2022: BERG: 46/56 Eval: BERG Balance score: 43/56 Goal status: Ongoing    3.  Pt. Will improve the Freezing of Gait score by  3 points.  Baseline: 08/01/2022: FOG score: 7 Eval: FOG score: 9 Goal status: Ongoing  4.  Pt. Increase the FOTO score by 3 points for Pt. perceived improvement with assessment specific ADLs, and IADL tasks.  Baseline: 08/01/2022: FOTO: 50 Eval: FOTO score: 48 Goal status:Ongoing  5.  Pt. Will write his name with 75% legibility in preparation for signing documents.  Baseline: 08/01/2022: 50% legibility for signing his name. Eval: Pt. signed his name with 50% legibility. Goal status: Ongoing    ASSESSMENT:  CLINICAL IMPRESSION: Patient continues to progress well with tasks as noted in his recent update. He continues to require verbal and tactile cues at times for select exercises when performing the second exercises, sideways reach, he requires cues for positioning of self to prepare for the exercise in the proper form, he demonstrates ability to extend his leg back with further hip extension over time. He continues to demonstrate difficulties with balance, when engaging in tasks, which require stepping over of objects. These tasks require min guard to min assist for safety. Patient is beginning to recall half of the functional hand exercises with minimal cues for correct form and technique. Continue to work towards goals and plan of care to improve strength, range of motion, balance, functional mobility, and transfers for daily activities at home and in the community.       PERFORMANCE DEFICITS: in functional skills including ADLs, IADLs, coordination, ROM, strength, Fine motor control, and Gross motor control, cognitive skills including memory and problem solving, and psychosocial skills including coping strategies, environmental adaptation, habits, interpersonal interactions, and routines and behaviors.   IMPAIRMENTS: are limiting patient from ADLs, IADLs, and leisure.   CO-MORBIDITIES: may have co-morbidities  that affects occupational performance. Patient will benefit from skilled OT to  address above impairments and improve overall function.  MODIFICATION OR ASSISTANCE TO COMPLETE EVALUATION: Min-Moderate modification of tasks or assist with assess necessary to complete an evaluation.  OT OCCUPATIONAL PROFILE AND HISTORY: Detailed assessment: Review of records and additional review of physical, cognitive, psychosocial history related to current functional performance.  CLINICAL DECISION MAKING: Moderate - several treatment options, min-mod task modification necessary  REHAB POTENTIAL: Good  EVALUATION COMPLEXITY: Moderate  PLAN:  OT FREQUENCY: 4x/week  OT DURATION: 6 weeks  PLANNED INTERVENTIONS: self care/ADL training, therapeutic exercise, therapeutic activity, neuromuscular re-education, manual therapy, passive range of motion, functional mobility training, moist heat, patient/family education, cognitive remediation/compensation, and DME and/or AE instructions  RECOMMENDED OTHER SERVICES: SLP  CONSULTED AND AGREED WITH PLAN OF CARE: Patient and family member/caregiver  PLAN FOR NEXT SESSION:  continue to treatment, and refining MDE's  Achilles Dunk, OTR/L, CLT  Celise Bazar, OT 08/06/2022, 8:48 PM

## 2022-08-06 NOTE — Therapy (Signed)
OUTPATIENT OCCUPATIONAL THERAPY NEURO/LSVT TREATMENT   Patient Name: Neil Bailey MRN: [id number] DOB:1951-02-28, 72 y.o., male   PCP: Emily Filbert, MD REFERRING PROVIDER: Jennings Books, MD  END OF SESSION:  OT End of Session - 08/06/22 2054     Visit Number 13    Number of Visits 17    Date for OT Re-Evaluation 08/25/22    Authorization Time Period Progress report period starting 07/14/2022    OT Start Time 0902    OT Stop Time 1005    OT Time Calculation (min) 63 min    Activity Tolerance Patient tolerated treatment well    Behavior During Therapy [facility] for tasks assessed/performed              Past Medical History:  Diagnosis Date   Aortic atherosclerosis ([place]) 03/09/2020   Noted on abdominal CT   Atrial fibrillation ([place])    COVID-19 06/2019   DVT (deep venous thrombosis) ([facility])    Dyspnea    since covid dec 2020   Dysrhythmia    paroxysmal atrial fibrillation   HTN (hypertension)    Lab test positive for detection of COVID-19 virus 06/30/2019   Morbid obesity ([place])    Pedal edema    Renal cyst, acquired, right    Thrombocytopenia ([place])    Past Surgical History:  Procedure Laterality Date   COLONOSCOPY  7-8 years ago   [facility]   COLONOSCOPY WITH PROPOFOL N/A 08/26/2020   Procedure: COLONOSCOPY WITH PROPOFOL;  Surgeon: Toledo, Benay Pike, MD;  Location: [facility] ENDOSCOPY;  Service: Gastroenterology;  Laterality: N/A;   CYSTOSCOPY Left 04/03/2020   Procedure: CYSTOSCOPY Birdena Crandall;  Surgeon: [name], MD;  Location: [facility] ORS;  Service: Urology;  Laterality: Left;   EYE SURGERY  05/2014, 06/2014   cataracts   HERNIA REPAIR     HYDROCELE EXCISION Right 08/07/2020   Procedure: HYDROCELECTOMY ADULT;  Surgeon: [name], MD;  Location: [facility] ORS;  Service: Urology;  Laterality: Right;   INGUINAL HERNIA REPAIR Right 01/19/2015   Procedure: RIGHT INCARCERATED INGUINAL HERNIA REPAIR;  Surgeon: Christene Lye, MD;  Location: [facility] ORS;  Service:  General;  Laterality: Right;   TONSILLECTOMY     XI ROBOTIC ASSISTED INGUINAL HERNIA REPAIR WITH MESH Left 04/03/2020   Procedure: XI ROBOTIC ASSISTED INGUINAL HERNIA REPAIR WITH MESH, possible bilateral;  Surgeon: Jules Husbands, MD;  Location: [facility] ORS;  Service: General;  Laterality: Left;   Patient Active Problem List   Diagnosis Date Noted   SOB (shortness of breath) on exertion 06/23/2020   Pedal edema 06/08/2020   Renal cyst, acquired, right 06/08/2020   Thrombocytopenia ([place]) 02/11/2020   Acute deep vein thrombosis (DVT) of popliteal vein of right lower extremity ([place]) 07/18/2019   History of 2019 novel coronavirus disease (COVID-19) 07/18/2019   Acute respiratory failure with hypoxia ([place]) 07/04/2019   Atrial fibrillation, chronic ([place]) 07/04/2019   Pneumonia due to COVID-19 virus 07/01/2019   CAP (community acquired pneumonia) 06/30/2019   Medicare annual wellness visit, initial 03/20/2017   Morbid obesity with BMI of 40.0-44.9, adult ([place]) 12/29/2016   History of DVT (deep vein thrombosis) 10/17/2013    ONSET DATE: 12/09/2021  REFERRING DIAG: Parkinson's Disease  THERAPY DIAG:  Muscle weakness (generalized)  Other lack of coordination  Rationale for Evaluation and Treatment: Rehabilitation  SUBJECTIVE:   SUBJECTIVE STATEMENT: Patient reports he did his exercises a second session one evening this week. Continued knee, pain bilaterally and unchanged from previous sessions.  Pt  accompanied by: self  PERTINENT HISTORY:  Pt. is a 72 y.o. male who was diagnosed with Parkinson's Disease in June of 2023.   PRECAUTIONS: None  WEIGHT BEARING RESTRICTIONS: No  PAIN:  Are you having pain? Knee pain 5-6/10 right knee pain  FALLS: Has patient fallen in last 6 months? No  LIVING ENVIRONMENT: Lives with: lives with their spouse Lives in: House/apartment Stairs: 3- 4 steps to enter. 2 story home Has following equipment at home: None  PLOF: Independent  PATIENT  GOALS: Regain stability  OBJECTIVE:   HAND DOMINANCE: Ambidextrous  ADLs: Overall ADLs:   Transfers/ambulation related to ADLs: Eating:  Tremors, uses a spoon instead of a fork Grooming: Difficulty aligning  toothpaste on the tooth brush, difficulty cutting toenails-Consistently goes to a nail salon to have them done.  UB Dressing:  Independent LB Dressing: Independent, increased time to complete Toileting: Independent Bathing: Independent Tub Shower transfers: Independent    IADLs: Shopping: Independent Light housekeeping: No changes noted  Meal Prep: Does not cook, grills Community mobility:  Driving Independently Medication management: Independent with weekly pillbox Financial management: No changes noted Handwriting: 50% legible Name in cursive writing  MOBILITY STATUS: Independent  POSTURE COMMENTS:   Sitting balance:  Good unsupported sitting balance  ACTIVITY TOLERANCE: Activity tolerance: Pt. requires rest breaks during standing assessments  FUNCTIONAL OUTCOME MEASURES:  FOTO: 48 5x's STS: 18 sec. TUG: 11 sec. Freezing of Gait: 9 BERG Balance Scale: 43/56  08/01/2022  FOTO: 50 5x's STS: 14 sec. TUG: 9 sec. Freezing of Gait: 7 BERG Balance Scale: 46/56; Standing one foot in front of the other: This test was scored with the left foot in front of the right: 0, (Pt. Then attempted with the right foot in front of the left with a score of 4)   07/18/2022:   6 minute walk test: 890 ft. with a seated rest break required at the end.  6 minute walk test: 960 ft. with a seated rest break required at the end.   HAND FUNCTION: Grip strength: Right: 68 lbs; Left: 64 lbs and Lateral pinch: Right: 18 lbs, Left: 18 lbs 3pt. Pinch Right: 18 lbs, Left: 17 lbs  Grip strength: Right: 68 lbs; Left: 58 lbs and Lateral pinch: Right: 18 lbs, Left: 18 lbs 3pt. Pinch Right: 16 lbs, Left: 18 lbs  COORDINATION: 9 Hole Peg test: Right: 33 sec; Left: 27 sec  9 Hole Peg  test: Right: 34 sec; Left: 28 sec   SENSATION: WFL  EDEMA: N/A  MUSCLE TONE:  WFL  COGNITION: Overall cognitive status: Pt. reports cognitive/memory changes. Pt. being seen by SLP  VISION: Subjective report:  Pt. reports no changes with vision.  VISION ASSESSMENT:  WFL for tasks assessed  PERCEPTION:  TBA  PRAXIS: Impaired: Motor planning  TODAY'S TREATMENT:    DATE: 08/05/2022    LSVT: Patient seen for LSVT Daily Session Maximal Daily Exercises for facilitation/coordination of movement. Maximum Sustained Movements are designed to rescale the amplitude of movement output for generalization to daily functional activities. Performed as follows for 1 set of 10 repetitions each multi-directional sustained movements: 1) Floor to ceiling- Continued cues for opening the hand fully between each finger flick. Pt with greater ROM when reaching down to the floor this week.  2) Side to side multidirectional-  Manual cues for positioning of self on the edge of the surface to be able to complete exercise with proper form.  Finger flicks performed for right and left sides this date.  3) Step and reach forward step- Cues were required for upright midline posture.The exercise was performed in the standard version without the chair, min guard from therapist. 4) Step and reach sideways step- This exercise was performed in the standard version without the chair, min guard from therapist. 5) Step and reach backwards step- Improved with a larger steps back posteriorly.  Pt. Was able to complete the movements more fluidly. This exercise was performed today in standard version with min assist for balance from therapist. One loss of balance with therapist assist for recovery. 6) Rock and reach forward/backward- This exercise was performed in the standard version without a chair. Pt. required cues to coordinate the bilateral UE movements.  Min guard provided by therapist for balance however pt will still  need to utilize chair when performing at home.  7) Rock and reach sideways- Pt. continues to present with limited rotation through the trunk when turning to look over his shoulders. Pt. required cues for UE extension, and to spread digits. Pt. Performed the task in the standard version without the chair. Manual cues from therapist for improved trunk rotation.    Functional Component Task:   1.Sit to stand BIG functional component task for 5 reps with visual visual demonstration, visual, and tactile cues. Pt. was able to perform the functional component task more fluidly from start to finish. Verbal cues, and visual cues were required for posture, and hand position. 2.Stepping over objects: Min guard to min assist, cues for increasing step height to not knock over items.  Pt continues to knock over items frequently with decreased step height. Performed stepping over items, tapping each foot on top of item with alternating sides.   3.Increasing Arm swing: Worked on reciprocal arm swing functionally during ambulation this date.  Cues for arm positioning and amplitude of movement patterns. 4:Turning/Elongating the trunk: Reaching tasks bilaterally to facilitate trunk rotation. 5: Formulating large letters for his signature: Pt. worked on large/BIG strokes in sitting with formulation of lists with good legibility this date and improved letter size formation. Pt. worked on bilateral pectoral stretches in standing at the wall followed by sitting. Pt. required cues for visual demonstration, and education about the importance of pectoral stretches in promoting upright midline posture with Parkinson's Disease.   7  hand exercises to help promote handwriting, dexterity and flexibility. 1)  Fisting with arm then arm extension with fingers extended with BIG hand movements 2) oppositional movements of the thumb to each digit 3) wrist flex/ext with elbows extended 4) supination/pronation 5) tendon gliding with  hook fist and then finger extension 6) tendon gliding with tabletop movement of fingers with MP flexion, PIP and DIP extension 7) MP flexion, PIP flexion, DIP extension Performed with therapist demo and occasional cues for form, patient able to recall 4 of 7 exercises this date and is familiar with the remaining when cued.    BIG ambulation:     Pt. performed BIG Ambulation with emphasis placed on increasing amplitude of movements for arm swing, and large, long steps. Pt. was able to complete  a total of 300 ft. Pt. worked on [employer] the whole distance with emphasis placed on arm swing, and large steps, and posture.  PATIENT EDUCATION: Education details: LSVT MDE's, Goals, POC Person educated: Patient Education method: Explanation, Demonstration, Tactile cues, and Verbal cues Education comprehension: verbalized understanding  HOME EXERCISE PROGRAM:  Maximal daily exercises to be performed 2 times a day.    GOALS: Goals reviewed with patient? Yes  SHORT TERM GOALS: Target date: 08/04/2022    Pt. Will be independent with HEPs for Maximal Daily Exercises, including modifications. Baseline: 08/01/2022: Pt. Was provided with the adapted version of MDE initially, and upgraded to the Standard version. Pt. Continues to require cues, and visual demonstration/modeling of the exercises. Eval: No current HEP. Training to begin for the MDE on the next visit. Goal status: INITIAL    LONG TERM GOALS: Target date: 08/25/2022    Pt. Will be independent with the Established LSVT  Functional component, and Hierarchy tasks.  Baseline: 08/01/2022: pt. Requires continued work on the optimizing, and increasing the amplitude for the Functional component, and Hierarchy tasks. Eval: Functional component tasks, and hierarchy tasks to be established Goal status: Ongoing  2.  Pt.  Will improve the BERG Balance score by 5 points to reduce fall risk during daily tasks. Baseline: 08/01/2022: BERG: 46/56 Eval: BERG Balance score: 43/56 Goal status: Ongoing    3.  Pt. Will improve the Freezing of Gait score by 3 points.  Baseline: 08/01/2022: FOG score: 7 Eval: FOG score: 9 Goal status: Ongoing  4.  Pt. Increase the FOTO score by 3 points for Pt. perceived improvement with assessment specific ADLs, and IADL tasks.  Baseline: 08/01/2022: FOTO: 50 Eval: FOTO score: 48 Goal status:Ongoing  5.  Pt. Will write his name with 75% legibility in preparation for signing documents.  Baseline: 08/01/2022: 50% legibility for signing his name. Eval: Pt. signed his name with 50% legibility. Goal status: Ongoing    ASSESSMENT:  CLINICAL IMPRESSION: Patient continues to progress well with transitioning from performing exercises in an adapted version to a standard version with min guard to min assist. Cues for increased amplitude of movement during rock and reach forward, as well as manual cues for sideways rock and reach for increased trunk rotation. Pt with one loss of balance this date during backwards step and required therapist assist for recovery.  Pt continues to demo difficulty with stepping over objects and frequently knocking over cones and tends to circumduct foot around object rather than over.  Patient continues to benefit from participation in intensive portion of LVT big and continues to demonstrate progress. Patient needs to work towards being consistent with performing home exercise program.    PERFORMANCE DEFICITS: in functional skills including ADLs, IADLs, coordination, ROM, strength, Fine motor control, and Gross motor control, cognitive skills including memory and problem solving, and psychosocial skills including coping strategies, environmental adaptation, habits, interpersonal interactions, and routines and behaviors.   IMPAIRMENTS: are limiting patient from ADLs,  IADLs, and leisure.   CO-MORBIDITIES: may have co-morbidities  that affects occupational performance. Patient will benefit from skilled OT to address above impairments and improve overall function.  MODIFICATION OR ASSISTANCE TO COMPLETE EVALUATION: Min-Moderate modification of tasks or assist with assess necessary to complete an evaluation.  OT OCCUPATIONAL PROFILE AND HISTORY: Detailed assessment: Review of records and additional review of physical, cognitive, psychosocial history related to current functional performance.  CLINICAL DECISION MAKING: Moderate - several treatment options, min-mod task modification necessary  REHAB POTENTIAL: Good  EVALUATION COMPLEXITY: Moderate    PLAN:  OT FREQUENCY: 4x/week  OT DURATION: 6 weeks  PLANNED INTERVENTIONS: self care/ADL training, therapeutic  exercise, therapeutic activity, neuromuscular re-education, manual therapy, passive range of motion, functional mobility training, moist heat, patient/family education, cognitive remediation/compensation, and DME and/or AE instructions  RECOMMENDED OTHER SERVICES: SLP  CONSULTED AND AGREED WITH PLAN OF CARE: Patient and family member/caregiver  PLAN FOR NEXT SESSION:  continue to treatment, and refining MDE's  Birttany Dechellis Oneita Jolly, OTR/L, CLT  Torey Reinard, OT 08/06/2022, 8:55 PM

## 2022-08-08 ENCOUNTER — Ambulatory Visit: Payer: Medicare HMO | Admitting: Occupational Therapy

## 2022-08-08 ENCOUNTER — Ambulatory Visit: Payer: Medicare HMO | Admitting: Speech Pathology

## 2022-08-08 DIAGNOSIS — R278 Other lack of coordination: Secondary | ICD-10-CM

## 2022-08-08 DIAGNOSIS — M6281 Muscle weakness (generalized): Secondary | ICD-10-CM

## 2022-08-08 DIAGNOSIS — R41841 Cognitive communication deficit: Secondary | ICD-10-CM

## 2022-08-08 NOTE — Therapy (Addendum)
OUTPATIENT OCCUPATIONAL THERAPY NEURO/LSVT TREATMENT   Patient Name: Neil Bailey MRN: [id number] DOB:07-27-1950, 72 y.o., male   PCP: Emily Filbert, MD REFERRING PROVIDER: Jennings Books, MD  END OF SESSION:  OT End of Session - 08/08/22 1210     Visit Number 14    Number of Visits 17    Date for OT Re-Evaluation 08/25/22    Authorization Time Period Progress report period starting 07/14/2022    OT Start Time 0900    OT Stop Time 0957    OT Time Calculation (min) 57 min    Activity Tolerance Patient tolerated treatment well    Behavior During Therapy [facility] for tasks assessed/performed              Past Medical History:  Diagnosis Date   Aortic atherosclerosis ([place]) 03/09/2020   Noted on abdominal CT   Atrial fibrillation ([place])    COVID-19 06/2019   DVT (deep venous thrombosis) ([facility])    Dyspnea    since covid dec 2020   Dysrhythmia    paroxysmal atrial fibrillation   HTN (hypertension)    Lab test positive for detection of COVID-19 virus 06/30/2019   Morbid obesity ([place])    Pedal edema    Renal cyst, acquired, right    Thrombocytopenia ([place])    Past Surgical History:  Procedure Laterality Date   COLONOSCOPY  7-8 years ago   [facility]   COLONOSCOPY WITH PROPOFOL N/A 08/26/2020   Procedure: COLONOSCOPY WITH PROPOFOL;  Surgeon: Toledo, Benay Pike, MD;  Location: [facility] ENDOSCOPY;  Service: Gastroenterology;  Laterality: N/A;   CYSTOSCOPY Left 04/03/2020   Procedure: CYSTOSCOPY Birdena Crandall;  Surgeon: [name], MD;  Location: [facility] ORS;  Service: Urology;  Laterality: Left;   EYE SURGERY  05/2014, 06/2014   cataracts   HERNIA REPAIR     HYDROCELE EXCISION Right 08/07/2020   Procedure: HYDROCELECTOMY ADULT;  Surgeon: [name], MD;  Location: [facility] ORS;  Service: Urology;  Laterality: Right;   INGUINAL HERNIA REPAIR Right 01/19/2015   Procedure: RIGHT INCARCERATED INGUINAL HERNIA REPAIR;  Surgeon: Christene Lye, MD;  Location: [facility] ORS;  Service:  General;  Laterality: Right;   TONSILLECTOMY     XI ROBOTIC ASSISTED INGUINAL HERNIA REPAIR WITH MESH Left 04/03/2020   Procedure: XI ROBOTIC ASSISTED INGUINAL HERNIA REPAIR WITH MESH, possible bilateral;  Surgeon: Jules Husbands, MD;  Location: [facility] ORS;  Service: General;  Laterality: Left;   Patient Active Problem List   Diagnosis Date Noted   SOB (shortness of breath) on exertion 06/23/2020   Pedal edema 06/08/2020   Renal cyst, acquired, right 06/08/2020   Thrombocytopenia ([place]) 02/11/2020   Acute deep vein thrombosis (DVT) of popliteal vein of right lower extremity ([place]) 07/18/2019   History of 2019 novel coronavirus disease (COVID-19) 07/18/2019   Acute respiratory failure with hypoxia ([place]) 07/04/2019   Atrial fibrillation, chronic ([place]) 07/04/2019   Pneumonia due to COVID-19 virus 07/01/2019   CAP (community acquired pneumonia) 06/30/2019   Medicare annual wellness visit, initial 03/20/2017   Morbid obesity with BMI of 40.0-44.9, adult ([place]) 12/29/2016   History of DVT (deep vein thrombosis) 10/17/2013    ONSET DATE: 12/09/2021  REFERRING DIAG: Parkinson's Disease  THERAPY DIAG:  Muscle weakness (generalized)  Other lack of coordination  Rationale for Evaluation and Treatment: Rehabilitation  SUBJECTIVE:   SUBJECTIVE STATEMENT: Patient reports he did his exercises a second session one evening this week. Continued knee, pain bilaterally and unchanged from previous sessions.  Pt  accompanied by: self  PERTINENT HISTORY:  Pt. is a 72 y.o. male who was diagnosed with Parkinson's Disease in June of 2023.   PRECAUTIONS: None  WEIGHT BEARING RESTRICTIONS: No  PAIN:  Are you having pain? Knee pain 5-6/10 right knee pain  FALLS: Has patient fallen in last 6 months? No  LIVING ENVIRONMENT: Lives with: lives with their spouse Lives in: House/apartment Stairs: 3- 4 steps to enter. 2 story home Has following equipment at home: None  PLOF: Independent  PATIENT  GOALS: Regain stability  OBJECTIVE:   HAND DOMINANCE: Ambidextrous  ADLs: Overall ADLs:   Transfers/ambulation related to ADLs: Eating:  Tremors, uses a spoon instead of a fork Grooming: Difficulty aligning  toothpaste on the tooth brush, difficulty cutting toenails-Consistently goes to a nail salon to have them done.  UB Dressing:  Independent LB Dressing: Independent, increased time to complete Toileting: Independent Bathing: Independent Tub Shower transfers: Independent    IADLs: Shopping: Independent Light housekeeping: No changes noted  Meal Prep: Does not cook, grills Community mobility:  Driving Independently Medication management: Independent with weekly pillbox Financial management: No changes noted Handwriting: 50% legible Name in cursive writing  MOBILITY STATUS: Independent  POSTURE COMMENTS:   Sitting balance:  Good unsupported sitting balance  ACTIVITY TOLERANCE: Activity tolerance: Pt. requires rest breaks during standing assessments  FUNCTIONAL OUTCOME MEASURES:  FOTO: 48 5x's STS: 18 sec. TUG: 11 sec. Freezing of Gait: 9 BERG Balance Scale: 43/56  08/01/2022  FOTO: 50 5x's STS: 14 sec. TUG: 9 sec. Freezing of Gait: 7 BERG Balance Scale: 46/56; Standing one foot in front of the other: This test was scored with the left foot in front of the right: 0, (Pt. Then attempted with the right foot in front of the left with a score of 4)   07/18/2022:   6 minute walk test: 890 ft. with a seated rest break required at the end.  6 minute walk test: 960 ft. with a seated rest break required at the end.   HAND FUNCTION: Grip strength: Right: 68 lbs; Left: 64 lbs and Lateral pinch: Right: 18 lbs, Left: 18 lbs 3pt. Pinch Right: 18 lbs, Left: 17 lbs  Grip strength: Right: 68 lbs; Left: 58 lbs and Lateral pinch: Right: 18 lbs, Left: 18 lbs 3pt. Pinch Right: 16 lbs, Left: 18 lbs  COORDINATION: 9 Hole Peg test: Right: 33 sec; Left: 27 sec  9 Hole Peg  test: Right: 34 sec; Left: 28 sec   SENSATION: WFL  EDEMA: N/A  MUSCLE TONE:  WFL  COGNITION: Overall cognitive status: Pt. reports cognitive/memory changes. Pt. being seen by SLP  VISION: Subjective report:  Pt. reports no changes with vision.  VISION ASSESSMENT:  WFL for tasks assessed  PERCEPTION:  TBA  PRAXIS: Impaired: Motor planning  TODAY'S TREATMENT:    DATE: 08/08/2022    LSVT: Patient seen for LSVT Daily Session Maximal Daily Exercises for facilitation/coordination of movement Maximum Sustained Movements are designed to rescale the amplitude of movement output for generalization to daily functional activities. Performed as follows for 1 set of 10 repetitions each multi-directional sustained movements: 1) Floor to ceiling- fewer cues were required for form. Cues were required for opening the hand fully between each finger flick. Pt. counting added. 2) Side to side multidirectional- Pt. required fewer cues for increased hip, and knee extension. No tactile cues needed for hip, and knee flexion. Finger flicks  were performed for each rep.  3) Step and reach forward step- Cues were  required for upright midline posture, and to increase arm span. One episode of LOB when stepping forward with the left LE. Pt. Was able to self-correct. 4) Step and reach sideways step- Pt. required  fewer verbal cues to increase right knee flexion when stepping to the left. This exercise was performed in the standard version without the chair 5) Step and reach backwards step- Improved with a larger steps back posteriorly.  Pt. Was able to complete the movements more fluidly. With no cuing required for Iifting of the toes. 6) Rock and reach forward/backward- Pt. was able to complete the movement in the standard form without the use of a chair. Pt. required cues for upright posture. Pt. required cues to coordinate the bilateral UE movements. 7) Rock and reach sideways- Pt. continues to present with  limited rotation through the trunk when turning to look over his shoulders. Pt. required cues for UE extension, and to spread digits. Pt. Performed the task in the standard version without the chair.    Functional Component Task:   1.Sit to stand BIG functional component task for 5 reps with visual visual demonstration, visual, and tactile cues. Pt. was able to perform the functional component task more fluidly from start to finish. Verbal cues, and visual cues were required for posture, and hand position. 2.Stepping over objects:  3.Increasing Arm swing: Pt. worked on reps on increasing the amplitude of posterior arm swing using the wall for feedback. 4:Turning/Elongating the trunk: Pt. worked on controlled turning, and trunk rotation transferring balls from one basket on the left. Pt. Worked on controled turning to each side. 5: Formulating large letters for his signature: Pt. worked on large/BIG strokes writing lists of months, and flower names in cursive form with wide lines for visual cuing for the top, and base of the letters to consistently increase the amplitude of the letters.   Pt. worked on bilateral pectoral stretches in standing at the wall followed by sitting. Pt. required cues for visual demonstration, and education about the importance of pectoral stretches in promoting upright midline posture with Parkinson's Disease.   BIG ambulation:     Pt. performed BIG Ambulation with emphasis placed on increasing amplitude of movements for arm swing, and large, long steps. Pt. was able to complete  a total of 600 ft. Pt. worked on [employer] the whole distance with emphasis placed on armswing, and large steps, and marching with high knees.     Pt. continues to present with no reports of pain during the session, however Pt. has pre-existing bilateral knee pain as per above. Pt. was able to perform all of the MDE's in the standard form. Pt. Presented with one LOB during the forward step, and  reach, however was able to self-correct. Pt. continues to require the backwards step and reach in the standard version.  Pt. required increased cues to coordinate bilateral UE movements during the MDE's. Pt. continues to require consistent verbal cues, tactile cues, visual demonstration, and modeling of each exercise. Pt. required supervision during the repetitive MDE's. Pt. Continues to require cues for upright posture when standing and reaching. Pt. was able to tolerate the addition of  bilateral hand flicks to all seated MDE's. Pt. Continues to require modeling of each exercise for proper form, technique,a nd hand position. Pt. was able recall each step in the sequence of MDE exercises. Pt. required verbal cues, visual cues, and modeling of the correct hand position during the exercises.  Pt. required tactile cues for LE  position, UE alignment, upright posture, and hand position. Pt. continues to benefit from OT services for the LSVT BIG program to specifically focus on increasing the amplitude of movements for improved UE functioning, balance, and safety during ADLs, and IADL tasks while reducing overall fall risk.                                                                                                                             PATIENT EDUCATION: Education details: LSVT MDE's, Goals, POC Person educated: Patient Education method: Explanation, Demonstration, Tactile cues, and Verbal cues Education comprehension: verbalized understanding  HOME EXERCISE PROGRAM:  Maximal daily exercises to be performed 2 times a day.    GOALS: Goals reviewed with patient? Yes  SHORT TERM GOALS: Target date: 08/04/2022    Pt. Will be independent with HEPs for Maximal Daily Exercises, including modifications. Baseline: 08/01/2022: Pt. Was provided with the adapted version of MDE initially, and upgraded to the Standard version. Pt. Continues to require cues, and visual demonstration/modeling of the exercises.  Eval: No current HEP. Training to begin for the MDE on the next visit. Goal status: INITIAL    LONG TERM GOALS: Target date: 08/25/2022    Pt. Will be independent with the Established LSVT  Functional component, and Hierarchy tasks.  Baseline: 08/01/2022: pt. Requires continued work on the optimizing, and increasing the amplitude for the Functional component, and Hierarchy tasks. Eval: Functional component tasks, and hierarchy tasks to be established Goal status: Ongoing  2.  Pt. Will improve the BERG Balance score by 5 points to reduce fall risk during daily tasks. Baseline: 08/01/2022: BERG: 46/56 Eval: BERG Balance score: 43/56 Goal status: Ongoing    3.  Pt. Will improve the Freezing of Gait score by 3 points.  Baseline: 08/01/2022: FOG score: 7 Eval: FOG score: 9 Goal status: Ongoing  4.  Pt. Increase the FOTO score by 3 points for Pt. perceived improvement with assessment specific ADLs, and IADL tasks.  Baseline: 08/01/2022: FOTO: 50 Eval: FOTO score: 48 Goal status:Ongoing  5.  Pt. Will write his name with 75% legibility in preparation for signing documents.  Baseline: 08/01/2022: 50% legibility for signing his name. Eval: Pt. signed his name with 50% legibility. Goal status: Ongoing    ASSESSMENT:  CLINICAL IMPRESSION:  Pt. continues to present with no reports of pain during the session, however Pt. has pre-existing bilateral knee pain as per above. Pt. was able to perform all of the MDE's in the standard form. Pt. Presented with one LOB during the forward step, and reach, however was able to self-correct. Pt. continues to require the backwards step and reach in the standard version.  Pt. required increased cues to coordinate bilateral UE movements during the MDE's. Pt. continues to require consistent verbal cues, tactile cues, visual demonstration, and modeling of each exercise. Pt. required supervision during the repetitive MDE's. Pt. Continues to require cues for upright  posture when standing and reaching. Pt. was  able to tolerate the addition of  bilateral hand flicks to all seated MDE's. Pt. Continues to require modeling of each exercise for proper form, technique,a nd hand position. Pt. was able recall each step in the sequence of MDE exercises. Pt. required verbal cues, visual cues, and modeling of the correct hand position during the exercises.  Pt. required tactile cues for LE position, UE alignment, upright posture, and hand position. Pt. continues to benefit from OT services for the LSVT BIG program to specifically focus on increasing the amplitude of movements for improved UE functioning, balance, and safety during ADLs, and IADL tasks while reducing overall fall risk.                                      PERFORMANCE DEFICITS: in functional skills including ADLs, IADLs, coordination, ROM, strength, Fine motor control, and Gross motor control, cognitive skills including memory and problem solving, and psychosocial skills including coping strategies, environmental adaptation, habits, interpersonal interactions, and routines and behaviors.   IMPAIRMENTS: are limiting patient from ADLs, IADLs, and leisure.   CO-MORBIDITIES: may have co-morbidities  that affects occupational performance. Patient will benefit from skilled OT to address above impairments and improve overall function.  MODIFICATION OR ASSISTANCE TO COMPLETE EVALUATION: Min-Moderate modification of tasks or assist with assess necessary to complete an evaluation.  OT OCCUPATIONAL PROFILE AND HISTORY: Detailed assessment: Review of records and additional review of physical, cognitive, psychosocial history related to current functional performance.  CLINICAL DECISION MAKING: Moderate - several treatment options, min-mod task modification necessary  REHAB POTENTIAL: Good  EVALUATION COMPLEXITY: Moderate    PLAN:  OT FREQUENCY: 4x/week  OT DURATION: 6 weeks  PLANNED INTERVENTIONS: self care/ADL  training, therapeutic exercise, therapeutic activity, neuromuscular re-education, manual therapy, passive range of motion, functional mobility training, moist heat, patient/family education, cognitive remediation/compensation, and DME and/or AE instructions  RECOMMENDED OTHER SERVICES: SLP  CONSULTED AND AGREED WITH PLAN OF CARE: Patient and family member/caregiver  PLAN FOR NEXT SESSION:  continue to treatment, and refining MDE's  Harrel Carina, MS, OTR/L   Harrel Carina, OT 08/08/2022, 12:14 PM

## 2022-08-08 NOTE — Therapy (Signed)
OUTPATIENT SPEECH LANGUAGE PATHOLOGY TREATMENT NOTE   Patient Name: Neil Bailey MRN: [id number] DOB:1950/11/19, 72 y.o., male Today's Date: 08/08/2022   PCP: Emily Filbert, MD REFERRING PROVIDER: Jennings Books, MD  END OF SESSION:   End of Session - 08/08/22 0910     Visit Number 7    Number of Visits 17    Date for SLP Re-Evaluation 09/07/22    Authorization Type [employer] Medicare HMO/PPO    Progress Note Due on Visit 10    SLP Start Time 0800    SLP Stop Time  0900    SLP Time Calculation (min) 60 min    Activity Tolerance Patient tolerated treatment well               Past Medical History:  Diagnosis Date   Aortic atherosclerosis ([place]) 03/09/2020   Noted on abdominal CT   Atrial fibrillation ([place])    COVID-19 06/2019   DVT (deep venous thrombosis) (HCC)    Dyspnea    since covid dec 2020   Dysrhythmia    paroxysmal atrial fibrillation   HTN (hypertension)    Lab test positive for detection of COVID-19 virus 06/30/2019   Morbid obesity ([place])    Pedal edema    Renal cyst, acquired, right    Thrombocytopenia ([place])    Past Surgical History:  Procedure Laterality Date   COLONOSCOPY  7-8 years ago   [facility]   COLONOSCOPY WITH PROPOFOL N/A 08/26/2020   Procedure: COLONOSCOPY WITH PROPOFOL;  Surgeon: Toledo, Benay Pike, MD;  Location: [facility];  Service: Gastroenterology;  Laterality: N/A;   CYSTOSCOPY Left 04/03/2020   Procedure: CYSTOSCOPY Birdena Crandall;  Surgeon: [name], MD;  Location: [facility];  Service: Urology;  Laterality: Left;   EYE SURGERY  05/2014, 06/2014   cataracts   HERNIA REPAIR     HYDROCELE EXCISION Right 08/07/2020   Procedure: HYDROCELECTOMY ADULT;  Surgeon: [name], MD;  Location: [facility];  Service: Urology;  Laterality: Right;   INGUINAL HERNIA REPAIR Right 01/19/2015   Procedure: RIGHT INCARCERATED INGUINAL HERNIA REPAIR;  Surgeon: Christene Lye, MD;  Location: [facility];  Service: General;  Laterality: Right;    TONSILLECTOMY     XI ROBOTIC ASSISTED INGUINAL HERNIA REPAIR WITH MESH Left 04/03/2020   Procedure: XI ROBOTIC ASSISTED INGUINAL HERNIA REPAIR WITH MESH, possible bilateral;  Surgeon: Jules Husbands, MD;  Location: [facility];  Service: General;  Laterality: Left;   Patient Active Problem List   Diagnosis Date Noted   SOB (shortness of breath) on exertion 06/23/2020   Pedal edema 06/08/2020   Renal cyst, acquired, right 06/08/2020   Thrombocytopenia ([place]) 02/11/2020   Acute deep vein thrombosis (DVT) of popliteal vein of right lower extremity ([place]) 07/18/2019   History of 2019 novel coronavirus disease (COVID-19) 07/18/2019   Acute respiratory failure with hypoxia ([place]) 07/04/2019   Atrial fibrillation, chronic ([place]) 07/04/2019   Pneumonia due to COVID-19 virus 07/01/2019   CAP (community acquired pneumonia) 06/30/2019   Medicare annual wellness visit, initial 03/20/2017   Morbid obesity with BMI of 40.0-44.9, adult ([place]) 12/29/2016   History of DVT (deep vein thrombosis) 10/17/2013    ONSET DATE: 05/05/2022  REFERRING DIAG: G20.A1 (ICD-10-CM) - Parkinson's disease    THERAPY DIAG:  Cognitive communication deficit   Dysarthria  THERAPY DIAG:  Cognitive communication deficit  Rationale for Evaluation and Treatment Rehabilitation  SUBJECTIVE: Pt appeared in a good mood and motivated to work, stated he was tired and ready  to be done with speech soon.   Pt accompanied by: self  PAIN:  Are you having pain? No  PATIENT GOALS: improve his memory  OBJECTIVE:   TODAY'S TREATMENT:  Skilled treatment session focused on pt's cognitive communication goals. SLP facilitated the session by providing the following interventions:  To target pt's deficits in memory and awareness, the Constant Therapy Clinician (CTC) app was utilized.   Understand a Voicemail-Level 1 with distractions: without notes and no replays, client achieved 80% accuracy. With notes client achieved 85% accuracy.  Without notes, but with an additional replay client achieved 100% accuracy.    SLP facilitated the use of a deck of cards to work on new working memory, divided attention, and awareness. Pt was instructed to set up the game as he recalled from the previous session. The pt set up the game board with 100% accuracy. Throughout game play, the client required minimal cueing in making correct moves across the board and even cued the SLP in paying better attention to the moves they were making.   Pt stated that he has difficulties recalling names/places and deals with frustrations in communication due to this difficulty. Pt was noted comparing himself to his severely aphasic friend, confusing his memory loss with loss of language. Education was further provided on memory deficits and recommendation to use compensatory memory strategies to aid in memory. Although pt expressed understanding, further education is likely to be required.   PATIENT EDUCATION: Education details: see above Person educated: Patient Education method: Explanation, Demonstration, and Verbal cues Education comprehension: verbalized understanding  HOME EXERCISE PROGRAM:  Attempt to describe people/places that he can remember  GOALS: Goals reviewed with patient? Yes  SHORT TERM GOALS: Target date: 10 sessions   The patient will maximize voice quality and loudness using breath support for sustained vowel production, pitch glides, and hierarchal speech drill. Baseline: Goal status: INITIAL   2.   The patient will demonstrate abdominal breathing patterns and steady release of breath on exhalation to optimize efficiency of voicing and decrease laryngeal hyperfunction Baseline:  Goal status: INITIAL   3.  Patient will report improved communication effectiveness as measured by Communicative Effectiveness Survey.  Baseline:  Goal status: INITIAL   4.  Pt will use external aid for functional recall of completed/upcoming  activities 75% accuracy with Min A.  Baseline:  Goal status: INITIAL   5.  Pt will use strategies to improve memory for important information with 75% acc. with minimal assistance (ie., white board, daily planner/calendar, Apps on phone).  Baseline:  Goal status: INITIAL   6.  Pt will complete mod complex money/financial problems 100% accuracy in a reasonable amount of time with double checking, use of strategies and rare min A. Baseline:  Goal status: INITIAL   LONG TERM GOALS: Target date: 09/07/2022 Pt will complete mod complex money/financial problems 100% accuracy in a reasonable amount of time with double checking, use of strategies Baseline:  Goal status: INITIAL   2.  Pt will use strategies to improve memory for important information with 75% acc. (ie., white board, daily planner/calendar, Apps on phone).  Baseline:  Goal status: INITIAL   3.  Pt will use external aid for functional recall of completed/upcoming activities > 90% accuracy Baseline:  Goal status: INITIAL   4.  Pt will read paragraph length material with rare Min A cues for use of loud, good quality voice.  Baseline:  Goal status: INITIAL    ASSESSMENT:  CLINICAL IMPRESSION:  Pt continues  to participate in ST sessions but uncertain if pt will be motivated to use strategies during functional daily activities.   OBJECTIVE IMPAIRMENTS include memory and dysarthria. These impairments are limiting patient from managing medications, managing appointments, managing finances, household responsibilities, ADLs/IADLs, and effectively communicating at home and in community. Factors affecting potential to achieve goals and functional outcome are ability to learn/carryover information, co-morbidities, and previous level of function. Patient will benefit from skilled SLP services to address above impairments and improve overall function.  REHAB POTENTIAL:   PLAN: SLP FREQUENCY: 2x/week  SLP DURATION: 8  weeks  PLANNED INTERVENTIONS: Environmental controls, Cognitive reorganization, Internal/external aids, Functional tasks, SLP instruction and feedback, Compensatory strategies, and Patient/family education  Alphonzo Grieve, SLP Graduate Clinician    Happi B. Rutherford Nail, M.S., CCC-SLP, Mining engineer Certified Brain Injury [place]  [place] Office [phone] Ascom [phone] Fax [phone]

## 2022-08-09 ENCOUNTER — Encounter: Payer: Medicare HMO | Admitting: Speech Pathology

## 2022-08-09 ENCOUNTER — Ambulatory Visit: Payer: Medicare HMO | Admitting: Occupational Therapy

## 2022-08-09 DIAGNOSIS — R41841 Cognitive communication deficit: Secondary | ICD-10-CM | POA: Diagnosis not present

## 2022-08-09 DIAGNOSIS — M6281 Muscle weakness (generalized): Secondary | ICD-10-CM

## 2022-08-09 DIAGNOSIS — R278 Other lack of coordination: Secondary | ICD-10-CM

## 2022-08-10 ENCOUNTER — Encounter: Payer: Self-pay | Admitting: Occupational Therapy

## 2022-08-10 ENCOUNTER — Ambulatory Visit: Payer: Medicare HMO | Admitting: Speech Pathology

## 2022-08-10 ENCOUNTER — Ambulatory Visit: Payer: Medicare HMO | Admitting: Occupational Therapy

## 2022-08-10 DIAGNOSIS — R41841 Cognitive communication deficit: Secondary | ICD-10-CM

## 2022-08-10 DIAGNOSIS — M6281 Muscle weakness (generalized): Secondary | ICD-10-CM

## 2022-08-10 DIAGNOSIS — R278 Other lack of coordination: Secondary | ICD-10-CM

## 2022-08-10 NOTE — Therapy (Signed)
OUTPATIENT SPEECH LANGUAGE PATHOLOGY TREATMENT NOTE Discharge Note   Patient Name: Neil Bailey MRN: [id number] DOB:1951/05/31, 72 y.o., male Today's Date: 08/10/2022   PCP: Emily Filbert, MD REFERRING PROVIDER: Jennings Books, MD  END OF SESSION:   End of Session - 08/10/22 0906     Visit Number 8    Number of Visits 17    Date for SLP Re-Evaluation 09/07/22    Authorization Type [employer] Medicare HMO/PPO    Progress Note Due on Visit 10    SLP Start Time 0800    SLP Stop Time  0900    SLP Time Calculation (min) 60 min    Activity Tolerance Patient tolerated treatment well               Past Medical History:  Diagnosis Date   Aortic atherosclerosis ([place]) 03/09/2020   Noted on abdominal CT   Atrial fibrillation ([place])    COVID-19 06/2019   DVT (deep venous thrombosis) (HCC)    Dyspnea    since covid dec 2020   Dysrhythmia    paroxysmal atrial fibrillation   HTN (hypertension)    Lab test positive for detection of COVID-19 virus 06/30/2019   Morbid obesity ([place])    Pedal edema    Renal cyst, acquired, right    Thrombocytopenia ([place])    Past Surgical History:  Procedure Laterality Date   COLONOSCOPY  7-8 years ago   [facility]   COLONOSCOPY WITH PROPOFOL N/A 08/26/2020   Procedure: COLONOSCOPY WITH PROPOFOL;  Surgeon: Toledo, Benay Pike, MD;  Location: [facility];  Service: Gastroenterology;  Laterality: N/A;   CYSTOSCOPY Left 04/03/2020   Procedure: CYSTOSCOPY Birdena Crandall;  Surgeon: [name], MD;  Location: [facility];  Service: Urology;  Laterality: Left;   EYE SURGERY  05/2014, 06/2014   cataracts   HERNIA REPAIR     HYDROCELE EXCISION Right 08/07/2020   Procedure: HYDROCELECTOMY ADULT;  Surgeon: [name], MD;  Location: [facility];  Service: Urology;  Laterality: Right;   INGUINAL HERNIA REPAIR Right 01/19/2015   Procedure: RIGHT INCARCERATED INGUINAL HERNIA REPAIR;  Surgeon: Christene Lye, MD;  Location: [facility];  Service: General;   Laterality: Right;   TONSILLECTOMY     XI ROBOTIC ASSISTED INGUINAL HERNIA REPAIR WITH MESH Left 04/03/2020   Procedure: XI ROBOTIC ASSISTED INGUINAL HERNIA REPAIR WITH MESH, possible bilateral;  Surgeon: Jules Husbands, MD;  Location: [facility];  Service: General;  Laterality: Left;   Patient Active Problem List   Diagnosis Date Noted   SOB (shortness of breath) on exertion 06/23/2020   Pedal edema 06/08/2020   Renal cyst, acquired, right 06/08/2020   Thrombocytopenia ([place]) 02/11/2020   Acute deep vein thrombosis (DVT) of popliteal vein of right lower extremity ([place]) 07/18/2019   History of 2019 novel coronavirus disease (COVID-19) 07/18/2019   Acute respiratory failure with hypoxia ([place]) 07/04/2019   Atrial fibrillation, chronic ([place]) 07/04/2019   Pneumonia due to COVID-19 virus 07/01/2019   CAP (community acquired pneumonia) 06/30/2019   Medicare annual wellness visit, initial 03/20/2017   Morbid obesity with BMI of 40.0-44.9, adult ([place]) 12/29/2016   History of DVT (deep vein thrombosis) 10/17/2013    ONSET DATE: 05/05/2022  REFERRING DIAG: G20.A1 (ICD-10-CM) - Parkinson's disease    THERAPY DIAG:  Cognitive communication deficit   Dysarthria  THERAPY DIAG:  Cognitive communication deficit  Rationale for Evaluation and Treatment Rehabilitation  SUBJECTIVE: Pt appeared in a good mood and motivated to work, stated he was glad  this was his final week.   Pt accompanied by: self  PAIN:  Are you having pain? No  PATIENT GOALS: improve his memory  OBJECTIVE:   TODAY'S TREATMENT:  Skilled treatment session focused on pt's cognitive communication goals. SLP facilitated the session by providing the following interventions:  To target pt's deficits in word finding, SLP utilized SFA (semantic feature analysis) which focuses on meaning-based properties for nouns. The pt previously stated that he especially struggles with recalling the names of restaurants when in conversation.  SLP had the patient list most frequented restaurants by the pt, using the SFA chart to define meaning-based properties of each restaurant (group, use, action, describe, location, association). During the activity, the pt required minimal cueing in recalling details of each restaurant and responded well to fill in the blank cues. The pt also stated he struggled with recalling names. SLP instructed the pt to list out attributes of mutually known people (a friend, OT) to better assist describing people when name finding fails. During this activity, pt required moderate cueing, struggling with describing physical traits.   PATIENT EDUCATION: Education details: see above Person educated: Patient Education method: Explanation, Demonstration, and Verbal cues Education comprehension: verbalized understanding  HOME EXERCISE PROGRAM:  Attempt to describe people/places that he can remember  GOALS: Goals reviewed with patient? Yes  SHORT TERM GOALS: Target date: 10 sessions   The patient will maximize voice quality and loudness using breath support for sustained vowel production, pitch glides, and hierarchal speech drill. Baseline: Goal status: INITIAL   Goal Status: MET 2.   The patient will demonstrate abdominal breathing patterns and steady release of breath on exhalation to optimize efficiency of voicing and decrease laryngeal hyperfunction Baseline:  Goal status: INITIAL  Goal Status: MET 3.  Patient will report improved communication effectiveness as measured by Communicative Effectiveness Survey.  Baseline:  Goal status: INITIAL  Goal Status: MET 4.  Pt will use external aid for functional recall of completed/upcoming activities 75% accuracy with Min A.  Baseline:  Goal status: INITIAL  Goal Status: MET 5.  Pt will use strategies to improve memory for important information with 75% acc. with minimal assistance (ie., white board, daily planner/calendar, Apps on phone).  Baseline:  Goal  status: INITIAL  Goal Status: MET 6.  Pt will complete mod complex money/financial problems 100% accuracy in a reasonable amount of time with double checking, use of strategies and rare min A. Baseline:  Goal status: INITIAL  Goal Status: MET LONG TERM GOALS: Target date: 09/07/2022 Pt will complete mod complex money/financial problems 100% accuracy in a reasonable amount of time with double checking, use of strategies Baseline:  Goal status: INITIAL  Goal Status: MET 2.  Pt will use strategies to improve memory for important information with 75% acc. (ie., white board, daily planner/calendar, Apps on phone).  Baseline:  Goal status: INITIAL  Goal Status: MET 3.  Pt will use external aid for functional recall of completed/upcoming activities > 90% accuracy Baseline:  Goal status: INITIAL  Goal Status: MET 4.  Pt will read paragraph length material with rare Min A cues for use of loud, good quality voice.  Baseline:  Goal status: INITIAL  Goal Status: MET  ASSESSMENT:  CLINICAL IMPRESSION:  Pt made good progress over the course of ST sessions. As a result, he has met his short term and long term goals and is appropriate for discharge.    Alphonzo Grieve, SLP Graduate Clinician    Happi B. Rutherford Nail, M.S.,  CCC-SLP, Financial trader Injury [place]  [place] Office [phone] Ascom [phone] Fax [phone]

## 2022-08-10 NOTE — Therapy (Signed)
OUTPATIENT OCCUPATIONAL THERAPY NEURO/LSVT TREATMENT   Patient Name: Neil Bailey MRN: [id number] DOB:[birth year], 72 y.o., male   PCP: Emily Filbert, MD REFERRING PROVIDER: Jennings Books, MD  END OF SESSION:  OT End of Session - 08/10/22 1840     Visit Number 16    Number of Visits 17    Date for OT Re-Evaluation 08/25/22    Authorization Time Period Progress report period starting 07/14/2022    OT Start Time 0902    OT Stop Time 1000    OT Time Calculation (min) 58 min    Activity Tolerance Patient tolerated treatment well    Behavior During Therapy [facility] for tasks assessed/performed              Past Medical History:  Diagnosis Date   Aortic atherosclerosis ([place]) 03/09/2020   Noted on abdominal CT   Atrial fibrillation ([place])    COVID-19 06/2019   DVT (deep venous thrombosis) ([facility])    Dyspnea    since covid dec 2020   Dysrhythmia    paroxysmal atrial fibrillation   HTN (hypertension)    Lab test positive for detection of COVID-19 virus 06/30/2019   Morbid obesity ([place])    Pedal edema    Renal cyst, acquired, right    Thrombocytopenia ([place])    Past Surgical History:  Procedure Laterality Date   COLONOSCOPY  7-8 years ago   [facility]   COLONOSCOPY WITH PROPOFOL N/A 08/26/2020   Procedure: COLONOSCOPY WITH PROPOFOL;  Surgeon: Toledo, Benay Pike, MD;  Location: [facility] ENDOSCOPY;  Service: Gastroenterology;  Laterality: N/A;   CYSTOSCOPY Left 04/03/2020   Procedure: CYSTOSCOPY Birdena Crandall;  Surgeon: [name], MD;  Location: [facility] ORS;  Service: Urology;  Laterality: Left;   EYE SURGERY  05/2014, 06/2014   cataracts   HERNIA REPAIR     HYDROCELE EXCISION Right 08/07/2020   Procedure: HYDROCELECTOMY ADULT;  Surgeon: [name], MD;  Location: [facility] ORS;  Service: Urology;  Laterality: Right;   INGUINAL HERNIA REPAIR Right 01/19/2015   Procedure: RIGHT INCARCERATED INGUINAL HERNIA REPAIR;  Surgeon: Christene Lye, MD;  Location: [facility] ORS;  Service:  General;  Laterality: Right;   TONSILLECTOMY     XI ROBOTIC ASSISTED INGUINAL HERNIA REPAIR WITH MESH Left 04/03/2020   Procedure: XI ROBOTIC ASSISTED INGUINAL HERNIA REPAIR WITH MESH, possible bilateral;  Surgeon: Jules Husbands, MD;  Location: [facility] ORS;  Service: General;  Laterality: Left;   Patient Active Problem List   Diagnosis Date Noted   SOB (shortness of breath) on exertion 06/23/2020   Pedal edema 06/08/2020   Renal cyst, acquired, right 06/08/2020   Thrombocytopenia ([place]) 02/11/2020   Acute deep vein thrombosis (DVT) of popliteal vein of right lower extremity ([place]) 07/18/2019   History of 2019 novel coronavirus disease (COVID-19) 07/18/2019   Acute respiratory failure with hypoxia ([place]) 07/04/2019   Atrial fibrillation, chronic ([place]) 07/04/2019   Pneumonia due to COVID-19 virus 07/01/2019   CAP (community acquired pneumonia) 06/30/2019   Medicare annual wellness visit, initial 03/20/2017   Morbid obesity with BMI of 40.0-44.9, adult ([place]) 12/29/2016   History of DVT (deep vein thrombosis) 10/17/2013    ONSET DATE: 12/09/2021  REFERRING DIAG: Parkinson's Disease  THERAPY DIAG:  Muscle weakness (generalized)  Other lack of coordination  Rationale for Evaluation and Treatment: Rehabilitation  SUBJECTIVE:   SUBJECTIVE STATEMENT: Patient reports he finished up with speech therapy today and has his last appointment for LVT big tomorrow. Patient reports doing his maximal daily  exercises one time yesterday.   Pt accompanied by: self  PERTINENT HISTORY:  Pt. is a 72 y.o. male who was diagnosed with Parkinson's Disease in June of 2023.   PRECAUTIONS: None  WEIGHT BEARING RESTRICTIONS: No  PAIN:  Are you having pain? Knee pain 5-6/10 right knee pain  FALLS: Has patient fallen in last 6 months? No  LIVING ENVIRONMENT: Lives with: lives with their spouse Lives in: House/apartment Stairs: 3- 4 steps to enter. 2 story home Has following equipment at home:  None  PLOF: Independent  PATIENT GOALS: Regain stability  OBJECTIVE:   HAND DOMINANCE: Ambidextrous  ADLs: Overall ADLs:   Transfers/ambulation related to ADLs: Eating:  Tremors, uses a spoon instead of a fork Grooming: Difficulty aligning  toothpaste on the tooth brush, difficulty cutting toenails-Consistently goes to a nail salon to have them done.  UB Dressing:  Independent LB Dressing: Independent, increased time to complete Toileting: Independent Bathing: Independent Tub Shower transfers: Independent    IADLs: Shopping: Independent Light housekeeping: No changes noted  Meal Prep: Does not cook, grills Community mobility:  Driving Independently Medication management: Independent with weekly pillbox Financial management: No changes noted Handwriting: 50% legible Name in cursive writing  MOBILITY STATUS: Independent  POSTURE COMMENTS:   Sitting balance:  Good unsupported sitting balance  ACTIVITY TOLERANCE: Activity tolerance: Pt. requires rest breaks during standing assessments  FUNCTIONAL OUTCOME MEASURES:  FOTO: 48 5x's STS: 18 sec. TUG: 11 sec. Freezing of Gait: 9 BERG Balance Scale: 43/56  08/01/2022  FOTO: 50 5x's STS: 14 sec. TUG: 9 sec. Freezing of Gait: 7 BERG Balance Scale: 46/56; Standing one foot in front of the other: This test was scored with the left foot in front of the right: 0, (Pt. Then attempted with the right foot in front of the left with a score of 4)   07/18/2022:   6 minute walk test: 890 ft. with a seated rest break required at the end.  6 minute walk test: 960 ft. with a seated rest break required at the end.   HAND FUNCTION: Grip strength: Right: 68 lbs; Left: 64 lbs and Lateral pinch: Right: 18 lbs, Left: 18 lbs 3pt. Pinch Right: 18 lbs, Left: 17 lbs  Grip strength: Right: 68 lbs; Left: 58 lbs and Lateral pinch: Right: 18 lbs, Left: 18 lbs 3pt. Pinch Right: 16 lbs, Left: 18 lbs  COORDINATION: 9 Hole Peg test: Right: 33  sec; Left: 27 sec  9 Hole Peg test: Right: 34 sec; Left: 28 sec   SENSATION: WFL  EDEMA: N/A  MUSCLE TONE:  WFL  COGNITION: Overall cognitive status: Pt. reports cognitive/memory changes. Pt. being seen by SLP  VISION: Subjective report:  Pt. reports no changes with vision.  VISION ASSESSMENT:  WFL for tasks assessed  PERCEPTION:  TBA  PRAXIS: Impaired: Motor planning  TODAY'S TREATMENT:    DATE: 08/10/2022    LSVT: Patient seen for LSVT Daily Session Maximal Daily Exercises for facilitation/coordination of movement Maximum Sustained Movements are designed to rescale the amplitude of movement output for generalization to daily functional activities. Performed as follows for 1 set of 10 repetitions each multi-directional sustained movements: 1) Floor to ceiling- Pt counting aloud for repetitions, able to perform without cues. 2) Side to side multidirectional- Finger flicks were performed for each rep. Cues to turn more to the side to allow greater hip extension.   3) Step and reach forward step- Cues were required for upright midline posture, and to increase arm span.  4) Step and reach sideways step-  Cues for head turning to the direction patient was stepping. 5) Step and reach backwards step- Improved with a larger steps back posteriorly.  Pt. Was able to complete the movements more fluidly. With no cuing required for Iifting of the toes. 6) Rock and reach forward/backward-Pt. required cues for upright posture. Pt. required cues to coordinate the bilateral UE movements. 7) Rock and reach sideways- Pt. continues to present with limited rotation through the trunk when turning to look over his shoulders. Pt. required cues for UE extension, and to spread digits.   All the above exercises were performed in the STANDARD version without the use of a chair, therapist provided min guard to supervision with all exercises for safety with balance.    Functional Component Task:    1.Sit to stand BIG functional component task for 5 reps with visual visual demonstration, visual, and tactile cues. Pt. was able to perform the functional component task more fluidly from start to finish. Verbal cues, and visual cues were required for posture, and hand position. 2.Stepping over objects: Continued cues for amplitude of stepping to clear objects 3.Increasing Arm swing: Pt. worked on reps on increasing the amplitude of posterior arm swing during functional mobility 4:Turning/Elongating the trunk:  5: Formulating large letters for his signature: Pt. worked on large/BIG strokes writing lists of 10 types of birds and 10 meats in cursive form with wide lines for visual cuing for the top, and base of the letters to consistently increase the amplitude of the letters, then completed each one in print version.   Pt. worked on bilateral pectoral stretches in standing at the wall followed by sitting. Pt. required cues for visual demonstration, and education about the importance of pectoral stretches in promoting upright midline posture with Parkinson's Disease.   BIG ambulation:     Pt. performed BIG Ambulation with emphasis placed on increasing amplitude of movements for arm swing, and large, long steps. Pt. was able to complete  a total of 550 ft. Pt. worked on [employer] the whole distance with emphasis placed on armswing, and large steps, and marching with high knees.                                                                                                                                  PATIENT EDUCATION: Education details: LSVT MDE's, Goals, POC Person educated: Patient Education method: Explanation, Demonstration, Tactile cues, and Verbal cues Education comprehension: verbalized understanding  HOME EXERCISE PROGRAM:  Maximal daily exercises to be performed 2 times a day.    GOALS: Goals reviewed with patient? Yes  SHORT TERM GOALS: Target date: 08/04/2022    Pt.  Will be independent with HEPs for Maximal Daily Exercises, including modifications. Baseline: 08/01/2022: Pt. Was provided with the adapted version of MDE initially, and upgraded to the Standard version. Pt. Continues to require cues, and visual demonstration/modeling of the exercises. Eval:  No current HEP. Training to begin for the MDE on the next visit. Goal status: INITIAL    LONG TERM GOALS: Target date: 08/25/2022    Pt. Will be independent with the Established LSVT  Functional component, and Hierarchy tasks.  Baseline: 08/01/2022: pt. Requires continued work on the optimizing, and increasing the amplitude for the Functional component, and Hierarchy tasks. Eval: Functional component tasks, and hierarchy tasks to be established Goal status: Ongoing  2.  Pt. Will improve the BERG Balance score by 5 points to reduce fall risk during daily tasks. Baseline: 08/01/2022: BERG: 46/56 Eval: BERG Balance score: 43/56 Goal status: Ongoing    3.  Pt. Will improve the Freezing of Gait score by 3 points.  Baseline: 08/01/2022: FOG score: 7 Eval: FOG score: 9 Goal status: Ongoing  4.  Pt. Increase the FOTO score by 3 points for Pt. perceived improvement with assessment specific ADLs, and IADL tasks.  Baseline: 08/01/2022: FOTO: 50 Eval: FOTO score: 48 Goal status:Ongoing  5.  Pt. Will write his name with 75% legibility in preparation for signing documents.  Baseline: 08/01/2022: 50% legibility for signing his name. Eval: Pt. signed his name with 50% legibility. Goal status: Ongoing    ASSESSMENT:  CLINICAL IMPRESSION:  Patient has continued to make good progress in all areas. He is aware he would benefit from performing exercises two times a day especially during this intensive portion of the program. Therapist recommends patient continue with twice a day even at discharge since he is still making gains and then progress to one time a day for maintenance.  Will plan to finalize his home program  next session and prepare for discharge with outcome measures and goals updated.                                 PERFORMANCE DEFICITS: in functional skills including ADLs, IADLs, coordination, ROM, strength, Fine motor control, and Gross motor control, cognitive skills including memory and problem solving, and psychosocial skills including coping strategies, environmental adaptation, habits, interpersonal interactions, and routines and behaviors.   IMPAIRMENTS: are limiting patient from ADLs, IADLs, and leisure.   CO-MORBIDITIES: may have co-morbidities  that affects occupational performance. Patient will benefit from skilled OT to address above impairments and improve overall function.  MODIFICATION OR ASSISTANCE TO COMPLETE EVALUATION: Min-Moderate modification of tasks or assist with assess necessary to complete an evaluation.  OT OCCUPATIONAL PROFILE AND HISTORY: Detailed assessment: Review of records and additional review of physical, cognitive, psychosocial history related to current functional performance.  CLINICAL DECISION MAKING: Moderate - several treatment options, min-mod task modification necessary  REHAB POTENTIAL: Good  EVALUATION COMPLEXITY: Moderate   PLAN:  OT FREQUENCY: 4x/week  OT DURATION: 6 weeks  PLANNED INTERVENTIONS: self care/ADL training, therapeutic exercise, therapeutic activity, neuromuscular re-education, manual therapy, passive range of motion, functional mobility training, moist heat, patient/family education, cognitive remediation/compensation, and DME and/or AE instructions  RECOMMENDED OTHER SERVICES: SLP  CONSULTED AND AGREED WITH PLAN OF CARE: Patient and family member/caregiver  PLAN FOR NEXT SESSION:  continue to treatment, and refining MDE's  Achilles Dunk, OTR/L, CLT  Laurisa Sahakian, OT 08/10/2022, 6:41 PM

## 2022-08-10 NOTE — Therapy (Signed)
OUTPATIENT OCCUPATIONAL THERAPY NEURO/LSVT TREATMENT   Patient Name: Neil Bailey MRN: [id number] DOB:26-Sep-1950, 72 y.o., male   PCP: Emily Filbert, MD REFERRING PROVIDER: Jennings Books, MD  END OF SESSION:  OT End of Session - 08/10/22 1631     Visit Number 15    Number of Visits 17    Date for OT Re-Evaluation 08/25/22    Authorization Time Period Progress report period starting 07/14/2022    OT Start Time 0858    OT Stop Time 1000    OT Time Calculation (min) 62 min    Activity Tolerance Patient tolerated treatment well    Behavior During Therapy Neil Bailey for tasks assessed/performed              Past Medical History:  Diagnosis Date   Aortic atherosclerosis ([place]) 03/09/2020   Noted on abdominal CT   Atrial fibrillation ([place])    COVID-19 06/2019   DVT (deep venous thrombosis) ([facility])    Dyspnea    since covid dec 2020   Dysrhythmia    paroxysmal atrial fibrillation   HTN (hypertension)    Lab test positive for detection of COVID-19 virus 06/30/2019   Morbid obesity ([place])    Pedal edema    Renal cyst, acquired, right    Thrombocytopenia ([place])    Past Surgical History:  Procedure Laterality Date   COLONOSCOPY  7-8 years ago   [facility]   COLONOSCOPY WITH PROPOFOL N/A 08/26/2020   Procedure: COLONOSCOPY WITH PROPOFOL;  Surgeon: Toledo, Benay Pike, MD;  Location: [facility];  Service: Gastroenterology;  Laterality: N/A;   CYSTOSCOPY Left 04/03/2020   Procedure: CYSTOSCOPY Birdena Crandall;  Surgeon: [name], MD;  Location: [facility];  Service: Urology;  Laterality: Left;   EYE SURGERY  05/2014, 06/2014   cataracts   HERNIA REPAIR     HYDROCELE EXCISION Right 08/07/2020   Procedure: HYDROCELECTOMY ADULT;  Surgeon: [name], MD;  Location: [facility];  Service: Urology;  Laterality: Right;   INGUINAL HERNIA REPAIR Right 01/19/2015   Procedure: RIGHT INCARCERATED INGUINAL HERNIA REPAIR;  Surgeon: Christene Lye, MD;  Location: [facility];  Service:  General;  Laterality: Right;   TONSILLECTOMY     XI ROBOTIC ASSISTED INGUINAL HERNIA REPAIR WITH MESH Left 04/03/2020   Procedure: XI ROBOTIC ASSISTED INGUINAL HERNIA REPAIR WITH MESH, possible bilateral;  Surgeon: Jules Husbands, MD;  Location: [facility];  Service: General;  Laterality: Left;   Patient Active Problem List   Diagnosis Date Noted   SOB (shortness of breath) on exertion 06/23/2020   Pedal edema 06/08/2020   Renal cyst, acquired, right 06/08/2020   Thrombocytopenia ([place]) 02/11/2020   Acute deep vein thrombosis (DVT) of popliteal vein of right lower extremity ([place]) 07/18/2019   History of 2019 novel coronavirus disease (COVID-19) 07/18/2019   Acute respiratory failure with hypoxia ([place]) 07/04/2019   Atrial fibrillation, chronic ([place]) 07/04/2019   Pneumonia due to COVID-19 virus 07/01/2019   CAP (community acquired pneumonia) 06/30/2019   Medicare annual wellness visit, initial 03/20/2017   Morbid obesity with BMI of 40.0-44.9, adult ([place]) 12/29/2016   History of DVT (deep vein thrombosis) 10/17/2013    ONSET DATE: 12/09/2021  REFERRING DIAG: Parkinson's Disease  THERAPY DIAG:  Muscle weakness (generalized)  Other lack of coordination  Rationale for Evaluation and Treatment: Rehabilitation  SUBJECTIVE:   SUBJECTIVE STATEMENT: Pt reports he is doing well, excited for this to be his last week of the program.  Pt accompanied by: self  PERTINENT  HISTORY:  Pt. is a 72 y.o. male who was diagnosed with Parkinson's Disease in June of 2023.   PRECAUTIONS: None  WEIGHT BEARING RESTRICTIONS: No  PAIN:  Are you having pain? Knee pain 5-6/10 right knee pain  FALLS: Has patient fallen in last 6 months? No  LIVING ENVIRONMENT: Lives with: lives with their spouse Lives in: House/apartment Stairs: 3- 4 steps to enter. 2 story home Has following equipment at home: None  PLOF: Independent  PATIENT GOALS: Regain stability  OBJECTIVE:   HAND DOMINANCE:  Ambidextrous  ADLs: Overall ADLs:   Transfers/ambulation related to ADLs: Eating:  Tremors, uses a spoon instead of a fork Grooming: Difficulty aligning  toothpaste on the tooth brush, difficulty cutting toenails-Consistently goes to a nail salon to have them done.  UB Dressing:  Independent LB Dressing: Independent, increased time to complete Toileting: Independent Bathing: Independent Tub Shower transfers: Independent    IADLs: Shopping: Independent Light housekeeping: No changes noted  Meal Prep: Does not cook, grills Community mobility:  Driving Independently Medication management: Independent with weekly pillbox Financial management: No changes noted Handwriting: 50% legible Name in cursive writing  MOBILITY STATUS: Independent  POSTURE COMMENTS:   Sitting balance:  Good unsupported sitting balance  ACTIVITY TOLERANCE: Activity tolerance: Pt. requires rest breaks during standing assessments  FUNCTIONAL OUTCOME MEASURES:  FOTO: 48 5x's STS: 18 sec. TUG: 11 sec. Freezing of Gait: 9 BERG Balance Scale: 43/56  08/01/2022  FOTO: 50 5x's STS: 14 sec. TUG: 9 sec. Freezing of Gait: 7 BERG Balance Scale: 46/56; Standing one foot in front of the other: This test was scored with the left foot in front of the right: 0, (Pt. Then attempted with the right foot in front of the left with a score of 4)   07/18/2022:   6 minute walk test: 890 ft. with a seated rest break required at the end.  6 minute walk test: 960 ft. with a seated rest break required at the end.   HAND FUNCTION: Grip strength: Right: 68 lbs; Left: 64 lbs and Lateral pinch: Right: 18 lbs, Left: 18 lbs 3pt. Pinch Right: 18 lbs, Left: 17 lbs  Grip strength: Right: 68 lbs; Left: 58 lbs and Lateral pinch: Right: 18 lbs, Left: 18 lbs 3pt. Pinch Right: 16 lbs, Left: 18 lbs  COORDINATION: 9 Hole Peg test: Right: 33 sec; Left: 27 sec  9 Hole Peg test: Right: 34 sec; Left: 28  sec   SENSATION: WFL  EDEMA: N/A  MUSCLE TONE:  WFL  COGNITION: Overall cognitive status: Pt. reports cognitive/memory changes. Pt. being seen by SLP  VISION: Subjective report:  Pt. reports no changes with vision.  VISION ASSESSMENT:  WFL for tasks assessed  PERCEPTION:  TBA  PRAXIS: Impaired: Motor planning  TODAY'S TREATMENT:    DATE: 08/09/2022    LSVT: Patient seen for LSVT Daily Session Maximal Daily Exercises for facilitation/coordination of movement Maximum Sustained Movements are designed to rescale the amplitude of movement output for generalization to daily functional activities. Performed as follows for 1 set of 10 repetitions each multi-directional sustained movements: 1) Floor to ceiling- Pt counting aloud for repetitions, able to perform without cues this date. 2) Side to side multidirectional- Finger flicks were performed for each rep. Cues to turn more to the side to allow greater hip extension.   3) Step and reach forward step- Cues were required for upright midline posture, and to increase arm span.  4) Step and reach sideways step-  Cues for head  turning to the direction patient was stepping. 5) Step and reach backwards step- Improved with a larger steps back posteriorly.  Pt. Was able to complete the movements more fluidly. With no cuing required for Iifting of the toes. 6) Rock and reach forward/backward-Pt. required cues for upright posture. Pt. required cues to coordinate the bilateral UE movements. 7) Rock and reach sideways- Pt. continues to present with limited rotation through the trunk when turning to look over his shoulders. Pt. required cues for UE extension, and to spread digits.   All the above exercises were performed in the STANDARD version without the use of a chair, therapist provided min guard to supervision with all exercises for safety with balance.    Functional Component Task:   1.Sit to stand BIG functional component task for 5 reps  with visual visual demonstration, visual, and tactile cues. Pt. was able to perform the functional component task more fluidly from start to finish. Verbal cues, and visual cues were required for posture, and hand position. 2.Stepping over objects: Continued cues for amplitude of stepping to clear objects 3.Increasing Arm swing: Pt. worked on reps on increasing the amplitude of posterior arm swing during functional mobility 4:Turning/Elongating the trunk:  5: Formulating large letters for his signature: Pt. worked on large/BIG strokes [employer] [place] teams in cursive form with wide lines for visual cuing for the top, and base of the letters to consistently increase the amplitude of the letters, then completed each one in print version.   Pt. worked on bilateral pectoral stretches in standing at the wall followed by sitting. Pt. required cues for visual demonstration, and education about the importance of pectoral stretches in promoting upright midline posture with Parkinson's Disease.   BIG ambulation:     Pt. performed BIG Ambulation with emphasis placed on increasing amplitude of movements for arm swing, and large, long steps. Pt. was able to complete  a total of 725 ft. Pt. worked on [employer] the whole distance with emphasis placed on armswing, and large steps, and marching with high knees.                                                                                                                                  PATIENT EDUCATION: Education details: LSVT MDE's, Goals, POC Person educated: Patient Education method: Explanation, Demonstration, Tactile cues, and Verbal cues Education comprehension: verbalized understanding  HOME EXERCISE PROGRAM:  Maximal daily exercises to be performed 2 times a day.    GOALS: Goals reviewed with patient? Yes  SHORT TERM GOALS: Target date: 08/04/2022    Pt. Will be independent with HEPs for Maximal Daily Exercises,  including modifications. Baseline: 08/01/2022: Pt. Was provided with the adapted version of MDE initially, and upgraded to the Standard version. Pt. Continues to require cues, and visual demonstration/modeling of the exercises. Eval: No current HEP. Training to begin for the MDE on the next  visit. Goal status: INITIAL    LONG TERM GOALS: Target date: 08/25/2022    Pt. Will be independent with the Established LSVT  Functional component, and Hierarchy tasks.  Baseline: 08/01/2022: pt. Requires continued work on the optimizing, and increasing the amplitude for the Functional component, and Hierarchy tasks. Eval: Functional component tasks, and hierarchy tasks to be established Goal status: Ongoing  2.  Pt. Will improve the BERG Balance score by 5 points to reduce fall risk during daily tasks. Baseline: 08/01/2022: BERG: 46/56 Eval: BERG Balance score: 43/56 Goal status: Ongoing    3.  Pt. Will improve the Freezing of Gait score by 3 points.  Baseline: 08/01/2022: FOG score: 7 Eval: FOG score: 9 Goal status: Ongoing  4.  Pt. Increase the FOTO score by 3 points for Pt. perceived improvement with assessment specific ADLs, and IADL tasks.  Baseline: 08/01/2022: FOTO: 50 Eval: FOTO score: 48 Goal status:Ongoing  5.  Pt. Will write his name with 75% legibility in preparation for signing documents.  Baseline: 08/01/2022: 50% legibility for signing his name. Eval: Pt. signed his name with 50% legibility. Goal status: Ongoing    ASSESSMENT:  CLINICAL IMPRESSION:  Pt. was able to perform all of the maximal daily exercises in the standard form this date.  Pt. continues to require consistent verbal cues, tactile cues, visual demonstration, and modeling of each exercise however when prompted to think of the exercise first he is able to recall in the correct form.  Pt. Continues to require cues for upright posture when standing and reaching. Pt. was able to tolerate the addition of  bilateral hand flicks  to all seated exercises. Pt's handwriting has continued to improve with letter size, formation and legibility especially in the printed version.  Continue to educate pt on the importance of follow through with home program, two times a day to see the maximum benefit.  Pt. continues to benefit from OT services for the LSVT BIG program to specifically focus on increasing the amplitude of movements for improved UE functioning, balance, and safety during ADLs, and IADL tasks while reducing overall fall risk.                                      PERFORMANCE DEFICITS: in functional skills including ADLs, IADLs, coordination, ROM, strength, Fine motor control, and Gross motor control, cognitive skills including memory and problem solving, and psychosocial skills including coping strategies, environmental adaptation, habits, interpersonal interactions, and routines and behaviors.   IMPAIRMENTS: are limiting patient from ADLs, IADLs, and leisure.   CO-MORBIDITIES: may have co-morbidities  that affects occupational performance. Patient will benefit from skilled OT to address above impairments and improve overall function.  MODIFICATION OR ASSISTANCE TO COMPLETE EVALUATION: Min-Moderate modification of tasks or assist with assess necessary to complete an evaluation.  OT OCCUPATIONAL PROFILE AND HISTORY: Detailed assessment: Review of records and additional review of physical, cognitive, psychosocial history related to current functional performance.  CLINICAL DECISION MAKING: Moderate - several treatment options, min-mod task modification necessary  REHAB POTENTIAL: Good  EVALUATION COMPLEXITY: Moderate   PLAN:  OT FREQUENCY: 4x/week  OT DURATION: 6 weeks  PLANNED INTERVENTIONS: self care/ADL training, therapeutic exercise, therapeutic activity, neuromuscular re-education, manual therapy, passive range of motion, functional mobility training, moist heat, patient/family education, cognitive  remediation/compensation, and DME and/or AE instructions  RECOMMENDED OTHER SERVICES: SLP  CONSULTED AND AGREED WITH PLAN OF CARE: Patient  and family member/caregiver  PLAN FOR NEXT SESSION:  continue to treatment, and refining MDE's  Achilles Dunk, OTR/L, CLT  Amoni Scallan, OT 08/10/2022, 4:33 PM

## 2022-08-11 ENCOUNTER — Ambulatory Visit: Payer: Medicare HMO | Attending: Neurology | Admitting: Occupational Therapy

## 2022-08-11 DIAGNOSIS — R278 Other lack of coordination: Secondary | ICD-10-CM | POA: Insufficient documentation

## 2022-08-11 DIAGNOSIS — M6281 Muscle weakness (generalized): Secondary | ICD-10-CM | POA: Diagnosis present

## 2022-08-11 NOTE — Therapy (Signed)
OUTPATIENT OCCUPATIONAL THERAPY NEURO/LSVT TREATMENT/DISCHARGE NOTE  Patient Name: Neil Bailey MRN: [id number] DOB:06/13/1951, 72 y.o., male   PCP: Emily Filbert, MD REFERRING PROVIDER: Jennings Books, MD  END OF SESSION:  OT End of Session - 08/11/22 1704     Visit Number 17    Number of Visits 17    Date for OT Re-Evaluation 08/25/22    Authorization Time Period Progress report period starting 07/14/2022    OT Start Time 45    OT Stop Time 1145    OT Time Calculation (min) 45 min    Activity Tolerance Patient tolerated treatment well    Behavior During Therapy [facility] for tasks assessed/performed               Past Medical History:  Diagnosis Date   Aortic atherosclerosis ([place]) 03/09/2020   Noted on abdominal CT   Atrial fibrillation ([place])    COVID-19 06/2019   DVT (deep venous thrombosis) ([facility])    Dyspnea    since covid dec 2020   Dysrhythmia    paroxysmal atrial fibrillation   HTN (hypertension)    Lab test positive for detection of COVID-19 virus 06/30/2019   Morbid obesity ([place])    Pedal edema    Renal cyst, acquired, right    Thrombocytopenia ([place])    Past Surgical History:  Procedure Laterality Date   COLONOSCOPY  7-8 years ago   [facility]   COLONOSCOPY WITH PROPOFOL N/A 08/26/2020   Procedure: COLONOSCOPY WITH PROPOFOL;  Surgeon: Toledo, Benay Pike, MD;  Location: [facility] ENDOSCOPY;  Service: Gastroenterology;  Laterality: N/A;   CYSTOSCOPY Left 04/03/2020   Procedure: CYSTOSCOPY Birdena Crandall;  Surgeon: [name], MD;  Location: [facility] ORS;  Service: Urology;  Laterality: Left;   EYE SURGERY  05/2014, 06/2014   cataracts   HERNIA REPAIR     HYDROCELE EXCISION Right 08/07/2020   Procedure: HYDROCELECTOMY ADULT;  Surgeon: [name], MD;  Location: [facility] ORS;  Service: Urology;  Laterality: Right;   INGUINAL HERNIA REPAIR Right 01/19/2015   Procedure: RIGHT INCARCERATED INGUINAL HERNIA REPAIR;  Surgeon: Christene Lye, MD;  Location: [facility] ORS;   Service: General;  Laterality: Right;   TONSILLECTOMY     XI ROBOTIC ASSISTED INGUINAL HERNIA REPAIR WITH MESH Left 04/03/2020   Procedure: XI ROBOTIC ASSISTED INGUINAL HERNIA REPAIR WITH MESH, possible bilateral;  Surgeon: Jules Husbands, MD;  Location: [facility] ORS;  Service: General;  Laterality: Left;   Patient Active Problem List   Diagnosis Date Noted   SOB (shortness of breath) on exertion 06/23/2020   Pedal edema 06/08/2020   Renal cyst, acquired, right 06/08/2020   Thrombocytopenia ([place]) 02/11/2020   Acute deep vein thrombosis (DVT) of popliteal vein of right lower extremity ([place]) 07/18/2019   History of 2019 novel coronavirus disease (COVID-19) 07/18/2019   Acute respiratory failure with hypoxia ([place]) 07/04/2019   Atrial fibrillation, chronic ([place]) 07/04/2019   Pneumonia due to COVID-19 virus 07/01/2019   CAP (community acquired pneumonia) 06/30/2019   Medicare annual wellness visit, initial 03/20/2017   Morbid obesity with BMI of 40.0-44.9, adult ([place]) 12/29/2016   History of DVT (deep vein thrombosis) 10/17/2013    ONSET DATE: 12/09/2021  REFERRING DIAG: Parkinson's Disease  THERAPY DIAG:  Muscle weakness (generalized)  Other lack of coordination  Rationale for Evaluation and Treatment: Rehabilitation  SUBJECTIVE:   SUBJECTIVE STATEMENT: Patient reports  that he feels like he has made progress.   Pt accompanied by: self  PERTINENT HISTORY:  Pt. is  a 72 y.o. male who was diagnosed with Parkinson's Disease in June of 2023.   PRECAUTIONS: None  WEIGHT BEARING RESTRICTIONS: No  PAIN:  Are you having pain? No reports of Pain during the session, however has chronic 5-6/10 right knee pain  FALLS: Has patient fallen in last 6 months? No  LIVING ENVIRONMENT: Lives with: lives with their spouse Lives in: House/apartment Stairs: 3- 4 steps to enter. 2 story home Has following equipment at home: None  PLOF: Independent  PATIENT GOALS: Regain  stability  OBJECTIVE:   HAND DOMINANCE: Ambidextrous  ADLs: Overall ADLs:   Transfers/ambulation related to ADLs: Eating:  Tremors, uses a spoon instead of a fork Grooming: Difficulty aligning  toothpaste on the tooth brush, difficulty cutting toenails-Consistently goes to a nail salon to have them done.  UB Dressing:  Independent LB Dressing: Independent, increased time to complete Toileting: Independent Bathing: Independent Tub Shower transfers: Independent    IADLs: Shopping: Independent Light housekeeping: No changes noted  Meal Prep: Does not cook, grills Community mobility:  Driving Independently Medication management: Independent with weekly pillbox Financial management: No changes noted Handwriting: 50% legible Name in cursive writing  MOBILITY STATUS: Independent  POSTURE COMMENTS:   Sitting balance:  Good unsupported sitting balance  ACTIVITY TOLERANCE: Activity tolerance: Pt. requires rest breaks during standing assessments  FUNCTIONAL OUTCOME MEASURES:  FOTO: 48 5x's STS: 18 sec. TUG: 11 sec. Freezing of Gait: 9 BERG Balance Scale: 43/56  07/18/2022:   6 minute walk test: 890 ft. with a seated rest break required at the end.  08/01/2022  FOTO: 50 5x's STS: 14 sec. TUG: 9 sec. Freezing of Gait: 7 BERG Balance Scale: 46/56; Standing one foot in front of the other: This test was scored with the left foot in front of the right: 0, (Pt. Then attempted with the right foot in front of the left with a score of 4)  6 minute walk test: 960 ft. with a seated rest break required at the end.  08/11/2022  FOTO: 55 5x's STS: 17 sec. TUG: 10 sec. Freezing of Gait: 7 BERG Balance Scale: 50/56  6 minute walk test: 985 ft. with a seated rest break required at the end.   HAND FUNCTION: Grip strength: Right: 68 lbs; Left: 64 lbs and Lateral pinch: Right: 18 lbs, Left: 18 lbs 3pt. Pinch Right: 18 lbs, Left: 17 lbs  08/01/2022: Grip strength: Right: 68 lbs;  Left: 58 lbs and Lateral pinch: Right: 18 lbs, Left: 18 lbs 3pt. Pinch Right: 16 lbs, Left: 18 lbs  08/12/2022: Grip strength: Right: 55 lbs; Left: 58 lbs and Lateral pinch: Right: 21 lbs, Left: 22 lbs 3pt. Pinch Right: 21 lbs, Left: 20 lbs  COORDINATION: 9 Hole Peg test: Right: 33 sec; Left: 27 sec  08/01/2022: 9 Hole Peg test: Right: 34 sec; Left: 28 sec  08/12/2022: 9 Hole Peg test: Right: 30  sec; Left: 26 sec    SENSATION: WFL  EDEMA: N/A  MUSCLE TONE:  WFL  COGNITION: Overall cognitive status: Pt. reports cognitive/memory changes. Pt. being seen by SLP  VISION: Subjective report:  Pt. reports no changes with vision.  VISION ASSESSMENT:  WFL for tasks assessed  PERCEPTION:  TBA  PRAXIS: Impaired: Motor planning  TODAY'S TREATMENT:    DATE:  08/11/2022  Measurements were obtained, and goals were reviewed with the Pt. Pt. has made excellent progress with strength, [place] via the 9 hole peg test, balance. Pt. has improved with walking speed, ambulating with increased amplitude  with notably decreased freezing, and shuffling. Pt. Presents with improved posture, as well as stability and fluidity of movement during sit to stand. Pt presents with improved writing legibility while signing his name, however does continue to present with micrographia near the end of his last name. Pt. does continue to present with difficulty balancing on one foot, or assuming standing positions with one foot directly in front of the other. Pt.'s FOTO score has progressed to 55. Pt. Is now ready for discharge. Pt. has completed the LSVT program as prescribed and Pt. Is now ready for discharge from the program.                                                                                                                              PATIENT EDUCATION: Education details: LSVT MDE's, Goals, POC Person educated: Patient Education method: Explanation, Demonstration, Tactile cues, and Verbal cues Education  comprehension: verbalized understanding  HOME EXERCISE PROGRAM:  Maximal daily exercises to be performed 2 times a day.    GOALS: Goals reviewed with patient? Yes  SHORT TERM GOALS: Target date: 08/04/2022    Pt. Will be independent with HEPs for Maximal Daily Exercises, including modifications. Baseline: 08/11/2022:  Cues to consistently complete 2 sets daily 08/01/2022: Pt. Was provided with the adapted version of MDE initially, and upgraded to the Standard version. Pt. Continues to require cues, and visual demonstration/modeling of the exercises. Eval: No current HEP. Training to begin for the MDE on the next visit. Goal status: INITIAL    LONG TERM GOALS: Target date: 08/25/2022    Pt. Will be independent with the Established LSVT  Functional component, and Hierarchy tasks.  Baseline: 08/11/2022: Independent.08/01/2022: pt. Requires continued work on the optimizing, and increasing the amplitude for the Functional component, and Hierarchy tasks. Eval: Functional component tasks, and hierarchy tasks to be established Goal status: Achieved  2.  Pt. Will improve the BERG Balance score by 5 points to reduce fall risk during daily tasks. Baseline: 08/11/2022: 50/56 08/01/2022: BERG: 46/56 Eval: BERG Balance score: 43/56 Goal status: Achieved  3.  Pt. Will improve the Freezing of Gait score by 3 points.  Baseline: 08/11/2022: FOG score1/22/2024: 7 FOG score: 7 Eval: FOG score: 9 Goal status: Partially met  4.  Pt. Increase the FOTO score by 3 points for Pt. perceived improvement with assessment specific ADLs, and IADL tasks.  Baseline: 08/11/2022: FOTO 55 08/01/2022: FOTO: 50 Eval: FOTO score: 48 Goal status: Achieved  5.  Pt. Will write his name with 75% legibility in preparation for signing documents.  Baseline: 08/11/2022: 75% legibility 08/01/2022: 50% legibility for signing his name. Eval: Pt. signed his name with 50% legibility. Goal status:  Achieved.    ASSESSMENT:  CLINICAL  IMPRESSION:               Measurements were obtained, and goals were reviewed with the Pt. Pt. has made excellent progress with strength, [place] via the 9 hole  peg test, balance. Pt. has improved with walking speed, ambulating with increased amplitude with notably decreased freezing, and shuffling. Pt. Presents with improved posture, as well as stability and fluidity of movement during sit to stand. Pt presents with improved writing legibility while signing his name, however does continue to present with micrographia near the end of his last name when writing in cursive. Pt. does continue to present with difficulty balancing on one foot, or assuming standing positions with one foot directly in front of the other. Pt.'s FOTO score has progressed to 55. Pt. Is now ready for discharge. Pt. has completed the LSVT program as prescribed and Pt. Is now ready for discharge from the program.               PERFORMANCE DEFICITS: in functional skills including ADLs, IADLs, coordination, ROM, strength, Fine motor control, and Gross motor control, cognitive skills including memory and problem solving, and psychosocial skills including coping strategies, environmental adaptation, habits, interpersonal interactions, and routines and behaviors.   IMPAIRMENTS: are limiting patient from ADLs, IADLs, and leisure.   CO-MORBIDITIES: may have co-morbidities  that affects occupational performance. Patient will benefit from skilled OT to address above impairments and improve overall function.  MODIFICATION OR ASSISTANCE TO COMPLETE EVALUATION: Min-Moderate modification of tasks or assist with assess necessary to complete an evaluation.  OT OCCUPATIONAL PROFILE AND HISTORY: Detailed assessment: Review of records and additional review of physical, cognitive, psychosocial history related to current functional performance.  CLINICAL DECISION MAKING: Moderate - several treatment options, min-mod task modification necessary  REHAB  POTENTIAL: Good  EVALUATION COMPLEXITY: Moderate   PLAN:  OT FREQUENCY: 4x/week  OT DURATION: 6 weeks  PLANNED INTERVENTIONS: self care/ADL training, therapeutic exercise, therapeutic activity, neuromuscular re-education, manual therapy, passive range of motion, functional mobility training, moist heat, patient/family education, cognitive remediation/compensation, and DME and/or AE instructions  RECOMMENDED OTHER SERVICES: SLP  CONSULTED AND AGREED WITH PLAN OF CARE: Patient and family member/caregiver  PLAN FOR NEXT SESSION:  continue to treatment, and refining MDE's Harrel Carina, MS, OTR/L   Harrel Carina, OT 08/11/2022, 5:12 PM

## 2022-08-12 ENCOUNTER — Encounter: Payer: Medicare HMO | Admitting: Occupational Therapy

## 2022-08-12 ENCOUNTER — Encounter: Payer: Medicare HMO | Admitting: Speech Pathology

## 2022-08-15 ENCOUNTER — Ambulatory Visit: Payer: Medicare HMO | Admitting: Speech Pathology

## 2022-08-15 ENCOUNTER — Ambulatory Visit: Payer: Medicare HMO | Admitting: Occupational Therapy

## 2022-08-17 ENCOUNTER — Ambulatory Visit: Payer: Medicare HMO | Admitting: Speech Pathology

## 2022-08-22 ENCOUNTER — Ambulatory Visit: Payer: Medicare HMO | Admitting: Speech Pathology

## 2022-08-24 ENCOUNTER — Encounter: Payer: Medicare HMO | Admitting: Speech Pathology

## 2022-08-29 ENCOUNTER — Encounter: Payer: Medicare HMO | Admitting: Speech Pathology

## 2022-08-31 ENCOUNTER — Encounter: Payer: Medicare HMO | Admitting: Speech Pathology

## 2022-09-05 ENCOUNTER — Encounter: Payer: Medicare HMO | Admitting: Speech Pathology

## 2022-09-07 ENCOUNTER — Encounter: Payer: Medicare HMO | Admitting: Speech Pathology

## 2022-09-12 ENCOUNTER — Encounter: Payer: Medicare HMO | Admitting: Speech Pathology

## 2022-09-14 ENCOUNTER — Encounter: Payer: Medicare HMO | Admitting: Speech Pathology

## 2022-09-19 ENCOUNTER — Encounter: Payer: Medicare HMO | Admitting: Speech Pathology

## 2022-09-21 ENCOUNTER — Encounter: Payer: Medicare HMO | Admitting: Speech Pathology

## 2022-09-26 ENCOUNTER — Encounter: Payer: Medicare HMO | Admitting: Speech Pathology

## 2022-09-28 ENCOUNTER — Encounter: Payer: Medicare HMO | Admitting: Speech Pathology

## 2022-10-03 ENCOUNTER — Encounter: Payer: Medicare HMO | Admitting: Speech Pathology

## 2022-10-05 ENCOUNTER — Encounter: Payer: Medicare HMO | Admitting: Speech Pathology

## 2022-10-10 ENCOUNTER — Encounter: Payer: Medicare HMO | Admitting: Speech Pathology

## 2022-10-12 ENCOUNTER — Encounter: Payer: Medicare HMO | Admitting: Speech Pathology

## 2022-11-17 ENCOUNTER — Emergency Department: Payer: Medicare HMO

## 2022-11-17 ENCOUNTER — Inpatient Hospital Stay
Admission: EM | Admit: 2022-11-17 | Discharge: 2022-11-19 | DRG: 871 | Disposition: A | Discharge status: Home / Self Care | Payer: Medicare HMO | Attending: Internal Medicine | Admitting: Internal Medicine

## 2022-11-17 ENCOUNTER — Other Ambulatory Visit: Payer: Self-pay

## 2022-11-17 ENCOUNTER — Encounter: Payer: Self-pay | Admitting: Emergency Medicine

## 2022-11-17 ENCOUNTER — Observation Stay: Payer: Medicare HMO

## 2022-11-17 DIAGNOSIS — R531 Weakness: Secondary | ICD-10-CM

## 2022-11-17 DIAGNOSIS — Z87891 Personal history of nicotine dependence: Secondary | ICD-10-CM

## 2022-11-17 DIAGNOSIS — I4891 Unspecified atrial fibrillation: Secondary | ICD-10-CM

## 2022-11-17 DIAGNOSIS — R8271 Bacteriuria: Secondary | ICD-10-CM

## 2022-11-17 DIAGNOSIS — I4821 Permanent atrial fibrillation: Secondary | ICD-10-CM | POA: Diagnosis present

## 2022-11-17 DIAGNOSIS — A419 Sepsis, unspecified organism: Secondary | ICD-10-CM | POA: Diagnosis not present

## 2022-11-17 DIAGNOSIS — G20A1 Parkinson's disease without dyskinesia, without mention of fluctuations: Secondary | ICD-10-CM | POA: Diagnosis present

## 2022-11-17 DIAGNOSIS — R058 Other specified cough: Secondary | ICD-10-CM

## 2022-11-17 DIAGNOSIS — J189 Pneumonia, unspecified organism: Secondary | ICD-10-CM | POA: Diagnosis present

## 2022-11-17 DIAGNOSIS — I110 Hypertensive heart disease with heart failure: Secondary | ICD-10-CM | POA: Diagnosis present

## 2022-11-17 DIAGNOSIS — D696 Thrombocytopenia, unspecified: Secondary | ICD-10-CM | POA: Diagnosis present

## 2022-11-17 DIAGNOSIS — Z86718 Personal history of other venous thrombosis and embolism: Secondary | ICD-10-CM

## 2022-11-17 DIAGNOSIS — J019 Acute sinusitis, unspecified: Secondary | ICD-10-CM | POA: Diagnosis present

## 2022-11-17 DIAGNOSIS — Z66 Do not resuscitate: Secondary | ICD-10-CM | POA: Diagnosis present

## 2022-11-17 DIAGNOSIS — I509 Heart failure, unspecified: Secondary | ICD-10-CM | POA: Diagnosis present

## 2022-11-17 DIAGNOSIS — Z79899 Other long term (current) drug therapy: Secondary | ICD-10-CM

## 2022-11-17 DIAGNOSIS — J0140 Acute pansinusitis, unspecified: Secondary | ICD-10-CM

## 2022-11-17 DIAGNOSIS — Z7901 Long term (current) use of anticoagulants: Secondary | ICD-10-CM

## 2022-11-17 DIAGNOSIS — Z1152 Encounter for screening for COVID-19: Secondary | ICD-10-CM

## 2022-11-17 DIAGNOSIS — Z8616 Personal history of COVID-19: Secondary | ICD-10-CM

## 2022-11-17 DIAGNOSIS — B348 Other viral infections of unspecified site: Secondary | ICD-10-CM | POA: Diagnosis present

## 2022-11-17 HISTORY — DX: Parkinson's disease without dyskinesia, without mention of fluctuations: G20.A1

## 2022-11-17 LAB — BASIC METABOLIC PANEL
Anion gap: 11 (ref 5–15)
BUN: 16 mg/dL (ref 8–23)
CO2: 22 mmol/L (ref 22–32)
Calcium: 8.4 mg/dL — ABNORMAL LOW (ref 8.9–10.3)
Chloride: 104 mmol/L (ref 98–111)
Creatinine, Ser: 1.11 mg/dL (ref 0.61–1.24)
GFR, Estimated: >60 mL/min (ref >60)
Glucose, Bld: 119 mg/dL — ABNORMAL HIGH (ref 70–99)
Potassium: 3.6 mmol/L (ref 3.5–5.1)
Sodium: 137 mmol/L (ref 135–145)

## 2022-11-17 LAB — CBC
HCT: 46.9 % (ref 39.0–52.0)
Hemoglobin: 14.8 g/dL (ref 13.0–17.0)
MCH: 27.2 pg (ref 26.0–34.0)
MCHC: 31.6 g/dL (ref 30.0–36.0)
MCV: 86.2 fL (ref 80.0–100.0)
Platelets: 108 10*3/uL — ABNORMAL LOW (ref 150–400)
RBC: 5.44 MIL/uL (ref 4.22–5.81)
RDW: 14.7 % (ref 11.5–15.5)
WBC: 13.6 10*3/uL — ABNORMAL HIGH (ref 4.0–10.5)
nRBC: 0 % (ref 0.0–0.2)

## 2022-11-17 LAB — SARS CORONAVIRUS 2 BY RT PCR: SARS Coronavirus 2 by RT PCR: NEGATIVE

## 2022-11-17 LAB — URINALYSIS, ROUTINE W REFLEX MICROSCOPIC
Bilirubin Urine: NEGATIVE
Glucose, UA: NEGATIVE mg/dL
Ketones, ur: 5 mg/dL — AB
Leukocytes,Ua: NEGATIVE
Nitrite: NEGATIVE
Protein, ur: 30 mg/dL — AB
Specific Gravity, Urine: 1.015 (ref 1.005–1.030)
pH: 5 (ref 5.0–8.0)

## 2022-11-17 LAB — PROTIME-INR
INR: 2 — ABNORMAL HIGH (ref 0.8–1.2)
Prothrombin Time: 23.3 seconds — ABNORMAL HIGH (ref 11.4–15.2)

## 2022-11-17 LAB — LACTIC ACID, PLASMA
Lactic Acid, Venous: 1.5 mmol/L (ref 0.5–1.9)
Lactic Acid, Venous: 1.3 mmol/L (ref 0.5–1.9)

## 2022-11-17 LAB — PROCALCITONIN: Procalcitonin: 0.2 ng/mL

## 2022-11-17 LAB — MAGNESIUM: Magnesium: 2 mg/dL (ref 1.7–2.4)

## 2022-11-17 LAB — HEPATIC FUNCTION PANEL
ALT: 14 U/L (ref 0–44)
AST: 18 U/L (ref 15–41)
Albumin: 4.1 g/dL (ref 3.5–5.0)
Alkaline Phosphatase: 53 U/L (ref 38–126)
Bilirubin, Direct: 0.3 mg/dL — ABNORMAL HIGH (ref 0.0–0.2)
Indirect Bilirubin: 1.1 mg/dL — ABNORMAL HIGH (ref 0.3–0.9)
Total Bilirubin: 1.4 mg/dL — ABNORMAL HIGH (ref 0.3–1.2)
Total Protein: 7.5 g/dL (ref 6.5–8.1)

## 2022-11-17 LAB — TROPONIN I (HIGH SENSITIVITY)
Troponin I (High Sensitivity): 12 ng/L (ref <18)
Troponin I (High Sensitivity): 17 ng/L (ref <18)

## 2022-11-17 LAB — BRAIN NATRIURETIC PEPTIDE: B Natriuretic Peptide: 179.5 pg/mL — ABNORMAL HIGH (ref 0.0–100.0)

## 2022-11-17 LAB — PHOSPHORUS: Phosphorus: 1.6 mg/dL — ABNORMAL LOW (ref 2.5–4.6)

## 2022-11-17 MED ORDER — WARFARIN SODIUM 7.5 MG PO TABS: 7.5000 mg | ORAL_TABLET | ORAL | Status: DC

## 2022-11-17 MED ORDER — FUROSEMIDE 40 MG PO TABS
20.0000 mg | ORAL_TABLET | Freq: Every day | ORAL | Status: DC
  Administered 2022-11-18 – 2022-11-19 (×2): 20 mg via ORAL
  Filled 2022-11-17 (×2): qty 1

## 2022-11-17 MED ORDER — GUAIFENESIN-DM 100-10 MG/5ML PO SYRP
5.0000 mL | ORAL_SOLUTION | Freq: Four times a day (QID) | ORAL | Status: DC | PRN
  Administered 2022-11-18 (×2): 5 mL via ORAL
  Filled 2022-11-17 (×2): qty 10

## 2022-11-17 MED ORDER — PANTOPRAZOLE SODIUM 40 MG PO TBEC
40.0000 mg | DELAYED_RELEASE_TABLET | Freq: Every day | ORAL | Status: DC
  Administered 2022-11-18 – 2022-11-19 (×2): 40 mg via ORAL
  Filled 2022-11-17 (×2): qty 1

## 2022-11-17 MED ORDER — WARFARIN SODIUM 7.5 MG PO TABS
7.5000 mg | ORAL_TABLET | Freq: Once | ORAL | Status: AC
  Administered 2022-11-17: 7.5 mg via ORAL
  Filled 2022-11-17: qty 1

## 2022-11-17 MED ORDER — ACETAMINOPHEN 325 MG PO TABS: 650.0000 mg | ORAL_TABLET | Freq: Four times a day (QID) | ORAL | Status: DC | PRN

## 2022-11-17 MED ORDER — ONDANSETRON HCL 4 MG PO TABS: 4.0000 mg | ORAL_TABLET | Freq: Four times a day (QID) | ORAL | Status: DC | PRN

## 2022-11-17 MED ORDER — METOPROLOL SUCCINATE ER 25 MG PO TB24
12.5000 mg | ORAL_TABLET | Freq: Every day | ORAL | Status: DC
  Administered 2022-11-18 – 2022-11-19 (×2): 12.5 mg via ORAL
  Filled 2022-11-17 (×2): qty 1

## 2022-11-17 MED ORDER — WARFARIN SODIUM 5 MG PO TABS
5.0000 mg | ORAL_TABLET | Freq: Once | ORAL | Status: DC
  Filled 2022-11-17: qty 1

## 2022-11-17 MED ORDER — WARFARIN - PHARMACIST DOSING INPATIENT
Freq: Every day | Status: DC
  Filled 2022-11-17: qty 1

## 2022-11-17 MED ORDER — POTASSIUM CHLORIDE CRYS ER 20 MEQ PO TBCR
10.0000 meq | EXTENDED_RELEASE_TABLET | Freq: Every day | ORAL | Status: DC
  Administered 2022-11-17 – 2022-11-19 (×3): 10 meq via ORAL
  Filled 2022-11-17 (×3): qty 1

## 2022-11-17 MED ORDER — ACETAMINOPHEN 325 MG PO TABS
650.0000 mg | ORAL_TABLET | Freq: Once | ORAL | Status: AC
  Administered 2022-11-17: 650 mg via ORAL
  Filled 2022-11-17: qty 2

## 2022-11-17 MED ORDER — ONDANSETRON HCL 4 MG/2ML IJ SOLN: 4.0000 mg | Freq: Four times a day (QID) | INTRAMUSCULAR | Status: DC | PRN

## 2022-11-17 MED ORDER — METOPROLOL TARTRATE 25 MG PO TABS
25.0000 mg | ORAL_TABLET | Freq: Once | ORAL | Status: AC
  Administered 2022-11-17: 25 mg via ORAL
  Filled 2022-11-17: qty 1

## 2022-11-17 MED ORDER — SODIUM CHLORIDE 0.9 % IV SOLN
1.0000 g | INTRAVENOUS | Status: DC
  Administered 2022-11-18: 1 g via INTRAVENOUS
  Filled 2022-11-17: qty 10

## 2022-11-17 MED ORDER — SODIUM CHLORIDE 0.9 % IV BOLUS
1000.0000 mL | Freq: Once | INTRAVENOUS | Status: AC
  Administered 2022-11-17: 1000 mL via INTRAVENOUS

## 2022-11-17 MED ORDER — WARFARIN SODIUM 5 MG PO TABS
5.0000 mg | ORAL_TABLET | ORAL | Status: DC
  Administered 2022-11-18: 5 mg via ORAL
  Filled 2022-11-17: qty 1

## 2022-11-17 MED ORDER — SODIUM CHLORIDE 0.9 % IV SOLN
500.0000 mg | Freq: Once | INTRAVENOUS | Status: AC
  Administered 2022-11-17: 500 mg via INTRAVENOUS
  Filled 2022-11-17: qty 5

## 2022-11-17 MED ORDER — SODIUM CHLORIDE 0.9 % IV SOLN
500.0000 mg | INTRAVENOUS | Status: DC
  Administered 2022-11-18: 500 mg via INTRAVENOUS
  Filled 2022-11-17: qty 5

## 2022-11-17 MED ORDER — CARBIDOPA-LEVODOPA 25-100 MG PO TABS
1.5000 | ORAL_TABLET | Freq: Two times a day (BID) | ORAL | Status: DC
  Administered 2022-11-17 – 2022-11-19 (×4): 1.5 via ORAL
  Filled 2022-11-17 (×4): qty 1.5

## 2022-11-17 MED ORDER — SODIUM CHLORIDE 0.9 % IV SOLN
1.0000 g | Freq: Once | INTRAVENOUS | Status: AC
  Administered 2022-11-17: 1 g via INTRAVENOUS
  Filled 2022-11-17: qty 10

## 2022-11-17 MED ORDER — ACETAMINOPHEN 650 MG RE SUPP: 650.0000 mg | Freq: Four times a day (QID) | RECTAL | Status: DC | PRN

## 2022-11-17 MED ORDER — FLUTICASONE PROPIONATE 50 MCG/ACT NA SUSP
1.0000 | Freq: Every day | NASAL | Status: DC
  Administered 2022-11-17 – 2022-11-18 (×2): 1 via NASAL
  Filled 2022-11-17 (×2): qty 16

## 2022-11-17 NOTE — H&P (Addendum)
History and Physical:    Neil Bailey   ZOX:[id number] DOB: [birth year] DOA: 11/17/2022  Referring MD/provider: Abagail Kitchens, MD PCP: Danella Penton, MD   Patient coming from: Home  Chief Complaint: General weakness  History of Present Illness:   Neil Bailey is a 72 y.o. male with medical history significant for atrial fibrillation on warfarin, remote history of right lower extremity DVT, hypertension, morbid obesity, Parkinson's disease, thrombocytopenia, aortic atherosclerosis, history of COVID-19 infection, who presented to the hospital with multiple complaints.  His complaints include general weakness, fatigue, lack of energy, unsteady gait, cough productive of greenish sputum, congestion, runny nose and sneezing.  Symptoms this started about 3 days prior to admission and have progressively worsened.  On the day of admission, he was so weak that he laid down on the bedroom floor.  His wife found him on the floor and insisted that he come to the emergency department for further management.  He admits to feeling short of breath.  No chest pain, vomiting, diarrhea, abdominal pain, fever or chills.  ED Course:  The patient was seen and rapid atrial fibrillation in the ED.  He was given a liter of normal saline, IV ceftriaxone and oral metoprolol.  ROS:   ROS all other systems reviewed were negative  Past Medical History:   Past Medical History:  Diagnosis Date   Aortic atherosclerosis (HCC) 03/09/2020   Noted on abdominal CT   Atrial fibrillation (HCC)    COVID-19 06/2019   DVT (deep venous thrombosis) (HCC)    Dyspnea    since covid dec 2020   Dysrhythmia    paroxysmal atrial fibrillation   HTN (hypertension)    Lab test positive for detection of COVID-19 virus 06/30/2019   Morbid obesity (HCC)    Parkinson disease    Pedal edema    Renal cyst, acquired, right    Thrombocytopenia (HCC)     Past Surgical History:   Past Surgical History:  Procedure  Laterality Date   COLONOSCOPY  7-8 years ago   [facility]   COLONOSCOPY WITH PROPOFOL N/A 08/26/2020   Procedure: COLONOSCOPY WITH PROPOFOL;  Surgeon: Toledo, Boykin Nearing, MD;  Location: [facility] ENDOSCOPY;  Service: Gastroenterology;  Laterality: N/A;   CYSTOSCOPY Left 04/03/2020   Procedure: CYSTOSCOPY Wayna Chalet;  Surgeon: Sondra Come, MD;  Location: [facility] ORS;  Service: Urology;  Laterality: Left;   EYE SURGERY  05/2014, 06/2014   cataracts   HERNIA REPAIR     HYDROCELE EXCISION Right 08/07/2020   Procedure: HYDROCELECTOMY ADULT;  Surgeon: Sondra Come, MD;  Location: [facility] ORS;  Service: Urology;  Laterality: Right;   INGUINAL HERNIA REPAIR Right 01/19/2015   Procedure: RIGHT INCARCERATED INGUINAL HERNIA REPAIR;  Surgeon: Kieth Brightly, MD;  Location: [facility] ORS;  Service: General;  Laterality: Right;   TONSILLECTOMY     XI ROBOTIC ASSISTED INGUINAL HERNIA REPAIR WITH MESH Left 04/03/2020   Procedure: XI ROBOTIC ASSISTED INGUINAL HERNIA REPAIR WITH MESH, possible bilateral;  Surgeon: Leafy Ro, MD;  Location: [facility] ORS;  Service: General;  Laterality: Left;    Social History:   Social History   Socioeconomic History   Marital status: Married    Spouse name: Not on file   Number of children: Not on file   Years of education: Not on file   Highest education level: Not on file  Occupational History   Not on file  Tobacco Use   Smoking status: Former    Packs/day: 1.50  Years: 28.00    Additional pack years: 0.00    Total pack years: 42.00    Types: Cigarettes    Quit date: 07/11/1986    Years since quitting: 36.3    Passive exposure: Past   Smokeless tobacco: Never  Vaping Use   Vaping Use: Never used  Substance and Sexual Activity   Alcohol use: Yes    Alcohol/week: 0.0 standard drinks of alcohol    Comment: occ   Drug use: No   Sexual activity: Yes  Other Topics Concern   Not on file  Social History Narrative   Not on file   Social Determinants of Health    Financial Resource Strain: Not on file  Food Insecurity: Not on file  Transportation Needs: Not on file  Physical Activity: Not on file  Stress: Not on file  Social Connections: Not on file  Intimate Partner Violence: Not on file    Allergies   Patient has no known allergies.  Family history:   Family History  Problem Relation Age of Onset   Dementia Mother     Current Medications:   Prior to Admission medications   Medication Sig Start Date End Date Taking? Authorizing Provider  SODIUM FLUORIDE 5000 PPM 1.1 % PSTE Take by mouth. 11/09/22  Yes [provider]  carbidopa-levodopa (SINEMET IR) 25-100 MG tablet Take 1 tablet by mouth 3 (three) times daily. 03/21/22   [provider]  celecoxib (CELEBREX) 200 MG capsule Take 200 mg by mouth 2 (two) times daily. 03/18/22   [provider]  furosemide (LASIX) 20 MG tablet Take 20 mg by mouth daily.    [provider]  hydrochlorothiazide (HYDRODIURIL) 25 MG tablet Take 25 mg by mouth daily. Patient not taking: Reported on 07/22/2022    [provider]  metoprolol succinate (TOPROL-XL) 25 MG 24 hr tablet Take 12.5 mg by mouth daily. 03/06/21   [provider]  pantoprazole (PROTONIX) 40 MG tablet Take 40 mg by mouth daily. 03/16/22   [provider]  potassium chloride (KLOR-CON) 10 MEQ tablet Take 10 mEq by mouth daily. 05/28/20   [provider]  warfarin (COUMADIN) 5 MG tablet Take 5-7.5 mg by mouth See admin instructions. 5 mg on Wed and Sat, all other days 7.5 mg 07/13/20   [provider]    Physical Exam:   Vitals:   11/17/22 1600 11/17/22 1700 11/17/22 1733 11/17/22 1800  BP: 109/77 105/66 116/67 98/62  Pulse: (!) 117 (!) 118 (!) 110 (!) 105  Resp: (!) 31     Temp:      TempSrc:      SpO2: 97% 97% 100% 94%     Physical Exam: Blood pressure 98/62, pulse (!) 105, temperature 100 F (37.8 C), temperature source Oral, resp. rate (!) 31, SpO2 94  %. Gen: No acute distress. Head: Normocephalic, atraumatic. Eyes: Pupils equal, round and reactive to light. Extraocular movements intact.  Sclerae nonicteric.  Mouth: Moist mucous membranes Neck: Supple, no thyromegaly, no lymphadenopathy, no jugular venous distention. Chest: Bibasilar rales.  No wheezing heard CV: Irregular heart rate and rhythm.  No murmurs, rubs or gallops.  Abdomen: Soft, nontender, obese with normal active bowel sounds. No palpable masses. Extremities: Extremities are without clubbing, or cyanosis. No edema. Pedal pulses 2+.  Skin: Warm and dry. No rashes, lesions or wounds Neuro: Alert and oriented times 3; grossly nonfocal.  Psych: Insight is good and judgment is appropriate. Mood and affect normal.   Data  Review:    Labs: Basic Metabolic Panel: Recent Labs  Lab 11/17/22 1438  NA 137  K 3.6  CL 104  CO2 22  GLUCOSE 119*  BUN 16  CREATININE 1.11  CALCIUM 8.4*  MG 2.0  PHOS 1.6*   Liver Function Tests: Recent Labs  Lab 11/17/22 1549  AST 18  ALT 14  ALKPHOS 53  BILITOT 1.4*  PROT 7.5  ALBUMIN 4.1   No results for input(s): "LIPASE", "AMYLASE" in the last 168 hours. No results for input(s): "AMMONIA" in the last 168 hours. CBC: Recent Labs  Lab 11/17/22 1438  WBC 13.6*  HGB 14.8  HCT 46.9  MCV 86.2  PLT 108*   Cardiac Enzymes: No results for input(s): "CKTOTAL", "CKMB", "CKMBINDEX", "TROPONINI" in the last 168 hours.  BNP (last 3 results) No results for input(s): "PROBNP" in the last 8760 hours. CBG: No results for input(s): "GLUCAP" in the last 168 hours.  Urinalysis    Component Value Date/Time   COLORURINE YELLOW (A) 11/17/2022 1526   APPEARANCEUR HAZY (A) 11/17/2022 1526   LABSPEC 1.015 11/17/2022 1526   PHURINE 5.0 11/17/2022 1526   GLUCOSEU NEGATIVE 11/17/2022 1526   HGBUR SMALL (A) 11/17/2022 1526   BILIRUBINUR NEGATIVE 11/17/2022 1526   KETONESUR 5 (A) 11/17/2022 1526   PROTEINUR 30 (A) 11/17/2022 1526    NITRITE NEGATIVE 11/17/2022 1526   LEUKOCYTESUR NEGATIVE 11/17/2022 1526      Radiographic Studies: CT HEAD WO CONTRAST  Result Date: 11/17/2022 CLINICAL DATA:  Head trauma EXAM: CT HEAD WITHOUT CONTRAST TECHNIQUE: Contiguous axial images were obtained from the base of the skull through the vertex without intravenous contrast. RADIATION DOSE REDUCTION: This exam was performed according to the departmental dose-optimization program which includes automated exposure control, adjustment of the mA and/or kV according to patient size and/or use of iterative reconstruction technique. COMPARISON:  Brain MRI 10/03/2021 FINDINGS: Brain: There is no acute intracranial hemorrhage, extra-axial fluid collection, or acute infarct There is background parenchymal volume loss with prominence of the ventricular system and extra-axial CSF spaces, unchanged. The ventricles are stable in size compared to the prior brain MRI. Patchy and confluent hypodensity in the supratentorial white matter likely reflects sequela of chronic small-vessel ischemic change, stable. The pituitary and suprasellar region are normal. There is no mass lesion. There is no mass effect or midline shift. Vascular: No hyperdense vessel or unexpected calcification. Skull: Normal. Negative for fracture or focal lesion. Sinuses/Orbits: There is extensive mucosal thickening in the paranasal sinuses with layering fluid in the bilateral maxillary and left sphenoid sinuses. There is surrounding hyperostosis consistent with chronic sinusitis. Bilateral lens implants are in place. The globes and orbits are otherwise unremarkable. Other: None. IMPRESSION: 1. No acute intracranial pathology. 2. Extensive mucosal thickening in the paranasal sinuses with layering fluid which could reflect acute sinusitis in the correct clinical setting. Electronically Signed   By: Lesia Hausen M.D.   On: 11/17/2022 16:31   DG Chest Port 1 View  Result Date: 11/17/2022 CLINICAL DATA:   Sepsis. EXAM: PORTABLE CHEST 1 VIEW COMPARISON:  July 05, 2019. FINDINGS: Stable cardiomegaly. Both lungs are clear. The visualized skeletal structures are unremarkable. IMPRESSION: No active disease. Electronically Signed   By: Lupita Raider M.D.   On: 11/17/2022 16:17    EKG: Independently reviewed by me shows atrial fibrillation with RVR.    Assessment/Plan:   Principal Problem:   Sepsis (HCC) Active Problems:   Thrombocytopenia (HCC)   Atrial fibrillation with RVR (HCC)  Acute sinusitis   General weakness   Suspected sepsis from acute sinusitis: Patient has leukocytosis with WBC of 13.6, tachypnea (RR 31), tachycardia, (heart rate in the 120s).  CT head showed extensive mucosal thickening in the paranasal sinuses concerning for acute sinusitis.  Initial lactic acid was normal.  Repeat lactic acid.  Respiratory viral panel and COVID-19 test have been ordered. He was given IV ceftriaxone and azithromycin in the ED. continue IV ceftriaxone azithromycin for now.  Add Robitussin as needed for cough and Flonase nasal spray Chest x-ray was negative but pneumonia is a differential diagnosis.  Obtain CT chest without contrast for further evaluation.   Permanent atrial fibrillation with RVR: Heart rate improved after IV fluids and metoprolol in the ED.  Continue home dose of metoprolol and warfarin.  INR is therapeutic at 2.0.   General weakness, fatigue, lack of energy: Likely from acute sinusitis.  PT evaluation.   Parkinson's disease: Continue levodopa-carbidopa   Hypertension: Continue Lasix and metoprolol   Chronic thrombocytopenia: Platelet count is stable    Other information:   DVT prophylaxis:  warfarin (COUMADIN) tablet 5 mg   Code Status: DNR.  This was confirmed by patient and his wife at the bedside Family Communication: Plan discussed with his wife at the bedside Disposition Plan: Plan to discharge home in 1 to 2 days Consults called: None Admission  status: Observation  The medical decision making on this patient was of high complexity and the patient is at high risk for clinical deterioration, therefore this is a level 3 visit.    Neil Bailey [facility] Pager: Please check [email or web address]   How to contact the [facility] Attending or Consulting provider 7A - 7P or covering provider during after hours 7P -7A, for this patient?   Check the care team in [facility] and look for a) attending/consulting [facility] provider listed and b) the [facility] team listed Log into [email or web address] and use [place]'s universal password to access. If you do not have the password, please contact the hospital operator. Locate the [facility] provider you are looking for under [facility] and page to a number that you can be directly reached. If you still have difficulty reaching the provider, please page the [facility] (Director on Call) for the Hospitalists listed on amion for assistance.  11/17/2022, 6:28 PM

## 2022-11-17 NOTE — ED Triage Notes (Signed)
First nurse note:Arrived by EMS from home with increased weakness X2 days. Baseline can walk with no assistance. Today unable to stand for long period of time or pivot.   125CBG 122/78 b/p 18G R forearm - EMS administered 500cc fluid

## 2022-11-17 NOTE — ED Triage Notes (Signed)
Patient to ED via [facility] from home for weakness x2 days. Patient sure of place or time but alert to self. Patient states he is here for cough because his wife made him come.

## 2022-11-17 NOTE — ED Notes (Signed)
Pt urinated in bed, pt here for fall and sepsis work up being done at this time. For pt safety male purewick applied.

## 2022-11-17 NOTE — ED Provider Notes (Signed)
[facility] Provider Note    Event Date/Time   First MD Initiated Contact with Patient 11/17/22 1518     (approximate)   History   Weakness   HPI  Neil Bailey is a 72 y.o. male   Past medical history of atrial fibrillation on Coumadin, aortic atherosclerosis, DVT, Parkinson's, who is here with several weeks of deep productive chest cough, nasal congestion, generalized weakness.  Had appointment with his PMD today for the symptoms but in getting ready for the appointment he felt very weak and laid himself down in his bedroom to rest, which was unusual, and concerned his wife who brought him to the emergency department for evaluation.  He states that he did not fall or lose consciousness when he laid himself down to rest in the bedroom floor.  He denies any pain.  He is unsure if he has had a fever.  He denies any GI or GU complaints.  He denies any motor or sensory changes.  He has been fully compliant with his Coumadin but missed his dose this morning only.  Independent Historian contributed to assessment above: The patient's son and wife are here to corroborate information given above       Physical Exam   Triage Vital Signs: ED Triage Vitals  Enc Vitals Group     BP 11/17/22 1434 113/82     Pulse Rate 11/17/22 1434 99     Resp 11/17/22 1434 18     Temp 11/17/22 1434 100 F (37.8 C)     Temp Source 11/17/22 1434 Oral     SpO2 11/17/22 1434 96 %     Weight --      Height --      Head Circumference --      Peak Flow --      Pain Score 11/17/22 1436 0     Pain Loc --      Pain Edu? --      Excl. in GC? --     Most recent vital signs: Vitals:   11/17/22 1600 11/17/22 1700  BP: 109/77 105/66  Pulse: (!) 117 (!) 118  Resp: (!) 31   Temp:    SpO2: 97% 97%    General: Awake, no distress.  tachypneic.  Blood pressure is normotensive ake alert and oriented.  Comfortable appearing.  Tachycardic.  Lung sounds without obvious focalities  or rales or wheezing.  Abdomen is soft and nontender. CV:  Good peripheral perfusion.  Resp:  Normal effort.  Abd:  No distention.  Other:  Is no motor or sensory deficits, no facial asymmetry normal finger-nose.  No dysarthria.   ED Results / Procedures / Treatments   Labs (all labs ordered are listed, but only abnormal results are displayed) Labs Reviewed  BASIC METABOLIC PANEL - Abnormal; Notable for the following components:      Result Value   Glucose, Bld 119 (*)    Calcium 8.4 (*)    All other components within normal limits  CBC - Abnormal; Notable for the following components:   WBC 13.6 (*)    Platelets 108 (*)    All other components within normal limits  URINALYSIS, ROUTINE W REFLEX MICROSCOPIC - Abnormal; Notable for the following components:   Color, Urine YELLOW (*)    APPearance HAZY (*)    Hgb urine dipstick SMALL (*)    Ketones, ur 5 (*)    Protein, ur 30 (*)    Bacteria, UA MANY (*)  All other components within normal limits  HEPATIC FUNCTION PANEL - Abnormal; Notable for the following components:   Total Bilirubin 1.4 (*)    Bilirubin, Direct 0.3 (*)    Indirect Bilirubin 1.1 (*)    All other components within normal limits  PROTIME-INR - Abnormal; Notable for the following components:   Prothrombin Time 23.3 (*)    INR 2.0 (*)    All other components within normal limits  CULTURE, BLOOD (ROUTINE X 2)  CULTURE, BLOOD (ROUTINE X 2)  RESPIRATORY PANEL BY PCR  LACTIC ACID, PLASMA  LACTIC ACID, PLASMA  URINALYSIS, COMPLETE (UACMP) WITH MICROSCOPIC  CBG MONITORING, ED  TROPONIN I (HIGH SENSITIVITY)  TROPONIN I (HIGH SENSITIVITY)     I ordered and reviewed the above labs they are notable for his white blood cell count is elevated and he has bacteriuria  EKG  ED ECG REPORT I, Pilar Jarvis, the attending physician, personally viewed and interpreted this ECG.   Date: 11/17/2022  EKG Time: 1436  Rate: 123  Rhythm: AF RVR  Axis: nl   Intervals:none  ST&T Change: no stemi  RADIOLOGY I independently reviewed and interpreted chest x-ray see no obvious focality or pneumothorax   PROCEDURES:  Critical Care performed: Yes, see critical care procedure note(s)  .Critical Care  Performed by: Pilar Jarvis, MD Authorized by: Pilar Jarvis, MD   Critical care provider statement:    Critical care time (minutes):  30   Critical care was time spent personally by me on the following activities:  Development of treatment plan with patient or surrogate, discussions with consultants, evaluation of patient's response to treatment, examination of patient, ordering and review of laboratory studies, ordering and review of radiographic studies, ordering and performing treatments and interventions, pulse oximetry, re-evaluation of patient's condition and review of old charts    MEDICATIONS ORDERED IN ED: Medications  cefTRIAXone (ROCEPHIN) 1 g in sodium chloride 0.9 % 100 mL IVPB (has no administration in time range)  azithromycin (ZITHROMAX) 500 mg in sodium chloride 0.9 % 250 mL IVPB (has no administration in time range)  acetaminophen (TYLENOL) tablet 650 mg (has no administration in time range)  warfarin (COUMADIN) tablet 5 mg (has no administration in time range)  metoprolol tartrate (LOPRESSOR) tablet 25 mg (has no administration in time range)  sodium chloride 0.9 % bolus 1,000 mL (1,000 mLs Intravenous Bolus 11/17/22 1556)    External physician / consultants:  I spoke with hospitalist for admission and regarding care plan for this patient.   IMPRESSION / MDM / ASSESSMENT AND PLAN / ED COURSE  I reviewed the triage vital signs and the nursing notes.                                Patient's presentation is most consistent with acute presentation with potential threat to life or bodily function.  Differential diagnosis includes, but is not limited to, sepsis, respiratory infection, bacterial pneumonia or viral URI, urinary  tract infection, sinusitis, stroke, intracranial bleeding from fall, acs   The patient is on the cardiac monitor to evaluate for evidence of arrhythmia and/or significant heart rate changes.  MDM:    Concern for sepsis given tachycardia, fever, tachypnea, respiratory infectious symptoms.  Sepsis labs fluid bolus ordered.  Tylenol ordered.  I ordered ceftriaxone and azithromycin to cover for most likely source given his productive cough and sinus pressure congestion.  Doubt stroke given no focal neurologic deficits.  Outside of time window as well.  CT head for ?FALL, though the patient states that he sustained no injury and laid himself down due to his tiredness. --- Workup thus far reveals signs of sinusitis on his CT scan, no obvious focal consolidations on his chest x-ray so I added on the respiratory viral panel for his productive cough, and has bacteriuria without inflammatory changes in his urine.  I think that the ceftriaxone and azithromycin should cover for all these potential etiologies of his sepsis.  Admission. ----  Sepsis reevaluation.  His heart rate has improved to the low 100s atrial fibrillation anywhere from 100-1 05.  He remains normotensive.  He did not take his warfarin and his INR today is 2.0.  I will give him his home dose of 5 mg.  He did not take his metoprolol 25 mg as well so I will give him his home dose and continue to monitor for rate control.  I do not think he needs IV rate control at this time.  He has gotten 1 L of crystalloid and his heart rate is improved slightly, I will defer given he looks euvolemic and states that he has a history of heart failure and is on Lasix at home so normal to fluid overload him.  I considered PE but since the patient is anticoagulated I think this is less likely especially in the setting of his sinusitis, cough, fever I think is most likely upper respiratory infection/sinusitis.      FINAL CLINICAL IMPRESSION(S) / ED DIAGNOSES    Final diagnoses:  Generalized weakness  Bacteriuria  Acute sinusitis, recurrence not specified, unspecified location  Productive cough  Sepsis, due to unspecified organism, unspecified whether acute organ dysfunction present [facility])     Rx / DC Orders   ED Discharge Orders     None        Note:  This document was prepared using Dragon voice recognition software and may include unintentional dictation errors.    Pilar Jarvis, MD 11/17/22 [phone]

## 2022-11-17 NOTE — ED Notes (Signed)
Pt presents to ED with c/o of having issues with remembering why he came. Per family pt was upstairs at some point today/this morning and ended up laying on the floor but does not remember why he did that but denies falling. Pt does take coumadin for a-fib.   Per triage RN and first nurse pt was confused in triage and giving the wrong date. When this RN assessed pt his GCS was 15 and pt was able give this RN correct name, birthday, current year and current president.   Pt presents with tachypnea and tachycardia in triage and does have a foul urine odor to him but pt denies urinating on himself. Pt denies fever or chills but does endorse a productive cough. Pt denies any unilateral weakness or weakness at all at this time.   Pt can normally ambulate without assistance but just moving pt from wheelchair to bed he was a 2 assist and was shuffling feet instead of picking them up and walking and son states this is not his normal ambulation pattern.

## 2022-11-17 NOTE — Consult Note (Signed)
ANTICOAGULATION CONSULT NOTE - Initial Consult  Pharmacy Consult for warfarin Indication: atrial fibrillation  No Known Allergies  Patient Measurements:   Heparin Dosing Weight:   Vital Signs: Temp: 100 F (37.8 C) (05/09 1434) Temp Source: Oral (05/09 1434) BP: 98/62 (05/09 1800) Pulse Rate: 105 (05/09 1800)  Labs: Recent Labs    11/17/22 1438 11/17/22 1549  HGB 14.8  --   HCT 46.9  --   PLT 108*  --   LABPROT  --  23.3*  INR  --  2.0*  CREATININE 1.11  --   TROPONINIHS  --  12    CrCl cannot be calculated (Unknown ideal weight.).   Medical History: Past Medical History:  Diagnosis Date   Aortic atherosclerosis (HCC) 03/09/2020   Noted on abdominal CT   Atrial fibrillation (HCC)    COVID-19 06/2019   DVT (deep venous thrombosis) (HCC)    Dyspnea    since covid dec 2020   Dysrhythmia    paroxysmal atrial fibrillation   HTN (hypertension)    Lab test positive for detection of COVID-19 virus 06/30/2019   Morbid obesity (HCC)    Parkinson disease    Pedal edema    Renal cyst, acquired, right    Thrombocytopenia (HCC)     Medications:  PTA warfarin regimen: 5 mg Friday, Sat, Sun; 7.5 mg all other days TWD= 45 mg   Assessment: 72 y.o. male with medical history significant for remote history of right lower extremity DVT, HTN. atrial fibrillation on warfarin regimen outlined above, presents to ED with generalized weakness, productive cough, congestion. INR 2.0 (therapeutic) on admission. Pharmacy has been consulted to manage warfarin therapy while patient admitted   DDI:  azithromycin 500 mg x1 5/9, ceftriaxone 5/9  Date INR Warfarin Dose 5/7 -- 7.5 mg (PTA) 5/8 -- 7.5 mg  (PTA) 5/9 2.0 7.5 mg   Goal of Therapy:  INR 2-3 Monitor platelets by anticoagulation protocol: Yes   Plan:  Warfarin therapeutic (2.0) on admission Continue with home regimen of 7.5 mg tonight INR with AM labs  Sharen Hones, PharmD, BCPS Clinical Pharmacist    11/17/2022,6:53 PM

## 2022-11-18 DIAGNOSIS — Z66 Do not resuscitate: Secondary | ICD-10-CM | POA: Diagnosis present

## 2022-11-18 DIAGNOSIS — J0140 Acute pansinusitis, unspecified: Secondary | ICD-10-CM | POA: Diagnosis not present

## 2022-11-18 DIAGNOSIS — A419 Sepsis, unspecified organism: Secondary | ICD-10-CM | POA: Diagnosis present

## 2022-11-18 DIAGNOSIS — J019 Acute sinusitis, unspecified: Secondary | ICD-10-CM | POA: Diagnosis present

## 2022-11-18 DIAGNOSIS — D696 Thrombocytopenia, unspecified: Secondary | ICD-10-CM | POA: Diagnosis present

## 2022-11-18 DIAGNOSIS — Z79899 Other long term (current) drug therapy: Secondary | ICD-10-CM | POA: Diagnosis not present

## 2022-11-18 DIAGNOSIS — Z1152 Encounter for screening for COVID-19: Secondary | ICD-10-CM | POA: Diagnosis not present

## 2022-11-18 DIAGNOSIS — Z87891 Personal history of nicotine dependence: Secondary | ICD-10-CM | POA: Diagnosis not present

## 2022-11-18 DIAGNOSIS — Z8616 Personal history of COVID-19: Secondary | ICD-10-CM | POA: Diagnosis not present

## 2022-11-18 DIAGNOSIS — G20A1 Parkinson's disease without dyskinesia, without mention of fluctuations: Secondary | ICD-10-CM | POA: Diagnosis present

## 2022-11-18 DIAGNOSIS — I4891 Unspecified atrial fibrillation: Secondary | ICD-10-CM | POA: Diagnosis not present

## 2022-11-18 DIAGNOSIS — B348 Other viral infections of unspecified site: Secondary | ICD-10-CM | POA: Diagnosis present

## 2022-11-18 DIAGNOSIS — I4821 Permanent atrial fibrillation: Secondary | ICD-10-CM | POA: Diagnosis present

## 2022-11-18 DIAGNOSIS — I110 Hypertensive heart disease with heart failure: Secondary | ICD-10-CM | POA: Diagnosis present

## 2022-11-18 DIAGNOSIS — Z7901 Long term (current) use of anticoagulants: Secondary | ICD-10-CM | POA: Diagnosis not present

## 2022-11-18 DIAGNOSIS — I509 Heart failure, unspecified: Secondary | ICD-10-CM | POA: Diagnosis present

## 2022-11-18 DIAGNOSIS — J189 Pneumonia, unspecified organism: Secondary | ICD-10-CM

## 2022-11-18 DIAGNOSIS — Z86718 Personal history of other venous thrombosis and embolism: Secondary | ICD-10-CM | POA: Diagnosis not present

## 2022-11-18 LAB — RESPIRATORY PANEL BY PCR

## 2022-11-18 LAB — CBC WITH DIFFERENTIAL/PLATELET
Abs Immature Granulocytes: 0.13 10*3/uL — ABNORMAL HIGH (ref 0.00–0.07)
Basophils Absolute: 0 10*3/uL (ref 0.0–0.1)
Basophils Relative: 0 %
Eosinophils Absolute: 0 10*3/uL (ref 0.0–0.5)
Eosinophils Relative: 0 %
HCT: 42.9 % (ref 39.0–52.0)
Hemoglobin: 13.5 g/dL (ref 13.0–17.0)
Immature Granulocytes: 1 %
Lymphocytes Relative: 9 %
Lymphs Abs: 0.8 10*3/uL (ref 0.7–4.0)
MCH: 27.5 pg (ref 26.0–34.0)
MCHC: 31.5 g/dL (ref 30.0–36.0)
MCV: 87.4 fL (ref 80.0–100.0)
Monocytes Absolute: 1.5 10*3/uL — ABNORMAL HIGH (ref 0.1–1.0)
Monocytes Relative: 16 %
Neutro Abs: 6.8 10*3/uL (ref 1.7–7.7)
Neutrophils Relative %: 74 %
Platelets: 103 10*3/uL — ABNORMAL LOW (ref 150–400)
RBC: 4.91 MIL/uL (ref 4.22–5.81)
RDW: 14.8 % (ref 11.5–15.5)
Smear Review: ADEQUATE
WBC: 9.3 10*3/uL (ref 4.0–10.5)
nRBC: 0 % (ref 0.0–0.2)

## 2022-11-18 LAB — PROTIME-INR
INR: 2.4 — ABNORMAL HIGH (ref 0.8–1.2)
Prothrombin Time: 26.3 seconds — ABNORMAL HIGH (ref 11.4–15.2)

## 2022-11-18 LAB — BASIC METABOLIC PANEL
Anion gap: 8 (ref 5–15)
BUN: 16 mg/dL (ref 8–23)
CO2: 23 mmol/L (ref 22–32)
Calcium: 7.8 mg/dL — ABNORMAL LOW (ref 8.9–10.3)
Chloride: 106 mmol/L (ref 98–111)
Creatinine, Ser: 1.1 mg/dL (ref 0.61–1.24)
GFR, Estimated: >60 mL/min (ref >60)
Glucose, Bld: 102 mg/dL — ABNORMAL HIGH (ref 70–99)
Potassium: 3.7 mmol/L (ref 3.5–5.1)
Sodium: 137 mmol/L (ref 135–145)

## 2022-11-18 LAB — CULTURE, BLOOD (ROUTINE X 2): Culture: NO GROWTH

## 2022-11-18 NOTE — Progress Notes (Addendum)
Progress Note    Neil Bailey  ZOX:[id number] DOB: 1951-01-24  DOA: 11/17/2022 PCP: Danella Penton, MD      Brief Narrative:    Medical records reviewed and are as summarized below:  Neil Bailey is a 72 y.o. male  with medical history significant for atrial fibrillation on warfarin, remote history of right lower extremity DVT, hypertension, morbid obesity, Parkinson's disease, thrombocytopenia, aortic atherosclerosis, history of COVID-19 infection, who presented to the hospital with multiple complaints.  His complaints include shortness of breath, general weakness, fatigue, lack of energy, unsteady gait, cough productive of greenish sputum, congestion, runny nose and sneezing.  Symptoms this started about 3 days prior to admission and have progressively worsened.  On the day of admission, he was so weak that he laid down on the bedroom floor.  His wife found him on the floor and insisted that he come to the emergency department for further management.     He was found to have sepsis secondary to community-acquired pneumonia and  acute sinusitis.      Assessment/Plan:   Principal Problem:   Sepsis (HCC) Active Problems:   CAP (community acquired pneumonia)   Thrombocytopenia (HCC)   Atrial fibrillation with RVR (HCC)   Acute sinusitis   General weakness    Sepsis secondary to community-acquired pneumonia and acute sinusitis: Continue IV ceftriaxone and azithromycin.  Follow-up blood cultures. Respiratory panel by PCR showed parainfluenza virus 3. CT chest showed left lower lobe pneumonia.   Permanent atrial fibrillation with RVR: Fluctuating heart rate with heart rate mostly below 110.  Continue metoprolol and warfarin.  INR is therapeutic at 2.4.     General weakness, fatigue, lack of energy: Improved.  This was likely from sepsis.  PT recommended home health therapy but he declined home health at this time.    Parkinson's disease: Continue  levodopa-carbidopa     Hypertension: Continue Lasix and metoprolol     Chronic thrombocytopenia: Platelet count is stable    Diet Order             Diet Heart Room service appropriate? Yes; Fluid consistency: Thin  Diet effective now                            Consultants: None  Procedures: None    Medications:    carbidopa-levodopa  1.5 tablet Oral BID   fluticasone  1 spray Each Nare Daily   furosemide  20 mg Oral Daily   metoprolol succinate  12.5 mg Oral Daily   pantoprazole  40 mg Oral Daily   potassium chloride SA  10 mEq Oral Daily   warfarin  5 mg Oral Once per day on Sun Fri Sat   [START ON 11/21/2022] warfarin  7.5 mg Oral Once per day on Mon Tue Wed Thu   Warfarin - Pharmacist Dosing Inpatient   Does not apply q1600   Continuous Infusions:  azithromycin     cefTRIAXone (ROCEPHIN)  IV Stopped (11/18/22 1217)     Anti-infectives (From admission, onward)    Start     Dose/Rate Route Frequency Ordered Stop   11/18/22 1700  azithromycin (ZITHROMAX) 500 mg in sodium chloride 0.9 % 250 mL IVPB        500 mg 250 mL/hr over 60 Minutes Intravenous Every 24 hours 11/17/22 1940 11/21/22 1659   11/18/22 1000  cefTRIAXone (ROCEPHIN) 1 g in sodium chloride 0.9 % 100 mL IVPB  1 g 200 mL/hr over 30 Minutes Intravenous Every 24 hours 11/17/22 1940 11/21/22 0959   11/17/22 1645  cefTRIAXone (ROCEPHIN) 1 g in sodium chloride 0.9 % 100 mL IVPB        1 g 200 mL/hr over 30 Minutes Intravenous  Once 11/17/22 1639 11/17/22 1835   11/17/22 1645  azithromycin (ZITHROMAX) 500 mg in sodium chloride 0.9 % 250 mL IVPB        500 mg 250 mL/hr over 60 Minutes Intravenous  Once 11/17/22 1639 11/17/22 1915              Family Communication/Anticipated D/C date and plan/Code Status   DVT prophylaxis:  warfarin (COUMADIN) tablet 5 mg  warfarin (COUMADIN) tablet 7.5 mg     Code Status: DNR  Family Communication: None Disposition Plan: Plan to  discharge home in 1 to 2 days   Status is: Inpatient Remains inpatient appropriate because: Sepsis from pneumonia on IV antibiotics       Subjective:   Interval events noted.  He complains of cough, runny nose and general weakness.  He feels a little better today.  No shortness of breath or chest pain.  Objective:    Vitals:   11/18/22 0700 11/18/22 0800 11/18/22 1015 11/18/22 1400  BP: 126/63 127/76 108/63 113/62  Pulse: 95 (!) 107 100 84  Resp: (!) 28 (!) 22 (!) 30 18  Temp:  98.6 F (37 C)  97.9 F (36.6 C)  TempSrc:  Oral  Oral  SpO2: 99% 96% 94% 95%   No data found.  No intake or output data in the 24 hours ending 11/18/22 1458 There were no vitals filed for this visit.  Exam:  GEN: NAD SKIN: Warm and dry EYES: No pallor or icterus ENT: MMM CV: Irregular rate and rhythm, tachycardic PULM: Left basilar rales, no wheezing ABD: soft, obese, NT, +BS CNS: AAO x 3, non focal EXT: No edema or tenderness        Data Reviewed:   I have personally reviewed following labs and imaging studies:  Labs: Labs show the following:   Basic Metabolic Panel: Recent Labs  Lab 11/17/22 1438 11/18/22 0526  NA 137 137  K 3.6 3.7  CL 104 106  CO2 22 23  GLUCOSE 119* 102*  BUN 16 16  CREATININE 1.11 1.10  CALCIUM 8.4* 7.8*  MG 2.0  --   PHOS 1.6*  --    GFR CrCl cannot be calculated (Unknown ideal weight.). Liver Function Tests: Recent Labs  Lab 11/17/22 1549  AST 18  ALT 14  ALKPHOS 53  BILITOT 1.4*  PROT 7.5  ALBUMIN 4.1   No results for input(s): "LIPASE", "AMYLASE" in the last 168 hours. No results for input(s): "AMMONIA" in the last 168 hours. Coagulation profile Recent Labs  Lab 11/17/22 1549 11/18/22 0526  INR 2.0* 2.4*    CBC: Recent Labs  Lab 11/17/22 1438 11/18/22 0526  WBC 13.6* 9.3  NEUTROABS  --  6.8  HGB 14.8 13.5  HCT 46.9 42.9  MCV 86.2 87.4  PLT 108* 103*   Cardiac Enzymes: No results for input(s): "CKTOTAL",  "CKMB", "CKMBINDEX", "TROPONINI" in the last 168 hours. BNP (last 3 results) No results for input(s): "PROBNP" in the last 8760 hours. CBG: No results for input(s): "GLUCAP" in the last 168 hours. D-Dimer: No results for input(s): "DDIMER" in the last 72 hours. Hgb A1c: No results for input(s): "HGBA1C" in the last 72 hours. Lipid Profile: No results for  input(s): "CHOL", "HDL", "LDLCALC", "TRIG", "CHOLHDL", "LDLDIRECT" in the last 72 hours. Thyroid function studies: No results for input(s): "TSH", "T4TOTAL", "T3FREE", "THYROIDAB" in the last 72 hours.  Invalid input(s): "FREET3" Anemia work up: No results for input(s): "VITAMINB12", "FOLATE", "FERRITIN", "TIBC", "IRON", "RETICCTPCT" in the last 72 hours. Sepsis Labs: Recent Labs  Lab 11/17/22 1438 11/17/22 1549 11/17/22 1838 11/18/22 0526  PROCALCITON  --  0.20  --   --   WBC 13.6*  --   --  9.3  LATICACIDVEN  --  1.5 1.3  --     Microbiology Recent Results (from the past 240 hour(s))  Blood Culture (routine x 2)     Status: None (Preliminary result)   Collection Time: 11/17/22  3:32 PM   Specimen: BLOOD  Result Value Ref Range Status   Specimen Description BLOOD BLOOD LEFT ARM  Final   Special Requests   Final    BOTTLES DRAWN AEROBIC AND ANAEROBIC Blood Culture results may not be optimal due to an inadequate volume of blood received in culture bottles   Culture   Final    NO GROWTH < 12 HOURS Performed at [facility], [address] [place], [place] [address]    Report Status PENDING  Incomplete  Blood Culture (routine x 2)     Status: None (Preliminary result)   Collection Time: 11/17/22  3:37 PM   Specimen: BLOOD  Result Value Ref Range Status   Specimen Description BLOOD RIGHT ANTECUBITAL  Final   Special Requests   Final    BOTTLES DRAWN AEROBIC AND ANAEROBIC Blood Culture adequate volume   Culture   Final    NO GROWTH < 12 HOURS Performed at [facility], [address]  [place], [place] [address]    Report Status PENDING  Incomplete  Respiratory (~20 pathogens) panel by PCR     Status: Abnormal   Collection Time: 11/17/22  6:38 PM   Specimen: Nasopharyngeal Swab; Respiratory  Result Value Ref Range Status   Adenovirus NOT DETECTED NOT DETECTED Final   Coronavirus 229E NOT DETECTED NOT DETECTED Final    Comment: (NOTE) The Coronavirus on the Respiratory Panel, DOES NOT test for the novel  Coronavirus (2019 nCoV)    Coronavirus HKU1 NOT DETECTED NOT DETECTED Final   Coronavirus NL63 NOT DETECTED NOT DETECTED Final   Coronavirus OC43 NOT DETECTED NOT DETECTED Final   Metapneumovirus NOT DETECTED NOT DETECTED Final   Rhinovirus / Enterovirus NOT DETECTED NOT DETECTED Final   Influenza A NOT DETECTED NOT DETECTED Final   Influenza B NOT DETECTED NOT DETECTED Final   Parainfluenza Virus 1 NOT DETECTED NOT DETECTED Final   Parainfluenza Virus 2 NOT DETECTED NOT DETECTED Final   Parainfluenza Virus 3 DETECTED (A) NOT DETECTED Final   Parainfluenza Virus 4 NOT DETECTED NOT DETECTED Final   Respiratory Syncytial Virus NOT DETECTED NOT DETECTED Final   Bordetella pertussis NOT DETECTED NOT DETECTED Final   Bordetella Parapertussis NOT DETECTED NOT DETECTED Final   Chlamydophila pneumoniae NOT DETECTED NOT DETECTED Final   Mycoplasma pneumoniae NOT DETECTED NOT DETECTED Final    Comment: Performed at [facility], [address], [place], [place] [address]  SARS Coronavirus 2 by RT PCR (hospital order, performed in [facility] hospital lab) *cepheid single result test* Nasopharyngeal Swab     Status: None   Collection Time: 11/17/22  6:38 PM   Specimen: Nasopharyngeal Swab; Nasal Swab  Result Value Ref Range Status   SARS  Coronavirus 2 by RT PCR NEGATIVE NEGATIVE Final    Comment: (NOTE) SARS-CoV-2 target nucleic acids are NOT DETECTED.  The SARS-CoV-2 RNA is generally detectable in upper and lower respiratory specimens during the acute phase of  infection. The lowest concentration of SARS-CoV-2 viral copies this assay can detect is 250 copies / mL. A negative result does not preclude SARS-CoV-2 infection and should not be used as the sole basis for treatment or other patient management decisions.  A negative result may occur with improper specimen collection / handling, submission of specimen other than nasopharyngeal swab, presence of viral mutation(s) within the areas targeted by this assay, and inadequate number of viral copies (<250 copies / mL). A negative result must be combined with clinical observations, patient history, and epidemiological information.  Fact Sheet for Patients:   RoadLapTop.co.za  Fact Sheet for Healthcare Providers: http://kim-miller.com/  This test is not yet approved or  cleared by the [place] FDA and has been authorized for detection and/or diagnosis of SARS-CoV-2 by FDA under an Emergency Use Authorization (EUA).  This EUA will remain in effect (meaning this test can be used) for the duration of the COVID-19 declaration under Section 564(b)(1) of the Act, 21 U.S.C. section 360bbb-3(b)(1), unless the authorization is terminated or revoked sooner.  Performed at [facility], [address] [place], [place] [address]     Procedures and diagnostic studies:  CT CHEST WO CONTRAST  Result Date: 11/17/2022 CLINICAL DATA:  Weakness EXAM: CT CHEST WITHOUT CONTRAST TECHNIQUE: Multidetector CT imaging of the chest was performed following the standard protocol without IV contrast. RADIATION DOSE REDUCTION: This exam was performed according to the departmental dose-optimization program which includes automated exposure control, adjustment of the mA and/or kV according to patient size and/or use of iterative reconstruction technique. COMPARISON:  Chest x-ray 11/17/2022 FINDINGS: Cardiovascular: Limited evaluation without intravenous contrast. Mild  atherosclerosis. No aneurysm. Cardiomegaly. No pericardial effusion. Mediastinum/Nodes: Midline trachea. No thyroid mass. Subcentimeter mediastinal lymph nodes. Esophagus within normal limits. Lungs/Pleura: Mild apical emphysema. Consolidation and ground-glass density in the left lower lobe. No pleural effusion or pneumothorax. Upper Abdomen: No acute finding Musculoskeletal: No acute or suspicious osseous abnormality IMPRESSION: 1. Consolidation and ground-glass density in the left lower lobe consistent with pneumonia. Imaging follow-up to resolution is recommended. 2. Cardiomegaly. Aortic Atherosclerosis (ICD10-I70.0) and Emphysema (ICD10-J43.9). Electronically Signed   By: Jasmine Pang M.D.   On: 11/17/2022 18:43   CT HEAD WO CONTRAST  Result Date: 11/17/2022 CLINICAL DATA:  Head trauma EXAM: CT HEAD WITHOUT CONTRAST TECHNIQUE: Contiguous axial images were obtained from the base of the skull through the vertex without intravenous contrast. RADIATION DOSE REDUCTION: This exam was performed according to the departmental dose-optimization program which includes automated exposure control, adjustment of the mA and/or kV according to patient size and/or use of iterative reconstruction technique. COMPARISON:  Brain MRI 10/03/2021 FINDINGS: Brain: There is no acute intracranial hemorrhage, extra-axial fluid collection, or acute infarct There is background parenchymal volume loss with prominence of the ventricular system and extra-axial CSF spaces, unchanged. The ventricles are stable in size compared to the prior brain MRI. Patchy and confluent hypodensity in the supratentorial white matter likely reflects sequela of chronic small-vessel ischemic change, stable. The pituitary and suprasellar region are normal. There is no mass lesion. There is no mass effect or midline shift. Vascular: No hyperdense vessel or unexpected calcification. Skull: Normal. Negative for fracture or focal lesion. Sinuses/Orbits: There is  extensive mucosal thickening in the paranasal sinuses  with layering fluid in the bilateral maxillary and left sphenoid sinuses. There is surrounding hyperostosis consistent with chronic sinusitis. Bilateral lens implants are in place. The globes and orbits are otherwise unremarkable. Other: None. IMPRESSION: 1. No acute intracranial pathology. 2. Extensive mucosal thickening in the paranasal sinuses with layering fluid which could reflect acute sinusitis in the correct clinical setting. Electronically Signed   By: Lesia Hausen M.D.   On: 11/17/2022 16:31   DG Chest Port 1 View  Result Date: 11/17/2022 CLINICAL DATA:  Sepsis. EXAM: PORTABLE CHEST 1 VIEW COMPARISON:  July 05, 2019. FINDINGS: Stable cardiomegaly. Both lungs are clear. The visualized skeletal structures are unremarkable. IMPRESSION: No active disease. Electronically Signed   By: Lupita Raider M.D.   On: 11/17/2022 16:17               LOS: 0 days   Nevayah Faust  [facility]   Pager on [email or web address]. If 7PM-7AM, please contact night-coverage at [email or web address]     11/18/2022, 2:58 PM

## 2022-11-18 NOTE — Consult Note (Signed)
ANTICOAGULATION CONSULT NOTE  Pharmacy Consult for warfarin Indication: atrial fibrillation  No Known Allergies  Patient Measurements:     Vital Signs: Temp: 98.6 F (37 C) (05/10 0800) Temp Source: Oral (05/10 0800) BP: 127/76 (05/10 0800) Pulse Rate: 107 (05/10 0800)  Labs: Recent Labs    11/17/22 1438 11/17/22 1549 11/17/22 1838 11/18/22 0526  HGB 14.8  --   --  13.5  HCT 46.9  --   --  42.9  PLT 108*  --   --  103*  LABPROT  --  23.3*  --  26.3*  INR  --  2.0*  --  2.4*  CREATININE 1.11  --   --  1.10  TROPONINIHS  --  12 17  --      CrCl cannot be calculated (Unknown ideal weight.).   Medical History: Past Medical History:  Diagnosis Date   Aortic atherosclerosis (HCC) 03/09/2020   Noted on abdominal CT   Atrial fibrillation (HCC)    COVID-19 06/2019   DVT (deep venous thrombosis) (HCC)    Dyspnea    since covid dec 2020   Dysrhythmia    paroxysmal atrial fibrillation   HTN (hypertension)    Lab test positive for detection of COVID-19 virus 06/30/2019   Morbid obesity (HCC)    Parkinson disease    Pedal edema    Renal cyst, acquired, right    Thrombocytopenia (HCC)     Medications:  PTA warfarin regimen: 5 mg Friday, Sat, Sun; 7.5 mg all other days TWD= 45 mg   Assessment: 72 y.o. male with medical history significant for remote history of right lower extremity DVT, HTN. atrial fibrillation on warfarin regimen outlined above, presents to ED with generalized weakness, productive cough, congestion. INR 2.0 (therapeutic) on admission. Pharmacy has been consulted to manage warfarin therapy while patient admitted   DDI:  azithromycin 500 mg x1 5/9, ceftriaxone 5/9  Date INR Warfarin Dose 5/07 -- 7.5 mg (PTA) 5/08 -- 7.5 mg  (PTA) 5/09 2.0 7.5 mg  5/10 2.4 5.0 mg  Goal of Therapy:  INR 2-3 Monitor platelets by anticoagulation protocol: Yes   Plan:  INR remains therapeutic  Continue with home regimen of  5 mg tonight INR with AM  labs  Lowella Bandy, PharmD, BCPS Clinical Pharmacist   11/18/2022,10:04 AM

## 2022-11-18 NOTE — Evaluation (Signed)
Physical Therapy Evaluation Patient Details Name: Neil Bailey MRN: [id number] DOB: [birth year] Today's Date: 11/18/2022  History of Present Illness  Pt is a 72 y.o. male with medical history significant for atrial fibrillation on warfarin, remote history of right lower extremity DVT, hypertension, morbid obesity, Parkinson's disease, thrombocytopenia, aortic atherosclerosis, history of COVID-19 infection, who presented to the hospital with multiple complaints.  His complaints include gradually worsening general weakness, fatigue, lack of energy, unsteady gait, cough productive of greenish sputum, congestion, runny nose and sneezing.  On the day of admission, he was so weak that he laid down on the bedroom floor.  His wife found him on the floor and insisted that he come to the emergency department for further management.  He admits to feeling short of breath.  MD assessment includes: suspected sepsis from acute sinusitis, permanent atrial fibrillation with RVR, general weakness, and chronic thrombocytopenia.   Clinical Impression  Pt was pleasant and motivated to participate during the session and put forth good effort throughout. Pt required min extra time/effort during transfer training but was steady with good eccentric and concentric control.  Pt was able to amb both with a RW and with an AD with pt actually demonstrating improved cadence, posture, and step length without the RW compared to with it.  Pt declined further use of the RW and requested no DME to be obtained.  Pt reported no adverse symptoms during the session with SpO2 and HR WNL on room air.  Pt will benefit from continued PT services upon discharge to safely address deficits listed in patient problem list for decreased caregiver assistance and eventual return to PLOF.         Recommendations for follow up therapy are one component of a multi-disciplinary discharge planning process, led by the attending physician.  Recommendations  may be updated based on patient status, additional functional criteria and insurance authorization.  Follow Up Recommendations       Assistance Recommended at Discharge Intermittent Supervision/Assistance  Patient can return home with the following  A little help with walking and/or transfers;A little help with bathing/dressing/bathroom;Assistance with cooking/housework;Help with stairs or ramp for entrance;Assist for transportation    Equipment Recommendations Other (comment);None recommended by PT (Pt declined DME)  Recommendations for Other Services       Functional Status Assessment Patient has had a recent decline in their functional status and demonstrates the ability to make significant improvements in function in a reasonable and predictable amount of time.     Precautions / Restrictions Precautions Precautions: Fall Restrictions Weight Bearing Restrictions: No      Mobility  Bed Mobility               General bed mobility comments: NT, pt in recliner    Transfers Overall transfer level: Needs assistance Equipment used: None, Rolling walker (2 wheels) Transfers: Sit to/from Stand Sit to Stand: Supervision           General transfer comment: Good eccentric and concentric control and stability both with and without an AD    Ambulation/Gait Ambulation/Gait assistance: Supervision Gait Distance (Feet): 40 Feet Assistive device: Rolling walker (2 wheels), None Gait Pattern/deviations: Step-through pattern, Decreased step length - right, Decreased step length - left Gait velocity: decreased     General Gait Details: Pt able to amb both with and without an AD with no overt LOB and with improved cadence and step length without the RW  Stairs  Wheelchair Mobility    Modified Rankin (Stroke Patients Only)       Balance Overall balance assessment: Needs assistance   Sitting balance-Leahy Scale: Normal     Standing balance support:  During functional activity, No upper extremity supported, Bilateral upper extremity supported Standing balance-Leahy Scale: Good                               Pertinent Vitals/Pain Pain Assessment Pain Assessment: No/denies pain    Home Living Family/patient expects to be discharged to:: Private residence Living Arrangements: Spouse/significant other Available Help at Discharge: Family;Available 24 hours/day Type of Home: House Home Access: Stairs to enter Entrance Stairs-Rails: None Entrance Stairs-Number of Steps: 4   Home Layout: Two level;Able to live on main level with bedroom/bathroom Home Equipment: None      Prior Function Prior Level of Function : Independent/Modified Independent             Mobility Comments: Ind amb community distances without an AD, no fall history, per patient no fall related to this admission, was feeling light headed and purposfully laid down on the floor ADLs Comments: Ind with ADLs     Hand Dominance        Extremity/Trunk Assessment   Upper Extremity Assessment Upper Extremity Assessment: Generalized weakness    Lower Extremity Assessment Lower Extremity Assessment: Generalized weakness       Communication   Communication: No difficulties  Cognition Arousal/Alertness: Awake/alert Behavior During Therapy: WFL for tasks assessed/performed Overall Cognitive Status: Within Functional Limits for tasks assessed                                          General Comments      Exercises     Assessment/Plan    PT Assessment Patient needs continued PT services  PT Problem List Decreased strength;Decreased activity tolerance;Decreased balance;Decreased mobility       PT Treatment Interventions DME instruction;Gait training;Stair training;Functional mobility training;Therapeutic activities;Therapeutic exercise;Balance training;Patient/family education    PT Goals (Current goals can be found in the  Care Plan section)  Acute Rehab PT Goals Patient Stated Goal: To feel better PT Goal Formulation: With patient Time For Goal Achievement: 12/01/22 Potential to Achieve Goals: Good    Frequency Min 3X/week     Co-evaluation               AM-PAC PT "6 Clicks" Mobility  Outcome Measure Help needed turning from your back to your side while in a flat bed without using bedrails?: A Little Help needed moving from lying on your back to sitting on the side of a flat bed without using bedrails?: A Little Help needed moving to and from a bed to a chair (including a wheelchair)?: A Little Help needed standing up from a chair using your arms (e.g., wheelchair or bedside chair)?: A Little Help needed to walk in hospital room?: A Little Help needed climbing 3-5 steps with a railing? : A Little 6 Click Score: 18    End of Session Equipment Utilized During Treatment: Gait belt Activity Tolerance: Patient tolerated treatment well Patient left: in chair;with call bell/phone within reach Nurse Communication: Mobility status PT Visit Diagnosis: Difficulty in walking, not elsewhere classified (R26.2);Muscle weakness (generalized) (M62.81)    Time: 1610-9604 PT Time Calculation (min) (ACUTE ONLY): 32 min  Charges:   PT Evaluation $PT Eval Moderate Complexity: 1 Mod        D. Scott Santrice Muzio PT, DPT 11/18/22, 12:08 PM

## 2022-11-18 NOTE — TOC Initial Note (Signed)
Transition of Care [facility]) - Initial/Assessment Note    Patient Details  Name: Neil Bailey MRN: [id number] Date of Birth: 1951/04/30  Transition of [facility]) CM/SW Contact:    Darolyn Rua, LCSW Phone Number: 11/18/2022, 2:35 PM  Clinical Narrative:                  CSW met with patient at bedside to review home health recommendations made by PT/OT.   Patient reports he declines HH at this time as he does not feel like he needs it. Reports PT told him he walks better without a walker than with a walker, does not need dme.   Patient reports he continues to see PCP Dr. Hyacinth Meeker and [facility] [place]   Patient reports no discharge planning needs at this time.   Expected Discharge Plan: Home/Self Care Barriers to Discharge: Continued Medical Work up   Patient Goals and CMS Choice Patient states their goals for this hospitalization and ongoing recovery are:: to go home CMS Medicare.gov Compare Post Acute Care list provided to:: Patient Choice offered to / list presented to : Patient      Expected Discharge Plan and Services       Living arrangements for the past 2 months: Single Family Home                                      Prior Living Arrangements/Services Living arrangements for the past 2 months: Single Family Home Lives with:: Self                   Activities of Daily Living      Permission Sought/Granted                  Emotional Assessment              Admission diagnosis:  Atrial fibrillation with RVR (HCC) [I48.91] Patient Active Problem List   Diagnosis Date Noted   Atrial fibrillation with RVR (HCC) 11/17/2022   Sepsis (HCC) 11/17/2022   Acute sinusitis 11/17/2022   General weakness 11/17/2022   SOB (shortness of breath) on exertion 06/23/2020   Pedal edema 06/08/2020   Renal cyst, acquired, right 06/08/2020   Thrombocytopenia (HCC) 02/11/2020   Acute deep vein thrombosis (DVT) of popliteal vein of  right lower extremity (HCC) 07/18/2019   History of 2019 novel coronavirus disease (COVID-19) 07/18/2019   Acute respiratory failure with hypoxia (HCC) 07/04/2019   Atrial fibrillation, chronic (HCC) 07/04/2019   Pneumonia due to COVID-19 virus 07/01/2019   CAP (community acquired pneumonia) 06/30/2019   Medicare annual wellness visit, initial 03/20/2017   Morbid obesity with BMI of 40.0-44.9, adult (HCC) 12/29/2016   History of DVT (deep vein thrombosis) 10/17/2013   PCP:  Danella Penton, MD Pharmacy:   [phone] [place] [place] - [address] [place] [address] Phone: [phone] Fax: [phone]  [id number] [place], [place] - [address] [place] [address] Phone: [phone] Fax: [phone]     Social Determinants of Health (SDOH) Social History: SDOH Screenings   Tobacco Use: Medium Risk (11/17/2022)   SDOH Interventions:     Readmission Risk Interventions     No data to display

## 2022-11-19 DIAGNOSIS — B348 Other viral infections of unspecified site: Secondary | ICD-10-CM | POA: Diagnosis present

## 2022-11-19 LAB — PROTIME-INR
INR: 2.6 — ABNORMAL HIGH (ref 0.8–1.2)
Prothrombin Time: 28.1 seconds — ABNORMAL HIGH (ref 11.4–15.2)

## 2022-11-19 MED ORDER — AMOXICILLIN-POT CLAVULANATE 875-125 MG PO TABS: 1.0000 | ORAL_TABLET | Freq: Two times a day (BID) | ORAL | 0 refills | Status: AC

## 2022-11-19 MED ORDER — AMOXICILLIN-POT CLAVULANATE 875-125 MG PO TABS
1.0000 | ORAL_TABLET | Freq: Two times a day (BID) | ORAL | Status: DC
  Administered 2022-11-19: 1 via ORAL
  Filled 2022-11-19: qty 1

## 2022-11-19 NOTE — Discharge Summary (Addendum)
Physician Discharge Summary   Patient: Neil Bailey MRN: [id number] DOB: January 25, 1951  Admit date:     11/17/2022  Discharge date: 11/19/2022  Discharge Physician: Lurene Shadow   PCP: Danella Penton, MD   Recommendations at discharge:   Follow-up with PCP in 1 week   Discharge Diagnoses: Principal Problem:   Sepsis (HCC) Active Problems:   CAP (community acquired pneumonia)   Thrombocytopenia (HCC)   Atrial fibrillation with RVR (HCC)   Acute sinusitis   General weakness   Parainfluenza infection  Resolved Problems:   * No resolved hospital problems. [facility] Course:  Mr. Neil Bailey is a 72 y.o. male  with medical history significant for atrial fibrillation on warfarin, remote history of right lower extremity DVT, hypertension, morbid obesity, Parkinson's disease, thrombocytopenia, aortic atherosclerosis, history of COVID-19 infection, who presented to the hospital with multiple complaints.  His complaints include shortness of breath, general weakness, fatigue, lack of energy, unsteady gait, cough productive of greenish sputum, congestion, runny nose and sneezing.  Symptoms this started about 3 days prior to admission and have progressively worsened.  On the day of admission, he was so weak that he laid down on the bedroom floor.  His wife found him on the floor and insisted that he come to the emergency department for further management.       He was found to have sepsis secondary to community-acquired pneumonia and  acute sinusitis.      Assessment and Plan:  Sepsis secondary to community-acquired pneumonia and acute sinusitis: He is feeling much better.  He will be discharged on Augmentin to complete 5 days of treatment. Respiratory panel by PCR showed parainfluenza virus 3. CT chest showed left lower lobe pneumonia.     Permanent atrial fibrillation with RVR: Heart rate is better.  Continue metoprolol and warfarin.  INR is therapeutic at 2.6.      General weakness, fatigue, lack of energy: Improved.  This was likely from sepsis.  PT recommended home health therapy but he declined home health     Parkinson's disease: Continue levodopa-carbidopa     Hypertension: Continue Lasix and metoprolol     Chronic thrombocytopenia: Platelet count is stable    His condition has improved and he is deemed stable for discharge home today.      Consultants: None Procedures performed: None  Disposition: Home Diet recommendation:  Discharge Diet Orders (From admission, onward)     Start     Ordered   11/19/22 0000  Diet - low sodium heart healthy        11/19/22 0823           Cardiac diet DISCHARGE MEDICATION: Allergies as of 11/19/2022   No Known Allergies      Medication List     TAKE these medications    amoxicillin-clavulanate 875-125 MG tablet Commonly known as: AUGMENTIN Take 1 tablet by mouth every 12 (twelve) hours for 5 doses.   carbidopa-levodopa 25-100 MG tablet Commonly known as: SINEMET IR Take 1.5 tablets by mouth 2 (two) times daily.   celecoxib 200 MG capsule Commonly known as: CELEBREX Take 200 mg by mouth 2 (two) times daily.   furosemide 20 MG tablet Commonly known as: LASIX Take 20 mg by mouth daily.   metoprolol succinate 25 MG 24 hr tablet Commonly known as: TOPROL-XL Take 12.5 mg by mouth daily.   pantoprazole 40 MG tablet Commonly known as: PROTONIX Take 40 mg by mouth daily.   potassium chloride 10  MEQ tablet Commonly known as: KLOR-CON Take 10 mEq by mouth daily.   Sodium Fluoride 5000 PPM 1.1 % Pste Generic drug: Sodium Fluoride Take by mouth.   warfarin 5 MG tablet Commonly known as: COUMADIN Take 5-7.5 mg by mouth daily.        Discharge Exam:  GEN: NAD SKIN: Warm and dry EYES: EOMI ENT: MMM CV: RRR PULM: Left basilar rales.  No wheezing ABD: soft, obese, NT, +BS CNS: AAO x 3, non focal EXT: No edema or tenderness   Condition at discharge: good  The  results of significant diagnostics from this hospitalization (including imaging, microbiology, ancillary and laboratory) are listed below for reference.   Imaging Studies: CT CHEST WO CONTRAST  Result Date: 11/17/2022 CLINICAL DATA:  Weakness EXAM: CT CHEST WITHOUT CONTRAST TECHNIQUE: Multidetector CT imaging of the chest was performed following the standard protocol without IV contrast. RADIATION DOSE REDUCTION: This exam was performed according to the departmental dose-optimization program which includes automated exposure control, adjustment of the mA and/or kV according to patient size and/or use of iterative reconstruction technique. COMPARISON:  Chest x-ray 11/17/2022 FINDINGS: Cardiovascular: Limited evaluation without intravenous contrast. Mild atherosclerosis. No aneurysm. Cardiomegaly. No pericardial effusion. Mediastinum/Nodes: Midline trachea. No thyroid mass. Subcentimeter mediastinal lymph nodes. Esophagus within normal limits. Lungs/Pleura: Mild apical emphysema. Consolidation and ground-glass density in the left lower lobe. No pleural effusion or pneumothorax. Upper Abdomen: No acute finding Musculoskeletal: No acute or suspicious osseous abnormality IMPRESSION: 1. Consolidation and ground-glass density in the left lower lobe consistent with pneumonia. Imaging follow-up to resolution is recommended. 2. Cardiomegaly. Aortic Atherosclerosis (ICD10-I70.0) and Emphysema (ICD10-J43.9). Electronically Signed   By: Jasmine Pang M.D.   On: 11/17/2022 18:43   CT HEAD WO CONTRAST  Result Date: 11/17/2022 CLINICAL DATA:  Head trauma EXAM: CT HEAD WITHOUT CONTRAST TECHNIQUE: Contiguous axial images were obtained from the base of the skull through the vertex without intravenous contrast. RADIATION DOSE REDUCTION: This exam was performed according to the departmental dose-optimization program which includes automated exposure control, adjustment of the mA and/or kV according to patient size and/or use of  iterative reconstruction technique. COMPARISON:  Brain MRI 10/03/2021 FINDINGS: Brain: There is no acute intracranial hemorrhage, extra-axial fluid collection, or acute infarct There is background parenchymal volume loss with prominence of the ventricular system and extra-axial CSF spaces, unchanged. The ventricles are stable in size compared to the prior brain MRI. Patchy and confluent hypodensity in the supratentorial white matter likely reflects sequela of chronic small-vessel ischemic change, stable. The pituitary and suprasellar region are normal. There is no mass lesion. There is no mass effect or midline shift. Vascular: No hyperdense vessel or unexpected calcification. Skull: Normal. Negative for fracture or focal lesion. Sinuses/Orbits: There is extensive mucosal thickening in the paranasal sinuses with layering fluid in the bilateral maxillary and left sphenoid sinuses. There is surrounding hyperostosis consistent with chronic sinusitis. Bilateral lens implants are in place. The globes and orbits are otherwise unremarkable. Other: None. IMPRESSION: 1. No acute intracranial pathology. 2. Extensive mucosal thickening in the paranasal sinuses with layering fluid which could reflect acute sinusitis in the correct clinical setting. Electronically Signed   By: Lesia Hausen M.D.   On: 11/17/2022 16:31   DG Chest Port 1 View  Result Date: 11/17/2022 CLINICAL DATA:  Sepsis. EXAM: PORTABLE CHEST 1 VIEW COMPARISON:  July 05, 2019. FINDINGS: Stable cardiomegaly. Both lungs are clear. The visualized skeletal structures are unremarkable. IMPRESSION: No active disease. Electronically Signed   By: Fayrene Fearing  Christen Butter M.D.   On: 11/17/2022 16:17    Microbiology: Results for orders placed or performed during the hospital encounter of 11/17/22  Blood Culture (routine x 2)     Status: None (Preliminary result)   Collection Time: 11/17/22  3:32 PM   Specimen: BLOOD  Result Value Ref Range Status   Specimen  Description BLOOD BLOOD LEFT ARM  Final   Special Requests   Final    BOTTLES DRAWN AEROBIC AND ANAEROBIC Blood Culture results may not be optimal due to an inadequate volume of blood received in culture bottles   Culture   Final    NO GROWTH 2 DAYS Performed at [facility], [address] [place], [place] [address]    Report Status PENDING  Incomplete  Blood Culture (routine x 2)     Status: None (Preliminary result)   Collection Time: 11/17/22  3:37 PM   Specimen: BLOOD  Result Value Ref Range Status   Specimen Description BLOOD RIGHT ANTECUBITAL  Final   Special Requests   Final    BOTTLES DRAWN AEROBIC AND ANAEROBIC Blood Culture adequate volume   Culture   Final    NO GROWTH 2 DAYS Performed at [facility], [address] [place], [place] [address]    Report Status PENDING  Incomplete  Respiratory (~20 pathogens) panel by PCR     Status: Abnormal   Collection Time: 11/17/22  6:38 PM   Specimen: Nasopharyngeal Swab; Respiratory  Result Value Ref Range Status   Adenovirus NOT DETECTED NOT DETECTED Final   Coronavirus 229E NOT DETECTED NOT DETECTED Final    Comment: (NOTE) The Coronavirus on the Respiratory Panel, DOES NOT test for the novel  Coronavirus (2019 nCoV)    Coronavirus HKU1 NOT DETECTED NOT DETECTED Final   Coronavirus NL63 NOT DETECTED NOT DETECTED Final   Coronavirus OC43 NOT DETECTED NOT DETECTED Final   Metapneumovirus NOT DETECTED NOT DETECTED Final   Rhinovirus / Enterovirus NOT DETECTED NOT DETECTED Final   Influenza A NOT DETECTED NOT DETECTED Final   Influenza B NOT DETECTED NOT DETECTED Final   Parainfluenza Virus 1 NOT DETECTED NOT DETECTED Final   Parainfluenza Virus 2 NOT DETECTED NOT DETECTED Final   Parainfluenza Virus 3 DETECTED (A) NOT DETECTED Final   Parainfluenza Virus 4 NOT DETECTED NOT DETECTED Final   Respiratory Syncytial Virus NOT DETECTED NOT DETECTED Final   Bordetella pertussis NOT DETECTED NOT DETECTED Final    Bordetella Parapertussis NOT DETECTED NOT DETECTED Final   Chlamydophila pneumoniae NOT DETECTED NOT DETECTED Final   Mycoplasma pneumoniae NOT DETECTED NOT DETECTED Final    Comment: Performed at [facility], [address] [place], [place] [address]  SARS Coronavirus 2 by RT PCR (hospital order, performed in [facility] hospital lab) *cepheid single result test* Nasopharyngeal Swab     Status: None   Collection Time: 11/17/22  6:38 PM   Specimen: Nasopharyngeal Swab; Nasal Swab  Result Value Ref Range Status   SARS Coronavirus 2 by RT PCR NEGATIVE NEGATIVE Final    Comment: (NOTE) SARS-CoV-2 target nucleic acids are NOT DETECTED.  The SARS-CoV-2 RNA is generally detectable in upper and lower respiratory specimens during the acute phase of infection. The lowest concentration of SARS-CoV-2 viral copies this assay can detect is 250 copies / mL. A negative result does not preclude SARS-CoV-2 infection and should not be used as the sole basis for treatment or other patient management decisions.  A negative result may  occur with improper specimen collection / handling, submission of specimen other than nasopharyngeal swab, presence of viral mutation(s) within the areas targeted by this assay, and inadequate number of viral copies (<250 copies / mL). A negative result must be combined with clinical observations, patient history, and epidemiological information.  Fact Sheet for Patients:   RoadLapTop.co.za  Fact Sheet for Healthcare Providers: http://kim-miller.com/  This test is not yet approved or  cleared by the [place] FDA and has been authorized for detection and/or diagnosis of SARS-CoV-2 by FDA under an Emergency Use Authorization (EUA).  This EUA will remain in effect (meaning this test can be used) for the duration of the COVID-19 declaration under Section 564(b)(1) of the Act, 21 U.S.C. section 360bbb-3(b)(1), unless  the authorization is terminated or revoked sooner.  Performed at [facility], [address] [place], [place] [address]     Labs: CBC: Recent Labs  Lab 11/17/22 1438 11/18/22 0526  WBC 13.6* 9.3  NEUTROABS  --  6.8  HGB 14.8 13.5  HCT 46.9 42.9  MCV 86.2 87.4  PLT 108* 103*   Basic Metabolic Panel: Recent Labs  Lab 11/17/22 1438 11/18/22 0526  NA 137 137  K 3.6 3.7  CL 104 106  CO2 22 23  GLUCOSE 119* 102*  BUN 16 16  CREATININE 1.11 1.10  CALCIUM 8.4* 7.8*  MG 2.0  --   PHOS 1.6*  --    Liver Function Tests: Recent Labs  Lab 11/17/22 1549  AST 18  ALT 14  ALKPHOS 53  BILITOT 1.4*  PROT 7.5  ALBUMIN 4.1   CBG: No results for input(s): "GLUCAP" in the last 168 hours.  Discharge time spent: greater than 30 minutes.  Signed: Lurene Shadow, MD [facility] 11/19/2022

## 2022-11-19 NOTE — Consult Note (Signed)
ANTICOAGULATION CONSULT NOTE  Pharmacy Consult for warfarin Indication: atrial fibrillation  No Known Allergies  Patient Measurements: Height: 5\' 9"  (175.3 cm) Weight: 124.7 kg (275 lb) IBW/kg (Calculated) : 70.7   Vital Signs: Temp: 98.1 F (36.7 C) (05/11 0602) Temp Source: Oral (05/11 0602) BP: 135/86 (05/11 0730) Pulse Rate: 109 (05/11 0730)  Labs: Recent Labs    11/17/22 1438 11/17/22 1549 11/17/22 1838 11/18/22 0526 11/19/22 0556  HGB 14.8  --   --  13.5  --   HCT 46.9  --   --  42.9  --   PLT 108*  --   --  103*  --   LABPROT  --  23.3*  --  26.3* 28.1*  INR  --  2.0*  --  2.4* 2.6*  CREATININE 1.11  --   --  1.10  --   TROPONINIHS  --  12 17  --   --      Estimated Creatinine Clearance: 79.2 mL/min (by C-G formula based on SCr of 1.1 mg/dL).   Medical History: Past Medical History:  Diagnosis Date   Aortic atherosclerosis (HCC) 03/09/2020   Noted on abdominal CT   Atrial fibrillation (HCC)    COVID-19 06/2019   DVT (deep venous thrombosis) (HCC)    Dyspnea    since covid dec 2020   Dysrhythmia    paroxysmal atrial fibrillation   HTN (hypertension)    Lab test positive for detection of COVID-19 virus 06/30/2019   Morbid obesity (HCC)    Parkinson disease    Pedal edema    Renal cyst, acquired, right    Thrombocytopenia (HCC)     Medications:  PTA warfarin regimen: 5 mg Friday, Sat, Sun; 7.5 mg all other days TWD= 45 mg   Assessment: 72 y.o. male with medical history significant for remote history of right lower extremity DVT, HTN. atrial fibrillation on warfarin regimen outlined above, presents to ED with generalized weakness, productive cough, congestion. INR 2.0 (therapeutic) on admission. Pharmacy has been consulted to manage warfarin therapy while patient admitted   DDI:  azithromycin 500 mg x1 5/9, ceftriaxone 5/9  Date INR Warfarin Dose 5/07 -- 7.5 mg (PTA) 5/08 -- 7.5 mg  (PTA) 5/09 2.0 7.5 mg  5/10 2.4 5 mg 5/11 2.6 5  mg   Goal of Therapy:  INR 2-3 Monitor platelets by anticoagulation protocol: Yes   Plan:  INR remains therapeutic  Continue with home regimen of Warfarin 5 mg today (Currently ordered Warfarin 5 mg Fri,Sat, Sun  and 7.5 mg Mon,Tues,Wed, Thur)-home regimen INR with AM labs  Angelique Blonder, PharmD Clinical Pharmacist   11/19/2022,9:18 AM

## 2022-11-20 LAB — CULTURE, BLOOD (ROUTINE X 2)
Culture: NO GROWTH
Special Requests: ADEQUATE

## 2022-11-21 LAB — CULTURE, BLOOD (ROUTINE X 2)

## 2022-11-22 LAB — CULTURE, BLOOD (ROUTINE X 2)

## 2023-01-10 ENCOUNTER — Emergency Department: Payer: Medicare HMO

## 2023-01-10 ENCOUNTER — Other Ambulatory Visit: Payer: Self-pay

## 2023-01-10 ENCOUNTER — Encounter: Payer: Self-pay | Admitting: Emergency Medicine

## 2023-01-10 ENCOUNTER — Emergency Department
Admission: EM | Admit: 2023-01-10 | Discharge: 2023-01-10 | Disposition: A | Discharge status: Home / Self Care | Payer: Medicare HMO | Attending: Emergency Medicine | Admitting: Emergency Medicine

## 2023-01-10 DIAGNOSIS — I10 Essential (primary) hypertension: Secondary | ICD-10-CM | POA: Diagnosis not present

## 2023-01-10 DIAGNOSIS — Z7901 Long term (current) use of anticoagulants: Secondary | ICD-10-CM | POA: Diagnosis not present

## 2023-01-10 DIAGNOSIS — R059 Cough, unspecified: Secondary | ICD-10-CM | POA: Diagnosis not present

## 2023-01-10 DIAGNOSIS — Z20822 Contact with and (suspected) exposure to covid-19: Secondary | ICD-10-CM | POA: Insufficient documentation

## 2023-01-10 DIAGNOSIS — I480 Paroxysmal atrial fibrillation: Secondary | ICD-10-CM | POA: Diagnosis not present

## 2023-01-10 DIAGNOSIS — D72829 Elevated white blood cell count, unspecified: Secondary | ICD-10-CM | POA: Insufficient documentation

## 2023-01-10 DIAGNOSIS — R531 Weakness: Secondary | ICD-10-CM

## 2023-01-10 DIAGNOSIS — G20A1 Parkinson's disease without dyskinesia, without mention of fluctuations: Secondary | ICD-10-CM | POA: Insufficient documentation

## 2023-01-10 DIAGNOSIS — I4891 Unspecified atrial fibrillation: Secondary | ICD-10-CM

## 2023-01-10 LAB — BASIC METABOLIC PANEL
Anion gap: 11 (ref 5–15)
BUN: 27 mg/dL — ABNORMAL HIGH (ref 8–23)
CO2: 24 mmol/L (ref 22–32)
Calcium: 8.8 mg/dL — ABNORMAL LOW (ref 8.9–10.3)
Chloride: 97 mmol/L — ABNORMAL LOW (ref 98–111)
Creatinine, Ser: 1.31 mg/dL — ABNORMAL HIGH (ref 0.61–1.24)
GFR, Estimated: 58 mL/min — ABNORMAL LOW (ref >60)
Glucose, Bld: 150 mg/dL — ABNORMAL HIGH (ref 70–99)
Potassium: 3.2 mmol/L — ABNORMAL LOW (ref 3.5–5.1)
Sodium: 132 mmol/L — ABNORMAL LOW (ref 135–145)

## 2023-01-10 LAB — TROPONIN I (HIGH SENSITIVITY): Troponin I (High Sensitivity): 10 ng/L (ref <18)

## 2023-01-10 LAB — SARS CORONAVIRUS 2 BY RT PCR: SARS Coronavirus 2 by RT PCR: NEGATIVE

## 2023-01-10 LAB — CBC
HCT: 49.7 % (ref 39.0–52.0)
Hemoglobin: 15.9 g/dL (ref 13.0–17.0)
MCH: 27.9 pg (ref 26.0–34.0)
MCHC: 32 g/dL (ref 30.0–36.0)
MCV: 87.2 fL (ref 80.0–100.0)
Platelets: 78 10*3/uL — ABNORMAL LOW (ref 150–400)
RBC: 5.7 MIL/uL (ref 4.22–5.81)
RDW: 15.9 % — ABNORMAL HIGH (ref 11.5–15.5)
WBC: 15.8 10*3/uL — ABNORMAL HIGH (ref 4.0–10.5)
nRBC: 0 % (ref 0.0–0.2)

## 2023-01-10 LAB — BRAIN NATRIURETIC PEPTIDE: B Natriuretic Peptide: 94 pg/mL (ref 0.0–100.0)

## 2023-01-10 MED ORDER — METOPROLOL TARTRATE 5 MG/5ML IV SOLN
5.0000 mg | Freq: Once | INTRAVENOUS | Status: AC
  Administered 2023-01-10: 5 mg via INTRAVENOUS
  Filled 2023-01-10: qty 5

## 2023-01-10 MED ORDER — SODIUM CHLORIDE 0.9 % IV BOLUS
1000.0000 mL | Freq: Once | INTRAVENOUS | Status: AC
  Administered 2023-01-10: 1000 mL via INTRAVENOUS

## 2023-01-10 MED ORDER — METOPROLOL SUCCINATE ER 25 MG PO TB24: 25.0000 mg | ORAL_TABLET | Freq: Every day | ORAL | 0 refills | Status: DC

## 2023-01-10 NOTE — ED Provider Notes (Signed)
[facility] Provider Note    Event Date/Time   First MD Initiated Contact with Patient 01/10/23 [phone]     (approximate)  History   Chief Complaint: Dizziness and Cough  HPI  Neil Bailey is a 72 y.o. male with a past medical history of paroxysmal atrial fibrillation, hypertension, Parkinson's, presents to the emergency department for generalized weakness.  According to the patient he was getting up to use restroom overnight when he became weak and lightheaded.  States he had to sit down on the ground so he did not pass out.  Patient denies any chest pain.  Patient states he feels like he is back in atrial fibrillation which she states does happen from time to time.  He states last time he felt like this he had pneumonia.  States he has had increased cough and congestion over the past 2 days in fact called his doctor yesterday due to the increased cough and was prescribed antibiotic which she took 1 dose of so far.  Patient denies any fever at any point.  Physical Exam   Triage Vital Signs: ED Triage Vitals  Enc Vitals Group     BP 01/10/23 0429 108/61     Pulse Rate 01/10/23 0429 (!) 117     Resp 01/10/23 0429 18     Temp 01/10/23 0429 99.3 F (37.4 C)     Temp Source 01/10/23 0429 Oral     SpO2 01/10/23 0429 100 %     Weight 01/10/23 0428 280 lb (127 kg)     Height 01/10/23 0428 5\' 9"  (1.753 m)     Head Circumference --      Peak Flow --      Pain Score 01/10/23 0428 0     Pain Loc --      Pain Edu? --      Excl. in GC? --     Most recent vital signs: Vitals:   01/10/23 0429  BP: 108/61  Pulse: (!) 117  Resp: 18  Temp: 99.3 F (37.4 C)  SpO2: 100%    General: Awake, no distress.  CV:  Good peripheral perfusion.  Irregular rhythm rate of 110 bpm. Resp:  Normal effort.  Equal breath sounds bilaterally.  No wheeze rales or rhonchi. Abd:  No distention.  Soft, nontender.  No rebound or guarding.  ED Results / Procedures / Treatments    EKG  EKG viewed and interpreted by myself shows atrial fibrillation 111 bpm with a narrow QRS, normal axis, normal intervals nonspecific ST changes.  RADIOLOGY  I have reviewed and interpreted the chest x-ray images shows no consolidation my evaluation. Radiology read the x-ray as negative.   MEDICATIONS ORDERED IN ED: Medications  sodium chloride 0.9 % bolus 1,000 mL (has no administration in time range)     IMPRESSION / MDM / ASSESSMENT AND PLAN / ED COURSE  I reviewed the triage vital signs and the nursing notes.  Patient's presentation is most consistent with acute presentation with potential threat to life or bodily function.  Patient presents emergency department for generalized weakness.  States he was going to the restroom when he felt weak and lightheaded so he sat down so he would not pass out.  Patient denies any LOC.  Patient is on warfarin for paroxysmal atrial fibrillation.  EKG confirms atrial fibrillation currently around 100 to 110 bpm.  Given the patient's increased cough we will check labs we will obtain a chest x-ray we will obtain  a COVID swab.  Will IV hydrate and continue to closely monitor in the emergency department.  Patient agreeable to plan of care.  Patient's labs have resulted showing mild leukocytosis with a white blood cell count of 15,000.  Chemistry shows mild renal insufficiency not significantly changed from baseline.  Patient receiving IV fluids.  Troponin reassuringly negative.  COVID test is negative.  Chest x-ray is clear.  Patient states he is feeling much better and he is ready to go home.  Patient states he thinks he stopped his metoprolol but he is not sure.  I discussed with the patient I will write him for a new metoprolol prescription and he will check when he gets home.  If he still has it he will not fill this prescription if he does not he will fill it and begin taking it.  Patient will follow-up with his doctor.  Patient was prescribed  antibiotic by his doctor for the cough which she will finish the course although no pneumonia seen on our evaluation and COVID test is negative.  FINAL CLINICAL IMPRESSION(S) / ED DIAGNOSES   Weakness Atrial fibrillation   Note:  This document was prepared using Dragon voice recognition software and may include unintentional dictation errors.   Minna Antis, MD 01/10/23 [phone]

## 2023-01-10 NOTE — Discharge Instructions (Addendum)
As we discussed please check your medications when you get home.  If you are not currently taking metoprolol please fill and begin taking your prescription.  Please drink plenty of fluids over the next couple days and follow-up with your primary care doctor within the next 1 to 2 days for recheck/reevaluation.  Return to the emergency department for any further episodes of weakness any chest pain shortness of breath or any other symptom personally concerning to yourself.

## 2023-01-10 NOTE — ED Triage Notes (Signed)
Pt arrived via POV with reports of falling when getting up to the bathroom, states he felt dizzy and has had issues with afib, pt also reports cough and congestion for several days.  Pt denies any injury or pain when falling, wife states the patient slid down.

## 2023-03-30 ENCOUNTER — Ambulatory Visit: Payer: Medicare HMO | Admitting: Urology

## 2023-04-05 ENCOUNTER — Ambulatory Visit: Payer: Medicare HMO | Admitting: Urology

## 2023-04-05 ENCOUNTER — Encounter: Payer: Self-pay | Admitting: Urology

## 2023-04-05 VITALS — BP 120/78 | HR 71 | Ht 69.0 in | Wt 273.0 lb

## 2023-04-05 DIAGNOSIS — N3281 Overactive bladder: Secondary | ICD-10-CM

## 2023-04-05 DIAGNOSIS — Z87438 Personal history of other diseases of male genital organs: Secondary | ICD-10-CM | POA: Diagnosis not present

## 2023-04-05 DIAGNOSIS — Z125 Encounter for screening for malignant neoplasm of prostate: Secondary | ICD-10-CM

## 2023-04-05 LAB — BLADDER SCAN AMB NON-IMAGING

## 2023-04-05 NOTE — Patient Instructions (Signed)

## 2023-04-05 NOTE — Progress Notes (Addendum)
04/05/2023 11:03 AM   Neil Bailey 1951/01/26 [id number]  Reason for visit: Follow up right hydrocele, scrotal swelling, new OAB symptoms   HPI: Comorbid 72 year old male who underwent a right-sided hydrocelectomy in January 2022.  This was very challenging with a thick and fibrotic rind.  He has healed very well since that time and really denies any problems with scrotal swelling or thickening.  He had a scrotal ultrasound in October 2022 for follow-up that showed a small left varicocele and small left hydrocele, but no other abnormalities.    He reports about 1 to 2 years of some urinary urgency and frequency.  This correlated with changes in his blood pressure medications including Lasix and hydrochlorothiazide.  He also drinks primarily tea during the day.  PVR today normal at 7 ml. Urinalysis has been benign, most recent PSA normal at 3.46 from December 2023.  He also has Parkinson's and we discussed the impact on overactive bladder.  He is not bothered enough at this time to consider medications.  We reviewed behavioral strategies including avoiding bladder irritants, timing diuretics, and timed voiding.  He would like to follow-up as needed at this point No further PSA screening recommended per guideline recommendation  Sondra Come, MD  [facility] [address] [place], [place] [address] [phone]
